# Patient Record
Sex: Male | Born: 1950 | Race: Black or African American | Hispanic: No | Marital: Married | Smoking: Former smoker
Health system: Southern US, Community
[De-identification: ages and names within clinical notes are randomized; demographics above are authoritative.]

## PROBLEM LIST (undated history)

## (undated) DIAGNOSIS — I2510 Atherosclerotic heart disease of native coronary artery without angina pectoris: Secondary | ICD-10-CM

## (undated) DIAGNOSIS — Z7901 Long term (current) use of anticoagulants: Secondary | ICD-10-CM

## (undated) DIAGNOSIS — F419 Anxiety disorder, unspecified: Secondary | ICD-10-CM

## (undated) DIAGNOSIS — M1990 Unspecified osteoarthritis, unspecified site: Secondary | ICD-10-CM

## (undated) DIAGNOSIS — I639 Cerebral infarction, unspecified: Secondary | ICD-10-CM

## (undated) DIAGNOSIS — I10 Essential (primary) hypertension: Secondary | ICD-10-CM

## (undated) DIAGNOSIS — R001 Bradycardia, unspecified: Secondary | ICD-10-CM

## (undated) DIAGNOSIS — K219 Gastro-esophageal reflux disease without esophagitis: Secondary | ICD-10-CM

## (undated) DIAGNOSIS — N529 Male erectile dysfunction, unspecified: Secondary | ICD-10-CM

## (undated) DIAGNOSIS — D126 Benign neoplasm of colon, unspecified: Secondary | ICD-10-CM

## (undated) DIAGNOSIS — A809 Acute poliomyelitis, unspecified: Secondary | ICD-10-CM

## (undated) DIAGNOSIS — M9909 Segmental and somatic dysfunction of abdomen and other regions: Secondary | ICD-10-CM

## (undated) HISTORY — DX: Long term (current) use of anticoagulants: Z79.01

## (undated) HISTORY — DX: Atherosclerotic heart disease of native coronary artery without angina pectoris: I25.10

## (undated) HISTORY — DX: Essential (primary) hypertension: I10

## (undated) HISTORY — DX: Benign neoplasm of colon, unspecified: D12.6

## (undated) HISTORY — DX: Acute poliomyelitis, unspecified: A80.9

## (undated) HISTORY — DX: Bradycardia, unspecified: R00.1

## (undated) HISTORY — DX: Cerebral infarction, unspecified: I63.9

## (undated) HISTORY — DX: Unspecified osteoarthritis, unspecified site: M19.90

## (undated) HISTORY — DX: Segmental and somatic dysfunction of abdomen and other regions: M99.09

## (undated) HISTORY — DX: Male erectile dysfunction, unspecified: N52.9

---

## 2000-10-16 DIAGNOSIS — D126 Benign neoplasm of colon, unspecified: Secondary | ICD-10-CM

## 2000-10-16 HISTORY — PX: COLONOSCOPY: SHX174

## 2000-10-16 HISTORY — DX: Benign neoplasm of colon, unspecified: D12.6

## 2008-06-09 ENCOUNTER — Ambulatory Visit: Payer: Self-pay | Admitting: Cardiology

## 2008-06-12 ENCOUNTER — Ambulatory Visit: Payer: Self-pay | Admitting: Cardiology

## 2008-07-14 ENCOUNTER — Ambulatory Visit: Payer: Self-pay | Admitting: Cardiology

## 2011-02-28 NOTE — Assessment & Plan Note (Signed)
[facility]                          [facility]   George Matthews, George Matthews                        MRN:          [id number]  DATE:07/14/2008                            DOB:          Aug 14, 1951    PRIMARY CARDIOLOGIST:  Jonelle Sidle, MD   REASON FOR VISIT:  Scheduled followup.  Please refer to Dr. Ival Bible  initial consultation note of June 09, 2008, for complete details.   At that time, George Matthews presented for an initial cardiac evaluation in  the setting of several cardiac risk factors, but no prior history of  heart disease.  His symptoms were deemed atypical and, given that he had  had no prior diagnostic ischemic testing, he was referred for an  exercise stress test.  The patient exercised for 7 minutes, had no  associated chest discomfort or significant EKG changes, and there was no  definite evidence of ischemia by perfusion imaging.  Left ventricular  function was normal (EF 60%), with normal wall motion.  There was a  noted small, fixed inferobasal defect consistent with attenuation.   Clinically, George Matthews seems to be doing better.  He has been taking  Prilosec on a p.r.n. basis, as well as having elevated his head on 2  pillows at night.  His symptoms seem to occur only when lying down at  night.  He denies any exertional chest discomfort.   CURRENT MEDICATIONS:  1. Benicar HCT 20/4.5 daily.  2. Crestor 10 daily.  3. Fish oil 1000 q.o.d.  4. Aspirin 81 daily.   PHYSICAL EXAMINATION:  VITAL SIGNS:  Blood pressure 164/88, pulse 64 and  regular, weight 230.  GENERAL:  A 60 year old male, sitting upright, no distress.  HEENT:  Normocephalic, atraumatic.  NECK:  Palpable carotid pulses with soft left supraclavicular bruits.  No JVD.  LUNGS:  Clear to auscultation in all fields.  HEART:  Regular rate and rhythm.  No significant murmurs.  No rubs.  ABDOMEN:  Soft, nontender, intact bowel sounds.  EXTREMITIES:  No  pedal edema.  Brisk bilateral radial pulses.  NEURO:  No focal deficit.   IMPRESSION:  1. Atypical chest pain.      a.     Negative, adequate exercise stress Cardiolite; EF 60%.  2. Hypertension, uncontrolled.  3. Status post remote right eye stroke.  4. Dyslipidemia.   PLAN:  The patient is advised to follow up with Dr. Samuel Jester later  this week, as scheduled, with close monitoring of his blood pressure.  We discussed the importance of treating this with respect to increased  risk of stroke and heart attack.  We will defer further recommendations  regarding either up titration of current dose of Benicar, or addition of  a second agent, to Dr. Charm Barges.  From a cardiac standpoint, no further  workup is indicated.  I did, however, also discuss with George Matthews  whether or not he has had a recent carotid ultrasound.  He apparently  has had a prior study, given his remote stroke.  He is also to review  this with Dr.  Charm Barges, at time of his visit later this week.  The patient  is to otherwise return to Dr. Jonelle Sidle on an as needed basis.  Continued aggressive risk factor modification is recommended.      Rozell Searing, PA-C  Electronically Signed      Jonelle Sidle, MD  Electronically Signed   GS/MedQ  DD: 07/14/2008  DT: 07/15/2008  Job #: [id number]   cc:   Samuel Jester

## 2011-02-28 NOTE — Assessment & Plan Note (Signed)
[facility]                          [facility] NOTE   JAYSHUN, GALENTINE                        MRN:          [id number]  DATE:06/09/2008                            DOB:          06-Apr-1951    REFERRING PHYSICIAN:  Samuel Jester   REASON FOR VISIT:  Cardiac evaluation.   HISTORY OF PRESENT ILLNESS:  Mr. Antonelli is a pleasant 60 year old male  with a history of hypertension, previous stroke, hyperlipidemia, and no  documented history of cardiovascular disease.  He has been on medical  therapy only recently over the last few months.  He reports to me a 5-6  month history of occasional fluttering feeling in the chest as well as  a feeling of indigestion describing a burning/tightness in his  epigastric to lower sternal area.  This appears to be fairly sporadic  and he does not note any specific agitation with exertion.  He does have  baseline dyspnea on exertion at NYHA class II.  He has taken some over-  the-counter Prilosec and noted a general improvement in some of his  indigestion although not all of his symptoms.  He chews tobacco, but  does not smoke cigarettes.  His electrocardiogram shows sinus  bradycardia at 54 beats per minutes with early repolarization changes.  There is no old tracing for comparison.  Looking at his blood work, he  had an LDL of 119 back in July with a BUN and creatinine of 10 and 1.0  respectively.  TSH of 1.19 and a potassium of 5.2.  Hemoglobin was 14.4.  He has not undergone any prior cardiac risk stratification and we spoke  about this some today.   ALLERGIES:  No known drug allergies.   MEDICATIONS:  1. Benicar HCT 20/12.5 mg p.o. daily.  2. Crestor 10 mg p.o. daily.  3. Vitamin D 1.25 mg p.o. daily.  4. Omega-3 supplements 1000 mg p.o. daily.  5. Prilosec OTC p.r.n.   REVIEW OF SYSTEMS:  As described in the history of present illness.  He  does not report any claudication.  He has some general  weakness in his  right leg due to a childhood history of polio.  He has no prolonged  sense of palpitations.  He has had no dizziness or syncope.  Otherwise,  negative.   PAST MEDICAL HISTORY:  Detailed above.  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  He states that it was  recommended he take an aspirin daily, but he has not been doing this.  He has what sounds like a fairly recently documented history of  hypertension and hyperlipidemia.  Also previous back operation.   SOCIAL HISTORY:  The patient is married.  He chews tobacco, although  denies any cigarette use.  He drinks 2-3 cups of caffeinated coffee  daily.  No alcohol use.   Family history was reviewed.  The patient states that his father died at  age 47 with a heart attack as well as a sister at age 63.  Otherwise, no  diabetes.   PHYSICAL EXAMINATION:  VITAL SIGNS:  On examination, blood pressure  148/85, heart rate is 52, weight is 229 pounds.  GENERAL:  The patient is in no acute distress, appearing somewhat older  than stated age.  HEENT:  Conjunctiva was normal.  Oropharynx clear.  NECK:  Supple.  No elevated jugular venous pressure.  No loud carotid  bruits.  No thyromegaly.  LUNGS:  Clear without labored breathing.  CARDIAC:  Exam was a regular rate and rhythm.  Soft systolic murmur at  the base.  No S3, gallop, or pericardial rub.  ABDOMEN:  Soft, nontender.  No obvious hepatomegaly.  EXTREMITIES:  Exhibit no significant pitting edema.  Musculature of the  right lower leg less well-developed than the left.  Distal pulses were 1  to 2+.  SKIN:  Warm and dry.  MUSCULOSKELETAL:  No kyphosis noted.  NEUROPSYCHIATRIC:  The patient was alert x3.  Affect is normal.   IMPRESSION AND RECOMMENDATIONS:  A 60 year old male with a remote  history of stroke and fairly recently diagnosed hypertension and  hyperlipidemia.  His electrocardiogram is rather nonspecific.  He is  having some chest  pain symptoms described as indigestion, although not  entirely resolved with PPI therapy, and he has undergone no prior  cardiac risk stratification.  I have asked him to start taking a coated  baby aspirin daily and we will refer him for an exercise Cardiolite for  risk stratification.  I will have him return to the office to review  this and we can determine the next step.     Jonelle Sidle, MD  Electronically Signed    SGM/MedQ  DD: 06/09/2008  DT: 06/10/2008  Job #: [id number]   cc:   Samuel Jester

## 2012-08-09 ENCOUNTER — Telehealth: Payer: Self-pay

## 2012-08-09 NOTE — Telephone Encounter (Signed)
LMOM to call.

## 2012-08-15 ENCOUNTER — Telehealth: Payer: Self-pay | Admitting: *Deleted

## 2012-08-15 NOTE — Telephone Encounter (Signed)
Mr Swamy called returning your call. Please try his cell phone first when calling him back. Thanks.

## 2012-08-15 NOTE — Telephone Encounter (Signed)
Pt is scheduled for OV on 08/20/2012 at 8:30 Am with LL. ( Hx of tubular adenoma in 2002 and was supposed to have another one in TCS in 5 years.

## 2012-08-19 ENCOUNTER — Other Ambulatory Visit: Payer: Self-pay

## 2012-08-20 ENCOUNTER — Encounter: Payer: Self-pay | Admitting: Gastroenterology

## 2012-08-20 ENCOUNTER — Ambulatory Visit (INDEPENDENT_AMBULATORY_CARE_PROVIDER_SITE_OTHER): Payer: Medicare Other | Admitting: Gastroenterology

## 2012-08-20 VITALS — BP 138/82 | HR 63 | Temp 98.4°F | Ht 75.5 in | Wt 248.6 lb

## 2012-08-20 DIAGNOSIS — Z86010 Personal history of colonic polyps: Secondary | ICD-10-CM

## 2012-08-20 DIAGNOSIS — Z800 Family history of malignant neoplasm of digestive organs: Secondary | ICD-10-CM | POA: Insufficient documentation

## 2012-08-20 DIAGNOSIS — Z860101 Personal history of adenomatous and serrated colon polyps: Secondary | ICD-10-CM | POA: Insufficient documentation

## 2012-08-20 MED ORDER — PEG 3350-KCL-NA BICARB-NACL 420 G PO SOLR: 4000.0000 mL | ORAL | Status: DC

## 2012-08-20 NOTE — Assessment & Plan Note (Signed)
H/O tubular adenoma removed from colon in 2002. FH CRC in father at advanced age. Overdue for surveillance colonoscopy. Colonoscopy in near future with Dr. Darrick Penna.  I have discussed the risks, alternatives, benefits with regards to but not limited to the risk of reaction to medication, bleeding, infection, perforation and the patient is agreeable to proceed. Written consent to be obtained.

## 2012-08-20 NOTE — Progress Notes (Signed)
Primary Care Physician:  Samuel Jester, DO  Primary Gastroenterologist:  Jonette Eva, MD   Chief Complaint  Patient presents with  . Colonoscopy    HPI:  George Matthews is a 61 y.o. male here to schedule surveillance colonoscopy. He had colonoscopy with Dr. Karilyn Cota in 2002. Single tubular adenoma removed. He has FH of CRC, father in his 49s. Patient admits he has been putting off f/u colonoscopy. He was supposed to have five year f/u. He is doing well. No constipation, diarrhea, melena, brbpr, abd pain, gerd, dysphagia, weight loss.   Current Outpatient Prescriptions  Medication Sig Dispense Refill  . hydrochlorothiazide (MICROZIDE) 12.5 MG capsule Take 12.5 mg by mouth daily.      Marland Kitchen olmesartan-hydrochlorothiazide (BENICAR HCT) 20-12.5 MG per tablet Take 1 tablet by mouth daily.        Allergies as of 08/20/2012  . (No Known Allergies)    Past Medical History  Diagnosis Date  . DJD (degenerative joint disease)   . Somatic dysfunction   . HTN (hypertension)   . ED (erectile dysfunction)   . Tubular adenoma of colon 2002  . Stroke     affected vision and speech mildly  . Polio     right leg/arm weakness    Past Surgical History  Procedure Date  . Colonoscopy 2002    Dr. Rehman--> Single diverticulum at the splenic flexure, tiny polyp at transverse colon, tubular adenomatous polyp.    Family History  Problem Relation Age of Onset  . Colon cancer Father     age 107  . Heart attack Father     deceased age 60  . Liver disease Neg Hx     History   Social History  . Marital Status: Married    Spouse Name: N/A    Number of Children: 2  . Years of Education: N/A   Occupational History  . retired, Producer, television/film/video and devp    Social History Main Topics  . Smoking status: Never Smoker   . Smokeless tobacco: Not on file  . Alcohol Use: No  . Drug Use: No  . Sexually Active: Not on file   Other Topics Concern  . Not on file   Social History Narrative  . No narrative  on file      ROS:  General: Negative for anorexia, weight loss, fever, chills, fatigue, weakness. Eyes: Negative for vision changes.  ENT: Negative for hoarseness, difficulty swallowing, nasal congestion. CV: Negative for chest pain, angina, palpitations, dyspnea on exertion, peripheral edema.  Respiratory: Negative for dyspnea at rest, dyspnea on exertion, cough, sputum, wheezing.  GI: See history of present illness. GU:  Negative for dysuria, hematuria, urinary incontinence, urinary frequency, nocturnal urination.  MS: Negative for joint pain, low back pain.  Derm: Negative for rash or itching.  Neuro: Negative for abnormal sensation, seizure, frequent headaches, memory loss, confusion. Chronic weakness right upper/lower extremities from polio as child.  Psych: Negative for anxiety, depression, suicidal ideation, hallucinations.  Endo: Negative for unusual weight change.  Heme: Negative for bruising or bleeding. Allergy: Negative for rash or hives.    Physical Examination:  BP 138/82  Pulse 63  Temp 98.4 F (36.9 C) (Temporal)  Ht 6' 3.5" (1.918 m)  Wt 248 lb 9.6 oz (112.764 kg)  BMI 30.66 kg/m2   General: Well-nourished, well-developed in no acute distress.  Head: Normocephalic, atraumatic.   Eyes: Conjunctiva pink, no icterus. Mouth: Oropharyngeal mucosa moist and pink , no lesions erythema or exudate. Neck:  Supple without thyromegaly, masses, or lymphadenopathy.  Lungs: Clear to auscultation bilaterally.  Heart: Regular rate and rhythm, no murmurs rubs or gallops.  Abdomen: Bowel sounds are normal, nontender, nondistended, no hepatosplenomegaly or masses, no abdominal bruits or hernia , no rebound or guarding.   Rectal: defer Extremities: No lower extremity edema. No clubbing or deformities.  Neuro: Alert and oriented x 4 , grossly normal neurologically.  Skin: Warm and dry, no rash or jaundice.   Psych: Alert and cooperative, normal mood and affect.

## 2012-08-20 NOTE — Patient Instructions (Signed)
We have scheduled you for colonoscopy with Dr. Darrick Penna. Please see separate instructions.

## 2012-08-20 NOTE — Progress Notes (Signed)
Faxed to PCP

## 2012-08-21 ENCOUNTER — Encounter (HOSPITAL_COMMUNITY): Payer: Self-pay | Admitting: Pharmacy Technician

## 2012-08-23 ENCOUNTER — Encounter (HOSPITAL_COMMUNITY)
Admission: RE | Disposition: A | Discharge status: Home / Self Care | Payer: Self-pay | Source: Ambulatory Visit | Attending: Gastroenterology

## 2012-08-23 ENCOUNTER — Ambulatory Visit (HOSPITAL_COMMUNITY)
Admission: RE | Admit: 2012-08-23 | Discharge: 2012-08-23 | Disposition: A | Discharge status: Home / Self Care | Payer: Medicare Other | Source: Ambulatory Visit | Attending: Gastroenterology | Admitting: Gastroenterology

## 2012-08-23 ENCOUNTER — Encounter (HOSPITAL_COMMUNITY): Payer: Self-pay | Admitting: *Deleted

## 2012-08-23 DIAGNOSIS — Z800 Family history of malignant neoplasm of digestive organs: Secondary | ICD-10-CM

## 2012-08-23 DIAGNOSIS — K5730 Diverticulosis of large intestine without perforation or abscess without bleeding: Secondary | ICD-10-CM | POA: Insufficient documentation

## 2012-08-23 DIAGNOSIS — K648 Other hemorrhoids: Secondary | ICD-10-CM | POA: Insufficient documentation

## 2012-08-23 DIAGNOSIS — D126 Benign neoplasm of colon, unspecified: Secondary | ICD-10-CM | POA: Insufficient documentation

## 2012-08-23 DIAGNOSIS — Z860100 Personal history of colon polyps, unspecified: Secondary | ICD-10-CM | POA: Insufficient documentation

## 2012-08-23 DIAGNOSIS — Z1211 Encounter for screening for malignant neoplasm of colon: Secondary | ICD-10-CM

## 2012-08-23 DIAGNOSIS — I10 Essential (primary) hypertension: Secondary | ICD-10-CM | POA: Insufficient documentation

## 2012-08-23 HISTORY — PX: COLONOSCOPY: SHX5424

## 2012-08-23 SURGERY — COLONOSCOPY
Anesthesia: Moderate Sedation

## 2012-08-23 MED ORDER — MEPERIDINE HCL 100 MG/ML IJ SOLN
INTRAMUSCULAR | Status: DC | PRN
  Administered 2012-08-23 (×2): 25 mg via INTRAVENOUS

## 2012-08-23 MED ORDER — MIDAZOLAM HCL 5 MG/5ML IJ SOLN
INTRAMUSCULAR | Status: AC
  Filled 2012-08-23: qty 10

## 2012-08-23 MED ORDER — SODIUM CHLORIDE 0.45 % IV SOLN
INTRAVENOUS | Status: DC
  Administered 2012-08-23: 1000 mL via INTRAVENOUS

## 2012-08-23 MED ORDER — MIDAZOLAM HCL 5 MG/5ML IJ SOLN
INTRAMUSCULAR | Status: DC | PRN
  Administered 2012-08-23: 2 mg via INTRAVENOUS
  Administered 2012-08-23: 1 mg via INTRAVENOUS
  Administered 2012-08-23: 2 mg via INTRAVENOUS

## 2012-08-23 MED ORDER — SIMETHICONE 40 MG/0.6ML PO SUSP
ORAL | Status: DC | PRN
  Administered 2012-08-23: 10:00:00

## 2012-08-23 MED ORDER — MEPERIDINE HCL 100 MG/ML IJ SOLN
INTRAMUSCULAR | Status: AC
  Filled 2012-08-23: qty 1

## 2012-08-23 NOTE — Op Note (Signed)
[facility] [address] [place], [address]   COLONOSCOPY PROCEDURE REPORT  PATIENT: George Matthews, George Matthews  MR#: [id number] BIRTHDATE: 1951-09-18 , 61  yrs. old GENDER: Male ENDOSCOPIST: Jonette Eva, MD REFERRED AO:[id number] Charm Barges PROCEDURE DATE:  08/23/2012 PROCEDURE:   Colonoscopy with biopsy and Colonoscopy with snare polypectomy INDICATIONS:patient's personal history of colon polyps and patient's immediate family history of colon cancer. MEDICATIONS: Demerol 50 mg IV and Versed 5 mg IV  DESCRIPTION OF PROCEDURE:    Physical exam was performed.  Informed consent was obtained from the patient after explaining the benefits, risks, and alternatives to procedure.  The patient was connected to monitor and placed in left lateral position. Continuous oxygen was provided by nasal cannula and IV medicine administered through an indwelling cannula.  After administration of sedation and rectal exam, the patients rectum was intubated and the EC-[id number] ([id number])  colonoscope was advanced under direct visualization to the cecum.  The scope was removed slowly by carefully examining the color, texture, anatomy, and integrity mucosa on the way out.  The patient was recovered in endoscopy and discharged home in satisfactory condition.       COLON FINDINGS: Two sessile polyps measuring 5-6 mm in size were found at the hepatic flexure and in the transverse colon.  A polypectomy was performed with cold forceps and using snare cautery.  , Moderate diverticulosis was noted throughout the entire examined colon.  , and Small internal hemorrhoids were found.  PREP QUALITY: excellent. CECAL W/D TIME: 20 minutes  COMPLICATIONS: None  ENDOSCOPIC IMPRESSION: 1.   Two sessile polyps measuring 5-6 mm in size were found at the hepatic flexure and in the transverse colon; polypectomy was performed with cold forceps and using snare cautery 2.   Moderate diverticulosis was noted  throughout the entire examined colon 3.   Small internal hemorrhoids   RECOMMENDATIONS: AWAIT BIOPSIES HIGH FIBER DIET DO NOT USE RELAFEN AND ALEVE TOGETHER TCS IN 10 YEARS IF SIMPLE ADENOMA REMOVED.  FIRST DEGREE RELATIVE HAD COLON CA AFTER AGE 37.       _______________________________ eSignedJonette Eva, MD 08/23/2012 11:29 AM     PATIENT NAME:  George Matthews MR#: [id number]

## 2012-08-23 NOTE — H&P (Signed)
  Primary Care Physician:  Samuel Jester, DO Primary Gastroenterologist:  Dr. Darrick Penna  Pre-Procedure History & Physical: HPI:  George Matthews is a 61 y.o. male here for FAMILY Hx COLON CA-FATHER HAD COLON CA AGE > 60.   Past Medical History  Diagnosis Date  . DJD (degenerative joint disease)   . Somatic dysfunction   . HTN (hypertension)   . ED (erectile dysfunction)   . Tubular adenoma of colon 2002  . Stroke     affected vision and speech mildly  . Polio     right leg/arm weakness    Past Surgical History  Procedure Date  . Colonoscopy 2002    Dr. Rehman--> Single diverticulum at the splenic flexure, tiny polyp at transverse colon, tubular adenomatous polyp.    Prior to Admission medications   Medication Sig Start Date End Date Taking? Authorizing Provider  hydrochlorothiazide (MICROZIDE) 12.5 MG capsule Take 12.5 mg by mouth daily.   Yes Historical Provider, MD  nabumetone (RELAFEN) 500 MG tablet Take 500 mg by mouth 2 (two) times daily as needed. pain   Yes Historical Provider, MD  naproxen sodium (ALEVE) 220 MG tablet Take 220 mg by mouth 2 (two) times daily as needed. Pain   Yes Historical Provider, MD    Allergies as of 08/20/2012  . (No Known Allergies)    Family History  Problem Relation Age of Onset  . Colon cancer Father     age 50  . Heart attack Father     deceased age 51  . Liver disease Neg Hx     History   Social History  . Marital Status: Married    Spouse Name: N/A    Number of Children: 2  . Years of Education: N/A   Occupational History  . retired, Producer, television/film/video and devp    Social History Main Topics  . Smoking status: Never Smoker   . Smokeless tobacco: Not on file  . Alcohol Use: No  . Drug Use: No  . Sexually Active: Not on file   Other Topics Concern  . Not on file   Social History Narrative  . No narrative on file    Review of Systems: See HPI, otherwise negative ROS   Physical Exam: BP 156/79  Pulse 58  Temp 98.1 F  (36.7 C) (Oral)  Resp 20  SpO2 96% General:   Alert,  pleasant and cooperative in NAD Head:  Normocephalic and atraumatic. Neck:  Supple; Lungs:  Clear throughout to auscultation.    Heart:  Regular rate and rhythm. Abdomen:  Soft, nontender and nondistended. Normal bowel sounds, without guarding, and without rebound.   Neurologic:  Alert and  oriented x4;  grossly normal neurologically.  Impression/Plan:    FAMILY Hx COLON CA-FATHER HAD COLON CA AGE > 60.  Plan: 1. TCS TODAY

## 2012-08-24 NOTE — Progress Notes (Signed)
REVIEWED.  

## 2012-08-26 NOTE — Telephone Encounter (Signed)
Pt had OV with LL on 08/20/2012 and TCS with SF on 08/23/2012.

## 2012-08-27 ENCOUNTER — Telehealth: Payer: Self-pay | Admitting: *Deleted

## 2012-08-27 ENCOUNTER — Telehealth: Payer: Self-pay | Admitting: Gastroenterology

## 2012-08-27 ENCOUNTER — Encounter (HOSPITAL_COMMUNITY): Payer: Self-pay | Admitting: Gastroenterology

## 2012-08-27 NOTE — Telephone Encounter (Signed)
Please call pt. He had A simple adenoma removed from hIS colon.    HE SHOULD NOT TAKE RELAFEN AND ALEVE TOGETHER. THEY BOTH CAN CAUSE ULCERS, INTERNAL BLEEDING, AND KIDNEY FAILURE.  FOLLOW A HIGH FIBER DIET. AVOID ITEMS THAT CAUSE BLOATING.   Next colonoscopy in 10 years.

## 2012-08-27 NOTE — Telephone Encounter (Signed)
L/M to call.

## 2012-08-27 NOTE — Telephone Encounter (Signed)
George Matthews called returning your call, please call him on his cell phone. Thanks.

## 2012-08-27 NOTE — Telephone Encounter (Signed)
Returned pt's call and informed of results.

## 2012-08-27 NOTE — Telephone Encounter (Signed)
Path faxed to PCP, recall made 

## 2012-08-27 NOTE — Telephone Encounter (Signed)
Called and informed pt of results (see results phone call).

## 2014-06-25 ENCOUNTER — Emergency Department (HOSPITAL_COMMUNITY): Payer: Medicare Other

## 2014-06-25 ENCOUNTER — Encounter (HOSPITAL_COMMUNITY): Payer: Self-pay | Admitting: Emergency Medicine

## 2014-06-25 ENCOUNTER — Inpatient Hospital Stay (HOSPITAL_COMMUNITY)
Admission: EM | Admit: 2014-06-25 | Discharge: 2014-06-29 | DRG: 167 | Disposition: A | Discharge status: Home / Self Care | Payer: Medicare Other | Attending: Pulmonary Disease | Admitting: Pulmonary Disease

## 2014-06-25 DIAGNOSIS — Z8673 Personal history of transient ischemic attack (TIA), and cerebral infarction without residual deficits: Secondary | ICD-10-CM | POA: Diagnosis not present

## 2014-06-25 DIAGNOSIS — R0602 Shortness of breath: Secondary | ICD-10-CM | POA: Diagnosis present

## 2014-06-25 DIAGNOSIS — Z7901 Long term (current) use of anticoagulants: Secondary | ICD-10-CM

## 2014-06-25 DIAGNOSIS — R29898 Other symptoms and signs involving the musculoskeletal system: Secondary | ICD-10-CM | POA: Diagnosis present

## 2014-06-25 DIAGNOSIS — R911 Solitary pulmonary nodule: Secondary | ICD-10-CM | POA: Diagnosis present

## 2014-06-25 DIAGNOSIS — I824Y9 Acute embolism and thrombosis of unspecified deep veins of unspecified proximal lower extremity: Secondary | ICD-10-CM | POA: Diagnosis present

## 2014-06-25 DIAGNOSIS — Z87891 Personal history of nicotine dependence: Secondary | ICD-10-CM | POA: Diagnosis not present

## 2014-06-25 DIAGNOSIS — Z8249 Family history of ischemic heart disease and other diseases of the circulatory system: Secondary | ICD-10-CM | POA: Diagnosis not present

## 2014-06-25 DIAGNOSIS — I2692 Saddle embolus of pulmonary artery without acute cor pulmonale: Secondary | ICD-10-CM | POA: Diagnosis present

## 2014-06-25 DIAGNOSIS — Z860101 Personal history of adenomatous and serrated colon polyps: Secondary | ICD-10-CM

## 2014-06-25 DIAGNOSIS — B91 Sequelae of poliomyelitis: Secondary | ICD-10-CM | POA: Diagnosis not present

## 2014-06-25 DIAGNOSIS — J9600 Acute respiratory failure, unspecified whether with hypoxia or hypercapnia: Secondary | ICD-10-CM

## 2014-06-25 DIAGNOSIS — Z800 Family history of malignant neoplasm of digestive organs: Secondary | ICD-10-CM | POA: Diagnosis not present

## 2014-06-25 DIAGNOSIS — N529 Male erectile dysfunction, unspecified: Secondary | ICD-10-CM | POA: Diagnosis present

## 2014-06-25 DIAGNOSIS — Z86010 Personal history of colonic polyps: Secondary | ICD-10-CM

## 2014-06-25 DIAGNOSIS — I10 Essential (primary) hypertension: Secondary | ICD-10-CM | POA: Diagnosis present

## 2014-06-25 DIAGNOSIS — I2699 Other pulmonary embolism without acute cor pulmonale: Secondary | ICD-10-CM

## 2014-06-25 DIAGNOSIS — J9601 Acute respiratory failure with hypoxia: Secondary | ICD-10-CM

## 2014-06-25 DIAGNOSIS — I82409 Acute embolism and thrombosis of unspecified deep veins of unspecified lower extremity: Secondary | ICD-10-CM

## 2014-06-25 HISTORY — DX: Other pulmonary embolism without acute cor pulmonale: I26.99

## 2014-06-25 HISTORY — DX: Acute embolism and thrombosis of unspecified deep veins of unspecified lower extremity: I82.409

## 2014-06-25 LAB — BLOOD GAS, ARTERIAL
Acid-base deficit: 0.5 mmol/L (ref 0.0–2.0)
Bicarbonate: 23.3 mEq/L (ref 20.0–24.0)
Drawn by: 38235
FIO2: 1 %
O2 Saturation: 90.5 %
Patient temperature: 37
TCO2: 20.6 mmol/L (ref 0–100)
pCO2 arterial: 36.4 mmHg (ref 35.0–45.0)
pH, Arterial: 7.423 (ref 7.350–7.450)
pO2, Arterial: 60.6 mmHg — ABNORMAL LOW (ref 80.0–100.0)

## 2014-06-25 LAB — CBC WITH DIFFERENTIAL/PLATELET
Basophils Absolute: 0 10*3/uL (ref 0.0–0.1)
Basophils Relative: 1 % (ref 0–1)
Eosinophils Absolute: 0.3 10*3/uL (ref 0.0–0.7)
Eosinophils Relative: 5 % (ref 0–5)
HCT: 43.4 % (ref 39.0–52.0)
Hemoglobin: 14.7 g/dL (ref 13.0–17.0)
Lymphocytes Relative: 36 % (ref 12–46)
Lymphs Abs: 1.9 10*3/uL (ref 0.7–4.0)
MCH: 28.6 pg (ref 26.0–34.0)
MCHC: 33.9 g/dL (ref 30.0–36.0)
MCV: 84.4 fL (ref 78.0–100.0)
Monocytes Absolute: 0.4 10*3/uL (ref 0.1–1.0)
Monocytes Relative: 7 % (ref 3–12)
Neutro Abs: 2.7 10*3/uL (ref 1.7–7.7)
Neutrophils Relative %: 51 % (ref 43–77)
Platelets: 228 10*3/uL (ref 150–400)
RBC: 5.14 MIL/uL (ref 4.22–5.81)
RDW: 14.4 % (ref 11.5–15.5)
WBC: 5.3 10*3/uL (ref 4.0–10.5)

## 2014-06-25 LAB — BASIC METABOLIC PANEL
Anion gap: 13 (ref 5–15)
BUN: 16 mg/dL (ref 6–23)
CO2: 26 mEq/L (ref 19–32)
Calcium: 9.2 mg/dL (ref 8.4–10.5)
Chloride: 101 mEq/L (ref 96–112)
Creatinine, Ser: 1.11 mg/dL (ref 0.50–1.35)
GFR calc Af Amer: 79 mL/min — ABNORMAL LOW (ref >90)
GFR calc non Af Amer: 68 mL/min — ABNORMAL LOW (ref >90)
Glucose, Bld: 93 mg/dL (ref 70–99)
Potassium: 3.8 mEq/L (ref 3.7–5.3)
Sodium: 140 mEq/L (ref 137–147)

## 2014-06-25 LAB — TROPONIN I: Troponin I: <0.3 ng/mL (ref <0.30)

## 2014-06-25 LAB — PRO B NATRIURETIC PEPTIDE: Pro B Natriuretic peptide (BNP): 80.1 pg/mL (ref 0–125)

## 2014-06-25 LAB — PROTIME-INR
INR: 0.97 (ref 0.00–1.49)
Prothrombin Time: 12.9 seconds (ref 11.6–15.2)

## 2014-06-25 MED ORDER — HEPARIN (PORCINE) IN NACL 100-0.45 UNIT/ML-% IJ SOLN
1700.0000 [IU]/h | INTRAMUSCULAR | Status: DC
  Administered 2014-06-25 – 2014-06-26 (×2): 1700 [IU]/h via INTRAVENOUS
  Filled 2014-06-25 (×4): qty 250

## 2014-06-25 MED ORDER — IOHEXOL 350 MG/ML SOLN
100.0000 mL | Freq: Once | INTRAVENOUS | Status: AC | PRN
  Administered 2014-06-25: 100 mL via INTRAVENOUS

## 2014-06-25 MED ORDER — HEPARIN SODIUM (PORCINE) 5000 UNIT/ML IJ SOLN
4000.0000 [IU] | Freq: Once | INTRAMUSCULAR | Status: AC
  Administered 2014-06-25: 4000 [IU] via INTRAVENOUS

## 2014-06-25 MED ORDER — ALBUTEROL SULFATE (2.5 MG/3ML) 0.083% IN NEBU
2.5000 mg | INHALATION_SOLUTION | RESPIRATORY_TRACT | Status: DC | PRN
  Administered 2014-06-25: 2.5 mg via RESPIRATORY_TRACT
  Filled 2014-06-25: qty 3

## 2014-06-25 NOTE — ED Notes (Addendum)
Per [employer] - pt from home w/ c/o shortness of breath x1 month, progressively worse this past week - EMS found pt to have pulse ox in 80s on RA on arrival to scene - pt currently on NRB mask and pulse ox up to low 90s. Pt also hypertensive in 200s - pt given x3 nitro en route, BP down to 158/90 - EMS report lung sounds w/ crackles. Pt A&Ox4 on arrival to department. Pt also c/o dizziness and as if he were going to pass out today.

## 2014-06-25 NOTE — ED Provider Notes (Addendum)
CSN: [id number]     Arrival date & time 06/25/14  1854 History   First MD Initiated Contact with Patient 06/25/14 1857     Chief Complaint  Patient presents with  . Shortness of Breath      HPI  Patient presents with complaint of shortness of breath.  80 percent saturations upon arrival of paramedics tonight. Was hypertensive with blood pressure over 220. Given nitroglycerin x3. Presents here with a pressure of 136.  Patient states that he's been feeling occasionally dizzy and short of breath for about the last week.  No chest pain. No cough or sputum production. Today he states he leaned over to grab something on the floor, when he became very suddenly short of breath and felt dizzy. Denies chest pain. Describes the dizziness as though he "thought she was going to pass out". Denies vertigo. No history of known lung disease. He did smoke for over 10 years. However he quit in 1973.  Has never been diagnosed with asthma or emphysema. No history of DVT or PE. No family history of DVT or PE. No history of cancer. 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  No recent surgeries or procedures. No prolonged immobilization, cast splints, no fractures.  Past Medical History  Diagnosis Date  . DJD (degenerative joint disease)   . Somatic dysfunction   . HTN (hypertension)   . ED (erectile dysfunction)   . Tubular adenoma of colon 2002  . Stroke     affected vision and speech mildly  . Polio     right leg/arm weakness   Past Surgical History  Procedure Laterality Date  . Colonoscopy  2002    Dr. Rehman--> Single diverticulum at the splenic flexure, tiny polyp at transverse colon, tubular adenomatous polyp.  . Colonoscopy  08/23/2012    Procedure: COLONOSCOPY;  Surgeon: Danie Binder, MD;  Location: [facility];  Service: Endoscopy;  Laterality: N/A;  10:15   Family History  Problem Relation Age of Onset  . Colon cancer Father     age 35  . Heart attack Father     deceased  age 39  . Liver disease Neg Hx    History  Substance Use Topics  . Smoking status: Former Smoker    Quit date: 06/25/1972  . Smokeless tobacco: Not on file  . Alcohol Use: No    Review of Systems  Constitutional: Positive for fatigue. Negative for fever, chills, diaphoresis and appetite change.  HENT: Negative for mouth sores, sore throat and trouble swallowing.   Eyes: Negative for visual disturbance.  Respiratory: Positive for shortness of breath. Negative for cough, chest tightness and wheezing.   Cardiovascular: Negative for chest pain.  Gastrointestinal: Negative for nausea, vomiting, abdominal pain, diarrhea and abdominal distention.  Endocrine: Negative for polydipsia, polyphagia and polyuria.  Genitourinary: Negative for dysuria, frequency and hematuria.  Musculoskeletal: Negative for gait problem.  Skin: Negative for color change, pallor and rash.  Neurological: Positive for dizziness and light-headedness. Negative for syncope and headaches.  Hematological: Does not bruise/bleed easily.  Psychiatric/Behavioral: Negative for behavioral problems and confusion.      Allergies  Review of patient's allergies indicates no known allergies.  Home Medications   Prior to Admission medications   Medication Sig Start Date End Date Taking? Authorizing Provider  acetaminophen (TYLENOL) 500 MG tablet Take 500 mg by mouth every 6 (six) hours as needed for mild pain or moderate pain.   Yes Historical Provider, MD  hydrochlorothiazide (MICROZIDE) 12.5 MG capsule Take 12.5 mg by mouth every morning.    Yes Historical Provider, MD  ibuprofen (ADVIL,MOTRIN) 200 MG tablet Take 200 mg by mouth every 6 (six) hours as needed for mild pain or moderate pain.   Yes Historical Provider, MD   BP 147/91  Pulse 80  Temp(Src) 97.8 F (36.6 C) (Oral)  Resp 13  SpO2 93% Physical Exam  Constitutional: He is oriented to person, place, and time. He appears well-developed and well-nourished. No  distress.  HENT:  Head: Normocephalic.  Eyes: Conjunctivae are normal. Pupils are equal, round, and reactive to light. No scleral icterus.  Neck: Normal range of motion. Neck supple. No thyromegaly present.  Cardiovascular: Normal rate and regular rhythm.  Exam reveals no gallop and no friction rub.   No murmur heard. Pulmonary/Chest: Effort normal and breath sounds normal. No respiratory distress. He has no wheezes. He has no rales.  Diminished basilar breath sounds. Globally diminished breath sounds. However no marked prolongation. No increased work of  breathing.  Abdominal: Soft. Bowel sounds are normal. He exhibits no distension. There is no tenderness. There is no rebound.  Musculoskeletal: Normal range of motion.  His lower legs are asymmetric, left greater than right. He states it is been this way since he was a kid and had polio. No tenderness redness or cording or pain to either leg.  Neurological: He is alert and oriented to person, place, and time.  Skin: Skin is warm and dry. No rash noted.  Psychiatric: He has a normal mood and affect. His behavior is normal.    ED Course  CRITICAL CARE Performed by: [place], [place] by: Tanna Furry Total critical care time: 60 minutes Critical care start time: 06/25/2014 9:42 PM Critical care end time: 06/25/2014 10:42 PM Critical care time was exclusive of separately billable procedures and treating other patients. Critical care was necessary to treat or prevent imminent or life-threatening deterioration of the following conditions: respiratory failure (pulmonary embolus). Critical care was time spent personally by me on the following activities: blood draw for specimens, development of treatment plan with patient or surrogate, discussions with consultants, interpretation of cardiac output measurements, evaluation of patient's response to treatment, examination of patient, obtaining history from patient or surrogate, ordering and  performing treatments and interventions, ordering and review of laboratory studies, ordering and review of radiographic studies, pulse oximetry and re-evaluation of patient's condition.   (including critical care time) Labs Review Labs Reviewed  BASIC METABOLIC PANEL - Abnormal; Notable for the following:    GFR calc non Af Amer 68 (*)    GFR calc Af Amer 79 (*)    All other components within normal limits  BLOOD GAS, ARTERIAL - Abnormal; Notable for the following:    pO2, Arterial 60.6 (*)    All other components within normal limits  CBC WITH DIFFERENTIAL  TROPONIN I  PRO B NATRIURETIC PEPTIDE    Imaging Review Dg Chest 2 View  06/25/2014   CLINICAL DATA:  Shortness of breath  EXAM: CHEST  2 VIEW  COMPARISON:  None.  FINDINGS: The heart size and mediastinal contours are within normal limits. Both lungs are clear. The visualized skeletal structures are unremarkable.  IMPRESSION: No active cardiopulmonary disease.   Electronically Signed   By: Inez Catalina M.D.   On: 06/25/2014 21:18   Ct Angio Chest Pe W/cm &/or Wo Cm  06/25/2014   CLINICAL DATA:  Progressive shortness of breath  EXAM: CT ANGIOGRAPHY CHEST  WITH CONTRAST  TECHNIQUE: Multidetector CT imaging of the chest was performed using the standard protocol during bolus administration of intravenous contrast. Multiplanar CT image reconstructions and MIPs were obtained to evaluate the vascular anatomy.  CONTRAST:  124mL OMNIPAQUE IOHEXOL 350 MG/ML SOLN  COMPARISON:  Chest radiograph dated 06/25/2014  FINDINGS: Bilateral pulmonary emboli with a small amount of saddle embolus at the bifurcation (series 8/image 143), extensive clot in the left main pulmonary artery (series 8/ image 125), and additional lobe are thrombus of the bifurcation of the right upper, middle, and lower pulmonary arteries (series 8/image 169). Overall clot burden is moderate to large.  Elevated RV to LV ratio (1.28), raising the possibility of right heart strain.  1.5 cm  patchy subpleural nodule at the left lung base (series 5/ image 73). No pleural effusion or pneumothorax.  Visualized thyroid is unremarkable.  Heart is normal in size. No pericardial effusion. Coronary atherosclerosis. Atherosclerotic calcifications of the aortic arch.  Visualized upper abdomen is unremarkable.  Degenerative changes of the visualized thoracolumbar spine.  Review of the MIP images confirms the above findings.  IMPRESSION: Bilateral pulmonary emboli, as above. Overall clot burden is moderate to large. Elevated RV to LV ratio, raising the possibility of right heart strain.  Positive for acute PE with CT evidence of right heart strain (RV/LV Ratio = 1.28) consistent with at least submassive (intermediate risk) PE. The presence of right heart strain has been associated with an increased risk of morbidity and mortality.  1.5 cm subpleural nodule at the left lung base. Initial follow-up CT chest is suggested in 3 months.  This recommendation follows the consensus statement: Guidelines for Management of Small Pulmonary Nodules Detected on CT Scans: A Statement from the [place] as published in Radiology 2005; 237:395-400.  Critical value/emergent results were called by telephone at the time of interpretation on 06/25/2014 at 10:15 pm to Dr. Tanna Furry , who verbally acknowledged these results.   Electronically Signed   By: Julian Hy M.D.   On: 06/25/2014 22:18     EKG Interpretation   Date/Time:  Thursday June 25 2014 19:07:58 EDT Ventricular Rate:  94 PR Interval:  162 QRS Duration: 111 QT Interval:  391 QTC Calculation: 489 R Axis:   -36 Text Interpretation:  Sinus rhythm No ectopy Incomplete RBBB Confirmed by  Jeneen Rinks  MD, [place] ([id number]) on 06/25/2014 7:16:45 PM      MDM   Final diagnoses:  Saddle pulmonary embolus    EKG shows a normal sinus rhythm. Normal troponin. Normal BMP. Chest x-ray shows no infiltrates effusions or other acute changes. Markedly  hypertensive before arrival. Given nitroglycerin. Normotensive here. However, no signs of congestive heart failure. Plan is nebulized albuterol, then  reevaluation. CT angiogram to rule out PE.  22:17:  CT  shows large bilateral central pulmonary emboli, with saddle portion Increased RV and LV ratio. He is requiring between 6 and 8 L O2. Not tachycardic. Not hypotensive. No increased work of breathing. Call placed to intensivist.   Heparin bolus and drip ordered, and initiated in ED.    Tanna Furry, MD 06/25/14 2241  Tanna Furry, MD 06/25/14 [phone]

## 2014-06-25 NOTE — H&P (Signed)
PULMONARY / CRITICAL CARE MEDICINE   Name: George Matthews MRN: [id number] DOB: 15-May-1951    ADMISSION DATE:  06/25/2014 CONSULTATION DATE:  06/25/2014  REFERRING MD :  EDP  CHIEF COMPLAINT:  SOB  INITIAL PRESENTATION: 63 y.o. M brought to [facility] ED on 9/10 with SOB x 1 month, progressively worse over past week.In ED, found to have bilateral PE with possible right heart strain.  PCCM was consulted and pt transferred to [facility] ICU.  STUDIES:  CTA Chest 9/10 >>> bilateral PE with mod - large clot burden, elevated RV to LV ratio, possible right heart strain.  1.5cm subpleural nodule at left lung base.  SIGNIFICANT EVENTS: 9/10 - presented to [facility] ED with SOB x 1 month, found to have b/l PE, transferred to [facility] ICU.   HISTORY OF PRESENT ILLNESS:  George Matthews is a 63 y.o. M with PMH as outlined below who presented to the [facility] ED via EMS on 9/10 with SOB.  Symptoms had been going on for 1 month but had progressively worsened over the past week.  On EMS arrival, SpO2 was in 80's on RA, improved to low 90's with NRB.  Pt was also hypertensive with SBP in 200's.  He was given nitro x 3 with improvement in SBP to 150's. Pt complained that in addition to SOB, he had been feeling dizzy for the past week.  Earlier in teh day, he leaned over to pick up something from the floor when he became much more SOB and dizzy to the point that he felt he was about to pass out. He denies any headache, chest pain, cough, hemoptysis, abd pain, N/V/D.   No similar symptoms in the past.  Is an ex smoker of > 10 years, quite in 1973.  Denies any history of VTE, personal or family hx of cancers or coagulopathy, recent long trips or periods of immobility. He does have hx of polio that has resulted in mild right UE and LE weakness.  His LE is edematous compared to RE; however, he reports that it has been this way since he was a child and had polio. In ED, CTA chest revealed bilateral PE with mod-large clot burden and possible right heart  strain.  PCCM was consulted for transfer to [facility] ICU.  PAST MEDICAL HISTORY :  Past Medical History  Diagnosis Date  . DJD (degenerative joint disease)   . Somatic dysfunction   . HTN (hypertension)   . ED (erectile dysfunction)   . Tubular adenoma of colon 2002  . Stroke     affected vision and speech mildly  . Polio     right leg/arm weakness   Past Surgical History  Procedure Laterality Date  . Colonoscopy  2002    Dr. Rehman--> Single diverticulum at the splenic flexure, tiny polyp at transverse colon, tubular adenomatous polyp.  . Colonoscopy  08/23/2012    Procedure: COLONOSCOPY;  Surgeon: Danie Binder, MD;  Location[facility];  Service: Endoscopy;  Laterality: N/A;  10:15   Prior to Admission medications   Medication Sig Start Date End Date Taking? Authorizing Provider  acetaminophen (TYLENOL) 500 MG tablet Take 500 mg by mouth every 6 (six) hours as needed for mild pain or moderate pain.   Yes Historical Provider, MD  hydrochlorothiazide (MICROZIDE) 12.5 MG capsule Take 12.5 mg by mouth every morning.    Yes Historical Provider, MD  ibuprofen (ADVIL,MOTRIN) 200 MG tablet Take 200 mg by mouth every 6 (six) hours as needed  for mild pain or moderate pain.   Yes Historical Provider, MD   No Known Allergies  FAMILY HISTORY:  Family History  Problem Relation Age of Onset  . Colon cancer Father     age 9  . Heart attack Father     deceased age 51  . Liver disease Neg Hx    SOCIAL HISTORY:  reports that he quit smoking about 42 years ago. He does not have any smokeless tobacco history on file. He reports that he does not drink alcohol or use illicit drugs.  REVIEW OF SYSTEMS:   All negative; except for those that are bolded, which indicate positives.  Constitutional: weight loss, weight gain, night sweats, fevers, chills, fatigue, weakness.  HEENT: headaches, sore throat, sneezing, nasal congestion, post nasal drip, difficulty swallowing, tooth/dental problems,  visual complaints, visual changes, ear aches. Neuro: difficulty with speech, weakness, numbness, ataxia, dizziness. CV:  chest pain, orthopnea, PND, swelling in lower extremities, dizziness, palpitations, syncope.  Resp: cough, hemoptysis, dyspnea, wheezing. GI  heartburn, indigestion, abdominal pain, nausea, vomiting, diarrhea, constipation, change in bowel habits, loss of appetite, hematemesis, melena, hematochezia.  GU: dysuria, change in color of urine, urgency or frequency, flank pain, hematuria. MSK: joint pain or swelling, decreased range of motion. Psych: change in mood or affect, depression, anxiety, suicidal ideations, homicidal ideations. Skin: rash, itching, bruising.   SUBJECTIVE: On NRB, SpO2 93 - 96%.  VITAL SIGNS: Temp:  [97.8 F (36.6 C)] 97.8 F (36.6 C) (09/10 1904) Pulse Rate:  [79-95] 84 (09/10 2230) Resp:  [12-23] 23 (09/10 2230) BP: (134-167)/(76-122) 167/86 mmHg (09/10 2230) SpO2:  [82 %-98 %] 98 % (09/10 2230) Weight:  [104.951 kg (231 lb 6 oz)] 104.951 kg (231 lb 6 oz) (09/10 2243) HEMODYNAMICS:   VENTILATOR SETTINGS:   INTAKE / OUTPUT: Intake/Output   None     PHYSICAL EXAMINATION: General: Pleasant male, in NAD. Neuro: A&O x 3, non-focal.  HEENT: [place]/AT. PERRL, sclerae anicteric. Cardiovascular: RRR, no M/R/G.  Lungs: Respirations even and unlabored.  CTA bilaterally, No W/R/R. On NRB. Abdomen: BS x 4, soft, NT/ND.  Musculoskeletal: No gross deformities, LLE edema > RLE (note, pt states it has always been this way since he was a child). Skin: Intact, warm, no rashes.  LABS:  CBC  Recent Labs Lab 06/25/14 1937  WBC 5.3  HGB 14.7  HCT 43.4  PLT 228   Coag's  Recent Labs Lab 06/25/14 2251  INR 0.97   BMET  Recent Labs Lab 06/25/14 1937  NA 140  K 3.8  CL 101  CO2 26  BUN 16  CREATININE 1.11  GLUCOSE 93   Electrolytes  Recent Labs Lab 06/25/14 1937  CALCIUM 9.2   Sepsis Markers No results found for this basename:  LATICACIDVEN, PROCALCITON, O2SATVEN,  in the last 168 hours ABG  Recent Labs Lab 06/25/14 2157  PHART 7.423  PCO2ART 36.4  PO2ART 60.6*   Liver Enzymes No results found for this basename: AST, ALT, ALKPHOS, BILITOT, ALBUMIN,  in the last 168 hours Cardiac Enzymes  Recent Labs Lab 06/25/14 1937  TROPONINI <0.30  PROBNP 80.1   Glucose No results found for this basename: GLUCAP,  in the last 168 hours  Imaging Dg Chest 2 View  06/25/2014   CLINICAL DATA:  Shortness of breath  EXAM: CHEST  2 VIEW  COMPARISON:  None.  FINDINGS: The heart size and mediastinal contours are within normal limits. Both lungs are clear. The visualized skeletal structures are unremarkable.  IMPRESSION:  No active cardiopulmonary disease.   Electronically Signed   By: Inez Catalina M.D.   On: 06/25/2014 21:18   Ct Angio Chest Pe W/cm &/or Wo Cm  06/25/2014   CLINICAL DATA:  Progressive shortness of breath  EXAM: CT ANGIOGRAPHY CHEST WITH CONTRAST  TECHNIQUE: Multidetector CT imaging of the chest was performed using the standard protocol during bolus administration of intravenous contrast. Multiplanar CT image reconstructions and MIPs were obtained to evaluate the vascular anatomy.  CONTRAST:  132mL OMNIPAQUE IOHEXOL 350 MG/ML SOLN  COMPARISON:  Chest radiograph dated 06/25/2014  FINDINGS: Bilateral pulmonary emboli with a small amount of saddle embolus at the bifurcation (series 8/image 143), extensive clot in the left main pulmonary artery (series 8/ image 125), and additional lobe are thrombus of the bifurcation of the right upper, middle, and lower pulmonary arteries (series 8/image 169). Overall clot burden is moderate to large.  Elevated RV to LV ratio (1.28), raising the possibility of right heart strain.  1.5 cm patchy subpleural nodule at the left lung base (series 5/ image 73). No pleural effusion or pneumothorax.  Visualized thyroid is unremarkable.  Heart is normal in size. No pericardial effusion. Coronary  atherosclerosis. Atherosclerotic calcifications of the aortic arch.  Visualized upper abdomen is unremarkable.  Degenerative changes of the visualized thoracolumbar spine.  Review of the MIP images confirms the above findings.  IMPRESSION: Bilateral pulmonary emboli, as above. Overall clot burden is moderate to large. Elevated RV to LV ratio, raising the possibility of right heart strain.  Positive for acute PE with CT evidence of right heart strain (RV/LV Ratio = 1.28) consistent with at least submassive (intermediate risk) PE. The presence of right heart strain has been associated with an increased risk of morbidity and mortality.  1.5 cm subpleural nodule at the left lung base. Initial follow-up CT chest is suggested in 3 months.  This recommendation follows the consensus statement: Guidelines for Management of Small Pulmonary Nodules Detected on CT Scans: A Statement from the [place] as published in Radiology 2005; 237:395-400.  Critical value/emergent results were called by telephone at the time of interpretation on 06/25/2014 at 10:15 pm to Dr. Tanna Furry , who verbally acknowledged these results.   Electronically Signed   By: Julian Hy M.D.   On: 06/25/2014 22:18    ASSESSMENT / PLAN:  PULMONARY A: Bilateral PE with mod - large clot burden and CT evidence of right heart strain Pulmonary nodule - 1.5cm left lung base P:   Continue systemic heparin for now. Goal INR >2. Given increased O2 demands, would likely benefit from catheter directed tPA. F/u on echo. F/u on LE dopplers. F/u on hypercoagulable panel: factor V leiden, lupus anticoagulant, homocysteine. OUtpt FU of nodule - PET vs  f/u CT in 3 - 6 months   CARDIOVASCULAR A:  HTN P:  Lopressor PRN. Recheck troponin. Hold outpatient HCTZ, given possible RV strain would avoid decreasing preload.  RENAL A:   No acute issues P:   NS @ 75. BMP in AM.  GASTROINTESTINAL A:   Nutrition P:   Advance diet as  tolerated.  HEMATOLOGIC A:   VTE Prophylaxis P:  Systemic heparin. No SCD's until LE dopplers r/o DVT. CBC in AM.  INFECTIOUS A:   No evidence of infection P:   Monitor clinically.  ENDOCRINE A:   No known issues   P:   Monitor glucose on BMP.  NEUROLOGIC A:   No acute issues P:   No intervention required.  TODAY'S SUMMARY: 63 y.o. M transferred from [facility] ED with bilateral PE, CT evidence of right heart strain.  Echo and LE dopplers pending.  Would benefit from catheter directed tPA.   Montey Hora, [facility] Pgr: [phone]  or [phone] 06/25/2014, 11:53 PM  Attending note - Sub massive PE with severe hypoxia requiring NRB. Hypertensive but should be controlled with meds. Discussed risks of thrombolytic vs heparin alone. Low risk of bleeding complication except for hypertension which should be easy to control.Would suggest catheter directed lysis. I discussed risks & benefit of the procedure with pt & family. Outpt FU of nodule  I have personally obtained a history, examined the patient, evaluated laboratory and imaging results, formulated the assessment and plan and placed orders. CRITICAL CARE: The patient is critically ill with multiple organ systems failure and requires high complexity decision making for assessment and support, frequent evaluation and titration of therapies, application of advanced monitoring technologies and extensive interpretation of multiple databases. Critical Care Time devoted to patient care services described in this note is 60 minutes.    Rigoberto Noel MD

## 2014-06-25 NOTE — Progress Notes (Signed)
ANTICOAGULATION CONSULT NOTE - Initial Consult  Pharmacy Consult for Heparin Indication: pulmonary embolus  No Known Allergies  Patient Measurements: Height: 6' 3.59" (192 cm) Weight: 231 lb 6 oz (104.951 kg) IBW/kg (Calculated) : 85.86 Heparin Dosing Weight: 105 kg  Vital Signs: Temp: 97.8 F (36.6 C) (09/10 1904) BP: 167/86 mmHg (09/10 2230) Pulse Rate: 84 (09/10 2230)  Labs:  Recent Labs  06/25/14 1937  HGB 14.7  HCT 43.4  PLT 228  CREATININE 1.11  TROPONINI <0.30    Estimated Creatinine Clearance: 87.7 ml/min (by C-G formula based on Cr of 1.11).   Medical History: Past Medical History  Diagnosis Date  . DJD (degenerative joint disease)   . Somatic dysfunction   . HTN (hypertension)   . ED (erectile dysfunction)   . Tubular adenoma of colon 2002  . Stroke     affected vision and speech mildly  . Polio     right leg/arm weakness    Medications:   (Not in a hospital admission) Home meds reviewed.  Assessment: Okay for Protocol, Bilateral pulmonary emboli with a small amount of saddle embolus at the bifurcation per CT, baseline PT/INR pending.  Goal of Therapy:  INR 2-3 Monitor platelets by anticoagulation protocol: Yes   Plan:  Give 4000 units bolus x 1 Start heparin infusion at 1500 units/hr Check anti-Xa level in 6-8 hours and daily while on heparin Continue to monitor H&H and platelets  Pricilla Larsson 06/25/2014,10:59 PM

## 2014-06-26 ENCOUNTER — Inpatient Hospital Stay (HOSPITAL_COMMUNITY): Payer: Medicare Other

## 2014-06-26 DIAGNOSIS — I2692 Saddle embolus of pulmonary artery without acute cor pulmonale: Principal | ICD-10-CM

## 2014-06-26 LAB — HEPARIN LEVEL (UNFRACTIONATED)
Heparin Unfractionated: 0.96 IU/mL — ABNORMAL HIGH (ref 0.30–0.70)
Heparin Unfractionated: 1 IU/mL — ABNORMAL HIGH (ref 0.30–0.70)

## 2014-06-26 LAB — CBC
HCT: 41.3 % (ref 39.0–52.0)
HCT: 38.7 % — ABNORMAL LOW (ref 39.0–52.0)
HCT: 38 % — ABNORMAL LOW (ref 39.0–52.0)
HCT: 37 % — ABNORMAL LOW (ref 39.0–52.0)
Hemoglobin: 13.8 g/dL (ref 13.0–17.0)
Hemoglobin: 13 g/dL (ref 13.0–17.0)
Hemoglobin: 12.6 g/dL — ABNORMAL LOW (ref 13.0–17.0)
Hemoglobin: 12.3 g/dL — ABNORMAL LOW (ref 13.0–17.0)
MCH: 28 pg (ref 26.0–34.0)
MCH: 27.9 pg (ref 26.0–34.0)
MCH: 27.6 pg (ref 26.0–34.0)
MCH: 27.6 pg (ref 26.0–34.0)
MCHC: 33.4 g/dL (ref 30.0–36.0)
MCHC: 33.6 g/dL (ref 30.0–36.0)
MCHC: 33.2 g/dL (ref 30.0–36.0)
MCHC: 33.2 g/dL (ref 30.0–36.0)
MCV: 83.9 fL (ref 78.0–100.0)
MCV: 83 fL (ref 78.0–100.0)
MCV: 83.3 fL (ref 78.0–100.0)
MCV: 83.1 fL (ref 78.0–100.0)
Platelets: 223 10*3/uL (ref 150–400)
Platelets: 216 10*3/uL (ref 150–400)
Platelets: 209 10*3/uL (ref 150–400)
Platelets: 210 10*3/uL (ref 150–400)
RBC: 4.92 MIL/uL (ref 4.22–5.81)
RBC: 4.66 MIL/uL (ref 4.22–5.81)
RBC: 4.56 MIL/uL (ref 4.22–5.81)
RBC: 4.45 MIL/uL (ref 4.22–5.81)
RDW: 14.3 % (ref 11.5–15.5)
RDW: 14.5 % (ref 11.5–15.5)
RDW: 14.2 % (ref 11.5–15.5)
RDW: 14.3 % (ref 11.5–15.5)
WBC: 6.3 10*3/uL (ref 4.0–10.5)
WBC: 5.2 10*3/uL (ref 4.0–10.5)
WBC: 5.3 10*3/uL (ref 4.0–10.5)
WBC: 6.1 10*3/uL (ref 4.0–10.5)

## 2014-06-26 LAB — BASIC METABOLIC PANEL
Anion gap: 14 (ref 5–15)
BUN: 14 mg/dL (ref 6–23)
CO2: 25 mEq/L (ref 19–32)
Calcium: 9.1 mg/dL (ref 8.4–10.5)
Chloride: 100 mEq/L (ref 96–112)
Creatinine, Ser: 1.08 mg/dL (ref 0.50–1.35)
GFR calc Af Amer: 81 mL/min — ABNORMAL LOW (ref >90)
GFR calc non Af Amer: 70 mL/min — ABNORMAL LOW (ref >90)
Glucose, Bld: 109 mg/dL — ABNORMAL HIGH (ref 70–99)
Potassium: 4.1 mEq/L (ref 3.7–5.3)
Sodium: 139 mEq/L (ref 137–147)

## 2014-06-26 LAB — FIBRINOGEN
Fibrinogen: 435 mg/dL (ref 204–475)
Fibrinogen: 379 mg/dL (ref 204–475)
Fibrinogen: 394 mg/dL (ref 204–475)

## 2014-06-26 LAB — MRSA PCR SCREENING: MRSA by PCR: NEGATIVE

## 2014-06-26 LAB — MAGNESIUM: Magnesium: 2.1 mg/dL (ref 1.5–2.5)

## 2014-06-26 LAB — GLUCOSE, CAPILLARY
Glucose-Capillary: 102 mg/dL — ABNORMAL HIGH (ref 70–99)
Glucose-Capillary: 84 mg/dL (ref 70–99)
Glucose-Capillary: 86 mg/dL (ref 70–99)
Glucose-Capillary: 110 mg/dL — ABNORMAL HIGH (ref 70–99)
Glucose-Capillary: 79 mg/dL (ref 70–99)

## 2014-06-26 LAB — TROPONIN I: Troponin I: 0.8 ng/mL (ref <0.30)

## 2014-06-26 LAB — HOMOCYSTEINE: Homocysteine: 16.4 umol/L — ABNORMAL HIGH (ref 4.0–15.4)

## 2014-06-26 LAB — PHOSPHORUS: Phosphorus: 3.5 mg/dL (ref 2.3–4.6)

## 2014-06-26 MED ORDER — SODIUM CHLORIDE 0.9 % IJ SOLN
3.0000 mL | Freq: Two times a day (BID) | INTRAMUSCULAR | Status: DC
  Administered 2014-06-26 – 2014-06-28 (×4): 3 mL via INTRAVENOUS

## 2014-06-26 MED ORDER — SODIUM CHLORIDE 0.9 % IV SOLN: INTRAVENOUS | Status: DC

## 2014-06-26 MED ORDER — IOHEXOL 300 MG/ML  SOLN
50.0000 mL | Freq: Once | INTRAMUSCULAR | Status: AC | PRN
  Administered 2014-06-26: 1 mL via INTRAVENOUS

## 2014-06-26 MED ORDER — SODIUM CHLORIDE 0.9 % IV SOLN: 250.0000 mL | INTRAVENOUS | Status: DC | PRN

## 2014-06-26 MED ORDER — SODIUM CHLORIDE 0.9 % IV SOLN
12.0000 mg | Freq: Once | INTRAVENOUS | Status: AC
  Administered 2014-06-26: 12 mg via INTRAVENOUS
  Filled 2014-06-26: qty 12

## 2014-06-26 MED ORDER — SODIUM CHLORIDE 0.9 % IV SOLN
INTRAVENOUS | Status: DC
  Administered 2014-06-26: 06:00:00 via INTRAVENOUS

## 2014-06-26 MED ORDER — HYDRALAZINE HCL 20 MG/ML IJ SOLN: 10.0000 mg | INTRAMUSCULAR | Status: DC | PRN

## 2014-06-26 MED ORDER — SODIUM CHLORIDE 0.9 % IJ SOLN
3.0000 mL | Freq: Two times a day (BID) | INTRAMUSCULAR | Status: DC
  Administered 2014-06-26: 3 mL via INTRAVENOUS

## 2014-06-26 MED ORDER — HEPARIN (PORCINE) IN NACL 100-0.45 UNIT/ML-% IJ SOLN: 1350.0000 [IU]/h | INTRAMUSCULAR | Status: DC

## 2014-06-26 MED ORDER — METOPROLOL TARTRATE 1 MG/ML IV SOLN
2.5000 mg | INTRAVENOUS | Status: DC | PRN
  Administered 2014-06-26 (×2): 2.5 mg via INTRAVENOUS
  Filled 2014-06-26: qty 5

## 2014-06-26 MED ORDER — FENTANYL CITRATE 0.05 MG/ML IJ SOLN
INTRAMUSCULAR | Status: AC
  Filled 2014-06-26: qty 4

## 2014-06-26 MED ORDER — ALTEPLASE 50 MG IV SOLR
12.0000 mg | Freq: Once | INTRAVENOUS | Status: DC
  Filled 2014-06-26: qty 12

## 2014-06-26 MED ORDER — SODIUM CHLORIDE 0.9 % IJ SOLN: 3.0000 mL | INTRAMUSCULAR | Status: DC | PRN

## 2014-06-26 MED ORDER — SODIUM CHLORIDE 0.9 % IV SOLN
INTRAVENOUS | Status: DC
  Administered 2014-06-26 – 2014-06-27 (×2): via INTRAVENOUS

## 2014-06-26 MED ORDER — MIDAZOLAM HCL 2 MG/2ML IJ SOLN
INTRAMUSCULAR | Status: AC
  Filled 2014-06-26: qty 4

## 2014-06-26 MED ORDER — MIDAZOLAM HCL 2 MG/2ML IJ SOLN
INTRAMUSCULAR | Status: AC | PRN
  Administered 2014-06-26: 0.5 mg via INTRAVENOUS

## 2014-06-26 MED ORDER — SODIUM CHLORIDE 0.9 % IV SOLN
12.0000 mg | Freq: Once | INTRAVENOUS | Status: DC
  Filled 2014-06-26: qty 12

## 2014-06-26 MED ORDER — LIDOCAINE HCL 1 % IJ SOLN
INTRAMUSCULAR | Status: AC
  Filled 2014-06-26: qty 20

## 2014-06-26 MED ORDER — HEPARIN (PORCINE) IN NACL 100-0.45 UNIT/ML-% IJ SOLN
1350.0000 [IU]/h | INTRAMUSCULAR | Status: DC
  Administered 2014-06-27: 1350 [IU]/h via INTRAVENOUS
  Filled 2014-06-26: qty 250

## 2014-06-26 MED ORDER — FENTANYL CITRATE 0.05 MG/ML IJ SOLN
INTRAMUSCULAR | Status: AC | PRN
  Administered 2014-06-26: 25 ug via INTRAVENOUS

## 2014-06-26 NOTE — Sedation Documentation (Signed)
Pt noted to have multiple multifocal PVCs ( approx 20) w/in a minute during catheter advancement.  Resolved. Pt tolerated well. MD aware.

## 2014-06-26 NOTE — Sedation Documentation (Signed)
All lines connected, EKOS machine activated.

## 2014-06-26 NOTE — Progress Notes (Signed)
Transferred back to unit, RN and tech at side.

## 2014-06-26 NOTE — Sedation Documentation (Signed)
Dr. Annamaria Boots at bedside s/w wife and pt regarding procedure risks and benefits, Consent signed.

## 2014-06-26 NOTE — Progress Notes (Signed)
LPP - 27/13 - mean 18

## 2014-06-26 NOTE — Progress Notes (Signed)
Patient was not on unit at this time. Patient in IR.

## 2014-06-26 NOTE — Progress Notes (Signed)
CRITICAL VALUE ALERT  Critical value received:  Troponin 0.80  Date of notification:  06/26/2014  Time of notification:  8502  Critical value read back:Yes.    Nurse who received alert:  Candelaria Celeste, RN  MD notified (1st page):  [place]  Time of first page:  0335  Responding MD:  Lake Bells  Time MD responded:  [phone]

## 2014-06-26 NOTE — Progress Notes (Signed)
Bleeding noted on right femoral site. Dressing seems to saturated fairly fast. PA  From IR at bedside. Order for IR tech to redress the site. No new recommendations, i will continue with current plan of care until otherwise. Will continue monitor and  Remain in close consultations with IR.

## 2014-06-26 NOTE — Progress Notes (Signed)
Rt groin sheath x 2 pulled and manual pressure held by Dr. Laurence Ferrari.  MD speaking with pt regarding clots and answering questions.

## 2014-06-26 NOTE — Sedation Documentation (Signed)
Pt transferred to bed without difficulty. Tolerated well. VSS.

## 2014-06-26 NOTE — Progress Notes (Signed)
Rt Main pulm artery pressure - 42/18 mean 27. Main pulmunary artery - 43/12 mean 24 Lt Main pulm artery pressure (PAP) - 34/14 mean 23 Per reading Dr. Laurence Ferrari

## 2014-06-26 NOTE — Progress Notes (Signed)
Pt transferred to IR 1 w/ 2 rad techs and RN at side.  Tolerated well. Ekos turned off per Cherl Dette, Rad tech.  Family followed to waiting room

## 2014-06-26 NOTE — Progress Notes (Signed)
Patient ID: George Matthews, male   DOB: 1951/05/12, 63 y.o.   MRN: [id number]    Reason for Consult: Submassive bilateral PE  Referring Physician(s): PCCM  History of Present Illness: George Matthews is a 63 y.o. male who presented to [facility] on 9/10 c/o shortness of breath getting worse over the past week and oxygen saturation was 80% on RA. CTA revealed submassive bilateral PE with elevated RV/LV ratio, he was requiring a NRB on admission and denies ever being on oxygen at home. He underwent successful insertion of bilateral EKOS infusion caths for PE lysis on 9/11 at 0500 am. He denies any right groin site pain or chest pain. He does still c/o SOB and is now on vent mask 15 L. He also has bleeding at access site.  Allergies: Review of patient's allergies indicates no known allergies.  Medications: Prior to Admission medications   Medication Sig Start Date End Date Taking? Authorizing Provider  acetaminophen (TYLENOL) 500 MG tablet Take 500 mg by mouth every 6 (six) hours as needed for mild pain or moderate pain.   Yes Historical Provider, MD  hydrochlorothiazide (MICROZIDE) 12.5 MG capsule Take 12.5 mg by mouth every morning.    Yes Historical Provider, MD  ibuprofen (ADVIL,MOTRIN) 200 MG tablet Take 200 mg by mouth every 6 (six) hours as needed for mild pain or moderate pain.   Yes Historical Provider, MD   Review of Systems  Respiratory: Negative for chest tightness.   Cardiovascular: Negative for chest pain.  Genitourinary: Negative for hematuria.  Musculoskeletal:       Denies right groin pain.    Vital Signs: BP 146/68  Pulse 70  Temp(Src) 98 F (36.7 C) (Oral)  Resp 12  Ht 6' 3"  (1.905 m)  Wt 239 lb 10.2 oz (108.7 kg)  BMI 29.95 kg/m2  SpO2 94%  Physical Exam General: A&Ox3, on vent mask 15 L Heart: RRR without m/g/r Lungs: CTA bilaterally. Abd: Soft, NT, ND Ext: RCFA sheaths intact, dressing with bright red blood saturated, site soft, NT, DP intact.  Imaging: Dg  Chest 2 View  06/25/2014   CLINICAL DATA:  Shortness of breath  EXAM: CHEST  2 VIEW  COMPARISON:  None.  FINDINGS: The heart size and mediastinal contours are within normal limits. Both lungs are clear. The visualized skeletal structures are unremarkable.  IMPRESSION: No active cardiopulmonary disease.   Electronically Signed   By: Inez Catalina M.D.   On: 06/25/2014 21:18   Ct Angio Chest Pe W/cm &/or Wo Cm  06/25/2014   CLINICAL DATA:  Progressive shortness of breath  EXAM: CT ANGIOGRAPHY CHEST WITH CONTRAST  TECHNIQUE: Multidetector CT imaging of the chest was performed using the standard protocol during bolus administration of intravenous contrast. Multiplanar CT image reconstructions and MIPs were obtained to evaluate the vascular anatomy.  CONTRAST:  15m OMNIPAQUE IOHEXOL 350 MG/ML SOLN  COMPARISON:  Chest radiograph dated 06/25/2014  FINDINGS: Bilateral pulmonary emboli with a small amount of saddle embolus at the bifurcation (series 8/image 143), extensive clot in the left main pulmonary artery (series 8/ image 125), and additional lobe are thrombus of the bifurcation of the right upper, middle, and lower pulmonary arteries (series 8/image 169). Overall clot burden is moderate to large.  Elevated RV to LV ratio (1.28), raising the possibility of right heart strain.  1.5 cm patchy subpleural nodule at the left lung base (series 5/ image 73). No pleural effusion or pneumothorax.  Visualized thyroid is unremarkable.  Heart  is normal in size. No pericardial effusion. Coronary atherosclerosis. Atherosclerotic calcifications of the aortic arch.  Visualized upper abdomen is unremarkable.  Degenerative changes of the visualized thoracolumbar spine.  Review of the MIP images confirms the above findings.  IMPRESSION: Bilateral pulmonary emboli, as above. Overall clot burden is moderate to large. Elevated RV to LV ratio, raising the possibility of right heart strain.  Positive for acute PE with CT evidence of  right heart strain (RV/LV Ratio = 1.28) consistent with at least submassive (intermediate risk) PE. The presence of right heart strain has been associated with an increased risk of morbidity and mortality.  1.5 cm subpleural nodule at the left lung base. Initial follow-up CT chest is suggested in 3 months.  This recommendation follows the consensus statement: Guidelines for Management of Small Pulmonary Nodules Detected on CT Scans: A Statement from the [employer] as published in Radiology 2005; 237:395-400.  Critical value/emergent results were called by telephone at the time of interpretation on 06/25/2014 at 10:15 pm to Dr. Tanna Furry , who verbally acknowledged these results.   Electronically Signed   By: Julian Hy M.D.   On: 06/25/2014 22:18   Ir Angiogram Pulmonary Bilateral Selective  06/26/2014   CLINICAL DATA:  Acute sub massive pulmonary embolus, hypoxia, tachypnea, evidence of right ventricular heart strain  EXAM: ULTRASOUND GUIDANCE FOR VASCULAR ACCESS  BILATERAL PULMONARY ARTERY CATHETERIZATIONS AND ANGIOGRAMS WITH PRESSURE MEASUREMENT  INSERTION OF EKOS ULTRASOUND ASSISTED INFUSION CATHETERS FOR PULMONARY EMBOLUS THROMBOLYSIS  Date:  9/11/20159/08/2014 5:33 am  Radiologist:  M. Daryll Brod, MD  Guidance:  Ultrasound and fluoroscopic  FLUOROSCOPY TIME:  9 min 54 seconds  MEDICATIONS AND MEDICAL HISTORY: 0.5 mg Versed, 25 mcg fentanyl  ANESTHESIA/SEDATION: 30 min  CONTRAST:  33m OMNIPAQUE IOHEXOL 300 MG/ML  SOLN  COMPLICATIONS: No immediate  PROCEDURE: Informed consent was obtained from the patient following explanation of the procedure, risks, benefits and alternatives. The patient understands, agrees and consents for the procedure. All questions were addressed. A time out was performed.  Maximal barrier sterile technique utilized including caps, mask, sterile gowns, sterile gloves, large sterile drape, hand hygiene, and Betadine.  Under sterile conditions and local anesthesia,  ultrasound micropuncture right common femoral venous access was performed. Over a guidewire a 6 French sheath was inserted. In a similar fashion, a second right common femoral vein 6 French sheath was inserted in a tandem fashion.  Through the inferior sheath, a 100 cm vert catheter was advanced over a Bentson guidewire and utilized to select the left lower lobe pulmonary artery. Selective pulmonary angiogram performed. Initial pressure measurement obtained. Right pulmonary artery pressure: 27/ 13 (18).  Limited pulmonary angiogram demonstrates central nonocclusive filling defect compatible with pulmonary embolus and correlates with the CTA.  Through the superior sheath, a second 100 cm vert catheter was advanced over a Bentson guidewire and utilized to select the right pulmonary artery advancing into the right lower lobe. Limited pulmonary angiogram confirms position.  Rosen guidewires advanced through both of the vertebral catheters. Over the [facility], EKOS ultrasound assisted infusion catheters were advanced into the pulmonary arteries with a 12 cm infusion length on the left and an 18 cm infusion length on the right. Internal ultrasound wires were advanced through the infusion catheters. Positions confirmed with fluoroscopy. Images obtained for documentation.  Patient tolerated the procedure well.  TPA infusion will be initiated at 1 milligram/hour through each catheter over 12 hr (total dose 24 mg).  IMPRESSION: Successful insertion of bilateral pulmonary  artery EKOS ultrasound assisted infusion catheters to begin pulmonary embolus thrombolysis.   Electronically Signed   By: Daryll Brod M.D.   On: 06/26/2014 08:45   Ir Angiogram Selective Each Additional Vessel  06/26/2014   CLINICAL DATA:  Acute sub massive pulmonary embolus, hypoxia, tachypnea, evidence of right ventricular heart strain  EXAM: ULTRASOUND GUIDANCE FOR VASCULAR ACCESS  BILATERAL PULMONARY ARTERY CATHETERIZATIONS AND ANGIOGRAMS WITH  PRESSURE MEASUREMENT  INSERTION OF EKOS ULTRASOUND ASSISTED INFUSION CATHETERS FOR PULMONARY EMBOLUS THROMBOLYSIS  Date:  9/11/20159/08/2014 5:33 am  Radiologist:  M. Daryll Brod, MD  Guidance:  Ultrasound and fluoroscopic  FLUOROSCOPY TIME:  9 min 54 seconds  MEDICATIONS AND MEDICAL HISTORY: 0.5 mg Versed, 25 mcg fentanyl  ANESTHESIA/SEDATION: 30 min  CONTRAST:  18m OMNIPAQUE IOHEXOL 300 MG/ML  SOLN  COMPLICATIONS: No immediate  PROCEDURE: Informed consent was obtained from the patient following explanation of the procedure, risks, benefits and alternatives. The patient understands, agrees and consents for the procedure. All questions were addressed. A time out was performed.  Maximal barrier sterile technique utilized including caps, mask, sterile gowns, sterile gloves, large sterile drape, hand hygiene, and Betadine.  Under sterile conditions and local anesthesia, ultrasound micropuncture right common femoral venous access was performed. Over a guidewire a 6 French sheath was inserted. In a similar fashion, a second right common femoral vein 6 French sheath was inserted in a tandem fashion.  Through the inferior sheath, a 100 cm vert catheter was advanced over a Bentson guidewire and utilized to select the left lower lobe pulmonary artery. Selective pulmonary angiogram performed. Initial pressure measurement obtained. Right pulmonary artery pressure: 27/ 13 (18).  Limited pulmonary angiogram demonstrates central nonocclusive filling defect compatible with pulmonary embolus and correlates with the CTA.  Through the superior sheath, a second 100 cm vert catheter was advanced over a Bentson guidewire and utilized to select the right pulmonary artery advancing into the right lower lobe. Limited pulmonary angiogram confirms position.  Rosen guidewires advanced through both of the vertebral catheters. Over the [facility], EKOS ultrasound assisted infusion catheters were advanced into the pulmonary arteries with  a 12 cm infusion length on the left and an 18 cm infusion length on the right. Internal ultrasound wires were advanced through the infusion catheters. Positions confirmed with fluoroscopy. Images obtained for documentation.  Patient tolerated the procedure well.  TPA infusion will be initiated at 1 milligram/hour through each catheter over 12 hr (total dose 24 mg).  IMPRESSION: Successful insertion of bilateral pulmonary artery EKOS ultrasound assisted infusion catheters to begin pulmonary embolus thrombolysis.   Electronically Signed   By: TDaryll BrodM.D.   On: 06/26/2014 08:45   Ir [place] Vasc Access Right  06/26/2014   CLINICAL DATA:  Acute sub massive pulmonary embolus, hypoxia, tachypnea, evidence of right ventricular heart strain  EXAM: ULTRASOUND GUIDANCE FOR VASCULAR ACCESS  BILATERAL PULMONARY ARTERY CATHETERIZATIONS AND ANGIOGRAMS WITH PRESSURE MEASUREMENT  INSERTION OF EKOS ULTRASOUND ASSISTED INFUSION CATHETERS FOR PULMONARY EMBOLUS THROMBOLYSIS  Date:  9/11/20159/08/2014 5:33 am  Radiologist:  M. TDaryll Brod MD  Guidance:  Ultrasound and fluoroscopic  FLUOROSCOPY TIME:  9 min 54 seconds  MEDICATIONS AND MEDICAL HISTORY: 0.5 mg Versed, 25 mcg fentanyl  ANESTHESIA/SEDATION: 30 min  CONTRAST:  363mOMNIPAQUE IOHEXOL 300 MG/ML  SOLN  COMPLICATIONS: No immediate  PROCEDURE: Informed consent was obtained from the patient following explanation of the procedure, risks, benefits and alternatives. The patient understands, agrees and consents for the procedure. All questions were addressed.  A time out was performed.  Maximal barrier sterile technique utilized including caps, mask, sterile gowns, sterile gloves, large sterile drape, hand hygiene, and Betadine.  Under sterile conditions and local anesthesia, ultrasound micropuncture right common femoral venous access was performed. Over a guidewire a 6 French sheath was inserted. In a similar fashion, a second right common femoral vein 6 French sheath was  inserted in a tandem fashion.  Through the inferior sheath, a 100 cm vert catheter was advanced over a Bentson guidewire and utilized to select the left lower lobe pulmonary artery. Selective pulmonary angiogram performed. Initial pressure measurement obtained. Right pulmonary artery pressure: 27/ 13 (18).  Limited pulmonary angiogram demonstrates central nonocclusive filling defect compatible with pulmonary embolus and correlates with the CTA.  Through the superior sheath, a second 100 cm vert catheter was advanced over a Bentson guidewire and utilized to select the right pulmonary artery advancing into the right lower lobe. Limited pulmonary angiogram confirms position.  Rosen guidewires advanced through both of the vertebral catheters. Over the [facility] guidewires, EKOS ultrasound assisted infusion catheters were advanced into the pulmonary arteries with a 12 cm infusion length on the left and an 18 cm infusion length on the right. Internal ultrasound wires were advanced through the infusion catheters. Positions confirmed with fluoroscopy. Images obtained for documentation.  Patient tolerated the procedure well.  TPA infusion will be initiated at 1 milligram/hour through each catheter over 12 hr (total dose 24 mg).  IMPRESSION: Successful insertion of bilateral pulmonary artery EKOS ultrasound assisted infusion catheters to begin pulmonary embolus thrombolysis.   Electronically Signed   By: Daryll Brod M.D.   On: 06/26/2014 08:45   Ir Infusion Thrombol Arterial Initial (ms)  06/26/2014   CLINICAL DATA:  Acute sub massive pulmonary embolus, hypoxia, tachypnea, evidence of right ventricular heart strain  EXAM: ULTRASOUND GUIDANCE FOR VASCULAR ACCESS  BILATERAL PULMONARY ARTERY CATHETERIZATIONS AND ANGIOGRAMS WITH PRESSURE MEASUREMENT  INSERTION OF EKOS ULTRASOUND ASSISTED INFUSION CATHETERS FOR PULMONARY EMBOLUS THROMBOLYSIS  Date:  9/11/20159/08/2014 5:33 am  Radiologist:  M. Daryll Brod, MD  Guidance:   Ultrasound and fluoroscopic  FLUOROSCOPY TIME:  9 min 54 seconds  MEDICATIONS AND MEDICAL HISTORY: 0.5 mg Versed, 25 mcg fentanyl  ANESTHESIA/SEDATION: 30 min  CONTRAST:  69m OMNIPAQUE IOHEXOL 300 MG/ML  SOLN  COMPLICATIONS: No immediate  PROCEDURE: Informed consent was obtained from the patient following explanation of the procedure, risks, benefits and alternatives. The patient understands, agrees and consents for the procedure. All questions were addressed. A time out was performed.  Maximal barrier sterile technique utilized including caps, mask, sterile gowns, sterile gloves, large sterile drape, hand hygiene, and Betadine.  Under sterile conditions and local anesthesia, ultrasound micropuncture right common femoral venous access was performed. Over a guidewire a 6 French sheath was inserted. In a similar fashion, a second right common femoral vein 6 French sheath was inserted in a tandem fashion.  Through the inferior sheath, a 100 cm vert catheter was advanced over a Bentson guidewire and utilized to select the left lower lobe pulmonary artery. Selective pulmonary angiogram performed. Initial pressure measurement obtained. Right pulmonary artery pressure: 27/ 13 (18).  Limited pulmonary angiogram demonstrates central nonocclusive filling defect compatible with pulmonary embolus and correlates with the CTA.  Through the superior sheath, a second 100 cm vert catheter was advanced over a Bentson guidewire and utilized to select the right pulmonary artery advancing into the right lower lobe. Limited pulmonary angiogram confirms position.  Rosen guidewires advanced through both of  the vertebral catheters. Over the [facility] guidewires, EKOS ultrasound assisted infusion catheters were advanced into the pulmonary arteries with a 12 cm infusion length on the left and an 18 cm infusion length on the right. Internal ultrasound wires were advanced through the infusion catheters. Positions confirmed with fluoroscopy. Images  obtained for documentation.  Patient tolerated the procedure well.  TPA infusion will be initiated at 1 milligram/hour through each catheter over 12 hr (total dose 24 mg).  IMPRESSION: Successful insertion of bilateral pulmonary artery EKOS ultrasound assisted infusion catheters to begin pulmonary embolus thrombolysis.   Electronically Signed   By: Daryll Brod M.D.   On: 06/26/2014 08:45    Labs: Results for orders placed during the hospital encounter of 06/25/14 (from the past 48 hour(s))  BASIC METABOLIC PANEL     Status: Abnormal   Collection Time    06/25/14  7:37 PM      Result Value Ref Range   Sodium 140  137 - 147 mEq/L   Potassium 3.8  3.7 - 5.3 mEq/L   Chloride 101  96 - 112 mEq/L   CO2 26  19 - 32 mEq/L   Glucose, Bld 93  70 - 99 mg/dL   BUN 16  6 - 23 mg/dL   Creatinine, Ser 1.11  0.50 - 1.35 mg/dL   Calcium 9.2  8.4 - 10.5 mg/dL   GFR calc non Af Amer 68 (*) >90 mL/min   GFR calc Af Amer 79 (*) >90 mL/min   Comment: (NOTE)     The eGFR has been calculated using the CKD EPI equation.     This calculation has not been validated in all clinical situations.     eGFR's persistently <90 mL/min signify possible Chronic Kidney     Disease.   Anion gap 13  5 - 15  CBC WITH DIFFERENTIAL     Status: None   Collection Time    06/25/14  7:37 PM      Result Value Ref Range   WBC 5.3  4.0 - 10.5 K/uL   RBC 5.14  4.22 - 5.81 MIL/uL   Hemoglobin 14.7  13.0 - 17.0 g/dL   HCT 43.4  39.0 - 52.0 %   MCV 84.4  78.0 - 100.0 fL   MCH 28.6  26.0 - 34.0 pg   MCHC 33.9  30.0 - 36.0 g/dL   RDW 14.4  11.5 - 15.5 %   Platelets 228  150 - 400 K/uL   Neutrophils Relative % 51  43 - 77 %   Neutro Abs 2.7  1.7 - 7.7 K/uL   Lymphocytes Relative 36  12 - 46 %   Lymphs Abs 1.9  0.7 - 4.0 K/uL   Monocytes Relative 7  3 - 12 %   Monocytes Absolute 0.4  0.1 - 1.0 K/uL   Eosinophils Relative 5  0 - 5 %   Eosinophils Absolute 0.3  0.0 - 0.7 K/uL   Basophils Relative 1  0 - 1 %   Basophils  Absolute 0.0  0.0 - 0.1 K/uL  TROPONIN I     Status: None   Collection Time    06/25/14  7:37 PM      Result Value Ref Range   Troponin I <0.30  <0.30 ng/mL   Comment:            Due to the release kinetics of cTnI,     a negative result within the first hours  of the onset of symptoms does not rule out     myocardial infarction with certainty.     If myocardial infarction is still suspected,     repeat the test at appropriate intervals.  PRO B NATRIURETIC PEPTIDE     Status: None   Collection Time    06/25/14  7:37 PM      Result Value Ref Range   Pro B Natriuretic peptide (BNP) 80.1  0 - 125 pg/mL  BLOOD GAS, ARTERIAL     Status: Abnormal   Collection Time    06/25/14  9:57 PM      Result Value Ref Range   FIO2 1.00     Delivery systems NON-REBREATHER OXYGEN MASK     pH, Arterial 7.423  7.350 - 7.450   pCO2 arterial 36.4  35.0 - 45.0 mmHg   pO2, Arterial 60.6 (*) 80.0 - 100.0 mmHg   Bicarbonate 23.3  20.0 - 24.0 mEq/L   TCO2 20.6  0 - 100 mmol/L   Acid-base deficit 0.5  0.0 - 2.0 mmol/L   O2 Saturation 90.5     Patient temperature 37.0     Collection site RIGHT RADIAL     Drawn by [phone]     Sample type ARTERIAL DRAW     Allens test (pass/fail) PASS  PASS  PROTIME-INR     Status: None   Collection Time    06/25/14 10:51 PM      Result Value Ref Range   Prothrombin Time 12.9  11.6 - 15.2 seconds   INR 0.97  0.00 - 1.49  MRSA PCR SCREENING     Status: None   Collection Time    06/26/14  1:38 AM      Result Value Ref Range   MRSA by PCR NEGATIVE  NEGATIVE   Comment:            The GeneXpert MRSA Assay (FDA     approved for NASAL specimens     only), is one component of a     comprehensive MRSA colonization     surveillance program. It is not     intended to diagnose MRSA     infection nor to guide or     monitor treatment for     MRSA infections.  CBC     Status: None   Collection Time    06/26/14  2:29 AM      Result Value Ref Range   WBC 6.3  4.0 - 10.5  K/uL   RBC 4.92  4.22 - 5.81 MIL/uL   Hemoglobin 13.8  13.0 - 17.0 g/dL   HCT 41.3  39.0 - 52.0 %   MCV 83.9  78.0 - 100.0 fL   MCH 28.0  26.0 - 34.0 pg   MCHC 33.4  30.0 - 36.0 g/dL   RDW 14.3  11.5 - 15.5 %   Platelets 223  150 - 400 K/uL  BASIC METABOLIC PANEL     Status: Abnormal   Collection Time    06/26/14  2:29 AM      Result Value Ref Range   Sodium 139  137 - 147 mEq/L   Potassium 4.1  3.7 - 5.3 mEq/L   Chloride 100  96 - 112 mEq/L   CO2 25  19 - 32 mEq/L   Glucose, Bld 109 (*) 70 - 99 mg/dL   BUN 14  6 - 23 mg/dL   Creatinine, Ser 1.08  0.50 - 1.35 mg/dL  Calcium 9.1  8.4 - 10.5 mg/dL   GFR calc non Af Amer 70 (*) >90 mL/min   GFR calc Af Amer 81 (*) >90 mL/min   Comment: (NOTE)     The eGFR has been calculated using the CKD EPI equation.     This calculation has not been validated in all clinical situations.     eGFR's persistently <90 mL/min signify possible Chronic Kidney     Disease.   Anion gap 14  5 - 15  MAGNESIUM     Status: None   Collection Time    06/26/14  2:29 AM      Result Value Ref Range   Magnesium 2.1  1.5 - 2.5 mg/dL  PHOSPHORUS     Status: None   Collection Time    06/26/14  2:29 AM      Result Value Ref Range   Phosphorus 3.5  2.3 - 4.6 mg/dL  TROPONIN I     Status: Abnormal   Collection Time    06/26/14  2:29 AM      Result Value Ref Range   Troponin I 0.80 (*) <0.30 ng/mL   Comment:            Due to the release kinetics of cTnI,     a negative result within the first hours     of the onset of symptoms does not rule out     myocardial infarction with certainty.     If myocardial infarction is still suspected,     repeat the test at appropriate intervals.     CRITICAL RESULT CALLED TO, READ BACK BY AND VERIFIED WITH:     HARDUK,G RN 06/26/2014 0333 JORDANS  CBC     Status: Abnormal   Collection Time    06/26/14  4:08 AM      Result Value Ref Range   WBC 5.2  4.0 - 10.5 K/uL   RBC 4.66  4.22 - 5.81 MIL/uL   Hemoglobin 13.0   13.0 - 17.0 g/dL   HCT 38.7 (*) 39.0 - 52.0 %   MCV 83.0  78.0 - 100.0 fL   MCH 27.9  26.0 - 34.0 pg   MCHC 33.6  30.0 - 36.0 g/dL   RDW 14.5  11.5 - 15.5 %   Platelets 216  150 - 400 K/uL  GLUCOSE, CAPILLARY     Status: Abnormal   Collection Time    06/26/14  7:53 AM      Result Value Ref Range   Glucose-Capillary 102 (*) 70 - 99 mg/dL  CBC     Status: Abnormal   Collection Time    06/26/14 11:08 AM      Result Value Ref Range   WBC 5.3  4.0 - 10.5 K/uL   RBC 4.56  4.22 - 5.81 MIL/uL   Hemoglobin 12.6 (*) 13.0 - 17.0 g/dL   HCT 38.0 (*) 39.0 - 52.0 %   MCV 83.3  78.0 - 100.0 fL   MCH 27.6  26.0 - 34.0 pg   MCHC 33.2  30.0 - 36.0 g/dL   RDW 14.2  11.5 - 15.5 %   Platelets 209  150 - 400 K/uL  FIBRINOGEN     Status: None   Collection Time    06/26/14 11:08 AM      Result Value Ref Range   Fibrinogen 435  204 - 475 mg/dL  GLUCOSE, CAPILLARY     Status: None   Collection Time  06/26/14 12:47 PM      Result Value Ref Range   Glucose-Capillary 84  70 - 99 mg/dL  GLUCOSE, CAPILLARY     Status: None   Collection Time    06/26/14 12:50 PM      Result Value Ref Range   Glucose-Capillary 86  70 - 99 mg/dL    Assessment and Plan: Acute submassive bilateral pulmonary embolism with evidence of elevated RV/LV ratio on CTA possible right heart strain on vent mask 15 L S/p Successful insertion of bilateral EKOS infusion caths for PE lysis 9/11 0500 for 12 hour infusion, fibrinogen 435 at 1100. Repeat catheter pressures 9/12 am Monitor labs and access site for bleeding, right access site will need dressing change today by IR with sandbag placed.  Tsosie Billing PA-C Interventional Radiology  06/26/14  1:17 PM   I spent a total of 15 minutes face to face in clinical consultation/evaluation, greater than 50% of which was counseling/coordinating care

## 2014-06-26 NOTE — Procedures (Signed)
Interventional Radiology Procedure Note  Procedure: Completion of bilateral PE lysis.   Complications: None Recommendations: - Bedrest with leg straight x 2 hrs - ADAT after 2 hrs - Resume heparin in 2 hrs  Signed,  Criselda Peaches, MD

## 2014-06-26 NOTE — Progress Notes (Signed)
Dr. Laurence Ferrari in procedure room

## 2014-06-26 NOTE — Progress Notes (Addendum)
ANTICOAGULATION CONSULT NOTE - Follow Up Consult  Pharmacy Consult:  Heparin Indication:  Bilateral PEs  No Known Allergies  Patient Measurements: Height: 6\' 3"  (190.5 cm) Weight: 239 lb 10.2 oz (108.7 kg) IBW/kg (Calculated) : 84.5 Heparin Dosing Weight: 107 kg  Vital Signs: Temp: 98.9 F (37.2 C) (09/11 1610) Temp src: Oral (09/11 1610) BP: 156/66 mmHg (09/11 1500) Pulse Rate: 73 (09/11 1500)  Labs:  Recent Labs  06/25/14 1937 06/25/14 2251 06/26/14 0229 06/26/14 0408 06/26/14 1108 06/26/14 1430  HGB 14.7  --  13.8 13.0 12.6*  --   HCT 43.4  --  41.3 38.7* 38.0*  --   PLT 228  --  223 216 209  --   LABPROT  --  12.9  --   --   --   --   INR  --  0.97  --   --   --   --   HEPARINUNFRC  --   --   --  0.96*  --  1.00*  CREATININE 1.11  --  1.08  --   --   --   TROPONINI <0.30  --  0.80*  --   --   --     Estimated Creatinine Clearance: 90.9 ml/min (by C-G formula based on Cr of 1.08).     Assessment: 23 YOM with new bilateral PEs to continue on cath directed tPA infusion through ~1700 today.  Patient is also on a heparin gtt and heparin level is supra-therapeutic.  RN reports patient is bleeding minimally at the groin site and PA as well as IR are aware.   Goal of Therapy:  Heparin level 0.3-0.7 units/ml Monitor platelets by anticoagulation protocol: Yes    Plan:  - Decrease heparin gtt to 1350 units/hr - Check 6 hr HL - Daily HL / CBC - F/U for bleeding resolution     D. Mina Marble, PharmD, BCPS Pager:  [phone] 06/26/2014, 4:51 PM    ======================================   Addendum: - heparin was turned off around 1900 for right groin sheath removal - heparin infusion to resume at 2200, 2 hrs post procedure per IR - dressing was changed while patient was off the floor and no further bleeding per RN - reschedule heparin level     D. Mina Marble, PharmD, BCPS Pager:  [phone] 06/26/2014, 9:10 PM

## 2014-06-26 NOTE — Progress Notes (Signed)
Utilization review completed.  , RN, BSN. 

## 2014-06-26 NOTE — Progress Notes (Signed)
Report given to Marya Amsler, RN on 2M10.  Dressing to rt groin after manual pressure complete.

## 2014-06-26 NOTE — Progress Notes (Signed)
INITIAL PRESENTATION: 63 y.o. M brought to [facility] ED on 9/10 with SOB x 1 month, progressively worse over past week.In ED, found to have bilateral PE with possible right heart strain. PCCM was consulted and pt transferred to [facility] ICU.  STUDIES:  CTA Chest 9/10 >>> bilateral PE with mod - large clot burden, elevated RV to LV ratio, possible right heart strain. 1.5cm subpleural nodule at left lung base.  SIGNIFICANT EVENTS:  9/10 - presented to [facility] ED with SOB x 1 month, found to have b/l PE, transferred to [facility] ICU. 9/10 EKOS directed tPA initiated  SUBJ: Somnolent. NAD. 100% on NRB.   OBJDanley Danker Vitals:   06/26/14 1254 06/26/14 1300 06/26/14 1400 06/26/14 1500  BP:  160/64 162/65 156/66  Pulse:  69 73 73  Temp: 98 F (36.7 C)     TempSrc: Oral     Resp:  17 11 17   Height:      Weight:      SpO2:  97% 95% 97%   NAD No JVD noted  Chest clear RRR NABS, soft No LE edema  I have reviewed all of today's lab results. Relevant abnormalities are discussed in the A/P section   IMPRESSION: Bilateral PE with mod - large clot burden and CT evidence of right heart strain  Pulmonary nodule - 1.5cm left lung base HTN  PLAN: Complete cath directed tPA Then initiate heparin Diet ordered 9/11 Likely transfer out of ICU 9/12  Merton Border, MD ; [facility] service Mobile [phone].  After 5:30 PM or weekends, call [phone]

## 2014-06-26 NOTE — Sedation Documentation (Signed)
LPP - 27/13 mean 18 per MD

## 2014-06-26 NOTE — Procedures (Signed)
Successful insertion of bilateral EKOS infusion caths for PE lysis No comp Initial PA pressure 27/13(18) 12 hr bilateral infusion at 1mg  tpa per cath(total dose 24mg )

## 2014-06-27 ENCOUNTER — Inpatient Hospital Stay (HOSPITAL_COMMUNITY): Payer: Medicare Other

## 2014-06-27 DIAGNOSIS — I517 Cardiomegaly: Secondary | ICD-10-CM

## 2014-06-27 DIAGNOSIS — I2699 Other pulmonary embolism without acute cor pulmonale: Secondary | ICD-10-CM

## 2014-06-27 LAB — GLUCOSE, CAPILLARY
Glucose-Capillary: 82 mg/dL (ref 70–99)
Glucose-Capillary: 86 mg/dL (ref 70–99)

## 2014-06-27 LAB — CBC
HCT: 36.6 % — ABNORMAL LOW (ref 39.0–52.0)
HCT: 38.7 % — ABNORMAL LOW (ref 39.0–52.0)
Hemoglobin: 12 g/dL — ABNORMAL LOW (ref 13.0–17.0)
Hemoglobin: 12.6 g/dL — ABNORMAL LOW (ref 13.0–17.0)
MCH: 27.5 pg (ref 26.0–34.0)
MCH: 27.8 pg (ref 26.0–34.0)
MCHC: 32.8 g/dL (ref 30.0–36.0)
MCHC: 32.6 g/dL (ref 30.0–36.0)
MCV: 83.8 fL (ref 78.0–100.0)
MCV: 85.4 fL (ref 78.0–100.0)
Platelets: 178 10*3/uL (ref 150–400)
Platelets: 170 10*3/uL (ref 150–400)
RBC: 4.37 MIL/uL (ref 4.22–5.81)
RBC: 4.53 MIL/uL (ref 4.22–5.81)
RDW: 14.3 % (ref 11.5–15.5)
RDW: 14.6 % (ref 11.5–15.5)
WBC: 6.1 10*3/uL (ref 4.0–10.5)
WBC: 6 10*3/uL (ref 4.0–10.5)

## 2014-06-27 LAB — BASIC METABOLIC PANEL
Anion gap: 12 (ref 5–15)
BUN: 12 mg/dL (ref 6–23)
CO2: 19 mEq/L (ref 19–32)
Calcium: 8.3 mg/dL — ABNORMAL LOW (ref 8.4–10.5)
Chloride: 108 mEq/L (ref 96–112)
Creatinine, Ser: 0.96 mg/dL (ref 0.50–1.35)
GFR calc Af Amer: >90 mL/min (ref >90)
GFR calc non Af Amer: 85 mL/min — ABNORMAL LOW (ref >90)
Glucose, Bld: 81 mg/dL (ref 70–99)
Potassium: 4.4 mEq/L (ref 3.7–5.3)
Sodium: 139 mEq/L (ref 137–147)

## 2014-06-27 LAB — HEPARIN LEVEL (UNFRACTIONATED)
Heparin Unfractionated: 0.56 IU/mL (ref 0.30–0.70)
Heparin Unfractionated: 0.7 IU/mL (ref 0.30–0.70)
Heparin Unfractionated: 0.55 IU/mL (ref 0.30–0.70)

## 2014-06-27 LAB — FIBRINOGEN
Fibrinogen: 413 mg/dL (ref 204–475)
Fibrinogen: 410 mg/dL (ref 204–475)

## 2014-06-27 MED ORDER — IOHEXOL 300 MG/ML  SOLN
50.0000 mL | Freq: Once | INTRAMUSCULAR | Status: AC | PRN
  Administered 2014-06-27: 1 mL via INTRAVENOUS

## 2014-06-27 MED ORDER — HEPARIN (PORCINE) IN NACL 100-0.45 UNIT/ML-% IJ SOLN
1250.0000 [IU]/h | INTRAMUSCULAR | Status: DC
  Administered 2014-06-28 (×2): 1250 [IU]/h via INTRAVENOUS
  Filled 2014-06-27 (×2): qty 250

## 2014-06-27 MED ORDER — MIDAZOLAM HCL 2 MG/2ML IJ SOLN
INTRAMUSCULAR | Status: AC | PRN
  Administered 2014-06-27: 1 mg via INTRAVENOUS

## 2014-06-27 MED ORDER — FENTANYL CITRATE 0.05 MG/ML IJ SOLN
INTRAMUSCULAR | Status: AC
  Filled 2014-06-27: qty 2

## 2014-06-27 MED ORDER — LIDOCAINE HCL 1 % IJ SOLN
INTRAMUSCULAR | Status: AC
  Filled 2014-06-27: qty 20

## 2014-06-27 MED ORDER — MIDAZOLAM HCL 2 MG/2ML IJ SOLN
INTRAMUSCULAR | Status: AC
  Filled 2014-06-27: qty 2

## 2014-06-27 NOTE — Progress Notes (Signed)
ANTICOAGULATION CONSULT NOTE - Follow Up Consult  Pharmacy Consult:  Heparin Indication:  Bilateral PEs  No Known Allergies  Patient Measurements: Height: 6\' 3"  (190.5 cm) Weight: 245 lb 13 oz (111.5 kg) IBW/kg (Calculated) : 84.5 Heparin Dosing Weight: 107 kg  Vital Signs: Temp: 98.4 F (36.9 C) (09/12 0748) Temp src: Oral (09/12 0748) BP: 138/70 mmHg (09/12 0700) Pulse Rate: 67 (09/12 0700)  Labs:  Recent Labs  06/25/14 1937 06/25/14 2251 06/26/14 0229  06/26/14 1430 06/26/14 2026 06/27/14 0148 06/27/14 0430 06/27/14 1010  HGB 14.7  --  13.8  < >  --  12.3* 12.0* 12.6*  --   HCT 43.4  --  41.3  < >  --  37.0* 36.6* 38.7*  --   PLT 228  --  223  < >  --  210 178 170  --   LABPROT  --  12.9  --   --   --   --   --   --   --   INR  --  0.97  --   --   --   --   --   --   --   HEPARINUNFRC  --   --   --   < > 1.00*  --   --  0.56 0.70  CREATININE 1.11  --  1.08  --   --   --   --  0.96  --   TROPONINI <0.30  --  0.80*  --   --   --   --   --   --   < > = values in this interval not displayed.  Estimated Creatinine Clearance: 103.4 ml/min (by C-G formula based on Cr of 0.96).     Assessment: 72 YOM with new bilateral PEs s/p catheter directed thrombolysis on 9/11.  Heparin level is at goal (HL= 0.7) on 1350 units/hr.  Prior level this am was 0.56. There has been noted of groin site bleed on 9/11 but this has been resolved.  Goal of Therapy:  Heparin level 0.3-0.7 units/ml Monitor platelets by anticoagulation protocol: Yes    Plan:  -Decrease heparin to 1250 units/hr -Recheck a heparin level in am -Will follow anticoagulation plans  Hildred Laser, Pharm D 06/27/2014 11:28 AM

## 2014-06-27 NOTE — Progress Notes (Signed)
ANTICOAGULATION CONSULT NOTE - Follow Up Consult  Pharmacy Consult for heparin Indication: pulmonary embolus  Labs:  Recent Labs  06/25/14 1937 06/25/14 2251 06/26/14 0229 06/26/14 0408  06/26/14 1430 06/26/14 2026 06/27/14 0148 06/27/14 0430  HGB 14.7  --  13.8 13.0  < >  --  12.3* 12.0* 12.6*  HCT 43.4  --  41.3 38.7*  < >  --  37.0* 36.6* 38.7*  PLT 228  --  223 216  < >  --  210 178 170  LABPROT  --  12.9  --   --   --   --   --   --   --   INR  --  0.97  --   --   --   --   --   --   --   HEPARINUNFRC  --   --   --  0.96*  --  1.00*  --   --  0.56  CREATININE 1.11  --  1.08  --   --   --   --   --   --   TROPONINI <0.30  --  0.80*  --   --   --   --   --   --   < > = values in this interval not displayed.   Assessment/Plan:  63yo male therapeutic on heparin after rate adjustment, sheath now removed s/p lysis.  Noted that heparin gtt was off x3h last pm until 2200 w/ no infusion issues since then.  Bleeding has resolved w/ stable H/H. Will continue gtt at current rate and confirm stable with additional level.   Wynona Neat, PharmD, BCPS  06/27/2014,5:30 AM

## 2014-06-27 NOTE — Sedation Documentation (Signed)
Informed Dr Vernard Gambles pt ate lunch will sedate with one drug only

## 2014-06-27 NOTE — Progress Notes (Signed)
Subjective: George Matthews is a 63 y.o. male who presented to [facility] on 9/10 c/o shortness of breath getting worse over the past week and oxygen saturation was 80% on RA. CTA revealed submassive bilateral PE with elevated RV/LV ratio, he was requiring a NRB on admission and denies ever being on oxygen at home. He underwent successful insertion of bilateral EKOS infusion caths for PE lysis on 9/11 at 0500 am with re-evaluation of pressures and catheter removal on 9/11 evening. He denies any right groin site pain or chest pain. He states his shortness of breath is more improved and has minimal symptoms. He does c/o slight lightheadedness. He is now on RA.  Allergies: Review of patient's allergies indicates no known allergies.  Medications: Prior to Admission medications   Medication Sig Start Date End Date Taking? Authorizing Provider  acetaminophen (TYLENOL) 500 MG tablet Take 500 mg by mouth every 6 (six) hours as needed for mild pain or moderate pain.   Yes Historical Provider, MD  hydrochlorothiazide (MICROZIDE) 12.5 MG capsule Take 12.5 mg by mouth every morning.    Yes Historical Provider, MD  ibuprofen (ADVIL,MOTRIN) 200 MG tablet Take 200 mg by mouth every 6 (six) hours as needed for mild pain or moderate pain.   Yes Historical Provider, MD   Review of Systems Respiratory: Negative for chest tightness.  Cardiovascular: Negative for chest pain.  Genitourinary: Negative for hematuria.  Musculoskeletal:  Denies right groin pain.   Vital Signs: BP 138/70  Pulse 67  Temp(Src) 98.7 F (37.1 C) (Oral)  Resp 26  Ht '6\' 3"'  (1.905 m)  Wt 245 lb 13 oz (111.5 kg)  BMI 30.72 kg/m2  SpO2 100%  Physical Exam General: A&Ox3, on RA  Heart: RRR without m/g/r  Lungs: CTA bilaterally.  Abd: Soft, NT, ND  Ext: RCFA access site dressing non-bloody, soft, NT, DP intact  Imaging: Dg Chest 2 View  06/25/2014   CLINICAL DATA:  Shortness of breath  EXAM: CHEST  2 VIEW  COMPARISON:  None.   FINDINGS: The heart size and mediastinal contours are within normal limits. Both lungs are clear. The visualized skeletal structures are unremarkable.  IMPRESSION: No active cardiopulmonary disease.   Electronically Signed   By: Inez Catalina M.D.   On: 06/25/2014 21:18   Ct Angio Chest Pe W/cm &/or Wo Cm  06/25/2014   CLINICAL DATA:  Progressive shortness of breath  EXAM: CT ANGIOGRAPHY CHEST WITH CONTRAST  TECHNIQUE: Multidetector CT imaging of the chest was performed using the standard protocol during bolus administration of intravenous contrast. Multiplanar CT image reconstructions and MIPs were obtained to evaluate the vascular anatomy.  CONTRAST:  138m OMNIPAQUE IOHEXOL 350 MG/ML SOLN  COMPARISON:  Chest radiograph dated 06/25/2014  FINDINGS: Bilateral pulmonary emboli with a small amount of saddle embolus at the bifurcation (series 8/image 143), extensive clot in the left main pulmonary artery (series 8/ image 125), and additional lobe are thrombus of the bifurcation of the right upper, middle, and lower pulmonary arteries (series 8/image 169). Overall clot burden is moderate to large.  Elevated RV to LV ratio (1.28), raising the possibility of right heart strain.  1.5 cm patchy subpleural nodule at the left lung base (series 5/ image 73). No pleural effusion or pneumothorax.  Visualized thyroid is unremarkable.  Heart is normal in size. No pericardial effusion. Coronary atherosclerosis. Atherosclerotic calcifications of the aortic arch.  Visualized upper abdomen is unremarkable.  Degenerative changes of the visualized thoracolumbar spine.  Review  of the MIP images confirms the above findings.  IMPRESSION: Bilateral pulmonary emboli, as above. Overall clot burden is moderate to large. Elevated RV to LV ratio, raising the possibility of right heart strain.  Positive for acute PE with CT evidence of right heart strain (RV/LV Ratio = 1.28) consistent with at least submassive (intermediate risk) PE. The  presence of right heart strain has been associated with an increased risk of morbidity and mortality.  1.5 cm subpleural nodule at the left lung base. Initial follow-up CT chest is suggested in 3 months.  This recommendation follows the consensus statement: Guidelines for Management of Small Pulmonary Nodules Detected on CT Scans: A Statement from the [place] as published in Radiology 2005; 237:395-400.  Critical value/emergent results were called by telephone at the time of interpretation on 06/25/2014 at 10:15 pm to Dr. Tanna Furry , who verbally acknowledged these results.   Electronically Signed   By: Julian Hy M.D.   On: 06/25/2014 22:18   Ir Angiogram Pulmonary Bilateral Selective  06/26/2014   CLINICAL DATA:  Acute sub massive pulmonary embolus, hypoxia, tachypnea, evidence of right ventricular heart strain  EXAM: ULTRASOUND GUIDANCE FOR VASCULAR ACCESS  BILATERAL PULMONARY ARTERY CATHETERIZATIONS AND ANGIOGRAMS WITH PRESSURE MEASUREMENT  INSERTION OF EKOS ULTRASOUND ASSISTED INFUSION CATHETERS FOR PULMONARY EMBOLUS THROMBOLYSIS  Date:  9/11/20159/08/2014 5:33 am  Radiologist:  M. Daryll Brod, MD  Guidance:  Ultrasound and fluoroscopic  FLUOROSCOPY TIME:  9 min 54 seconds  MEDICATIONS AND MEDICAL HISTORY: 0.5 mg Versed, 25 mcg fentanyl  ANESTHESIA/SEDATION: 30 min  CONTRAST:  71m OMNIPAQUE IOHEXOL 300 MG/ML  SOLN  COMPLICATIONS: No immediate  PROCEDURE: Informed consent was obtained from the patient following explanation of the procedure, risks, benefits and alternatives. The patient understands, agrees and consents for the procedure. All questions were addressed. A time out was performed.  Maximal barrier sterile technique utilized including caps, mask, sterile gowns, sterile gloves, large sterile drape, hand hygiene, and Betadine.  Under sterile conditions and local anesthesia, ultrasound micropuncture right common femoral venous access was performed. Over a guidewire a 6 French sheath  was inserted. In a similar fashion, a second right common femoral vein 6 French sheath was inserted in a tandem fashion.  Through the inferior sheath, a 100 cm vert catheter was advanced over a Bentson guidewire and utilized to select the left lower lobe pulmonary artery. Selective pulmonary angiogram performed. Initial pressure measurement obtained. Right pulmonary artery pressure: 27/ 13 (18).  Limited pulmonary angiogram demonstrates central nonocclusive filling defect compatible with pulmonary embolus and correlates with the CTA.  Through the superior sheath, a second 100 cm vert catheter was advanced over a Bentson guidewire and utilized to select the right pulmonary artery advancing into the right lower lobe. Limited pulmonary angiogram confirms position.  Rosen guidewires advanced through both of the vertebral catheters. Over the [facility], EKOS ultrasound assisted infusion catheters were advanced into the pulmonary arteries with a 12 cm infusion length on the left and an 18 cm infusion length on the right. Internal ultrasound wires were advanced through the infusion catheters. Positions confirmed with fluoroscopy. Images obtained for documentation.  Patient tolerated the procedure well.  TPA infusion will be initiated at 1 milligram/hour through each catheter over 12 hr (total dose 24 mg).  IMPRESSION: Successful insertion of bilateral pulmonary artery EKOS ultrasound assisted infusion catheters to begin pulmonary embolus thrombolysis.   Electronically Signed   By: TDaryll BrodM.D.   On: 06/26/2014 08:45   Ir Angiogram  Selective Each Additional Vessel  06/26/2014   CLINICAL DATA:  Acute sub massive pulmonary embolus, hypoxia, tachypnea, evidence of right ventricular heart strain  EXAM: ULTRASOUND GUIDANCE FOR VASCULAR ACCESS  BILATERAL PULMONARY ARTERY CATHETERIZATIONS AND ANGIOGRAMS WITH PRESSURE MEASUREMENT  INSERTION OF EKOS ULTRASOUND ASSISTED INFUSION CATHETERS FOR PULMONARY EMBOLUS  THROMBOLYSIS  Date:  9/11/20159/08/2014 5:33 am  Radiologist:  M. Daryll Brod, MD  Guidance:  Ultrasound and fluoroscopic  FLUOROSCOPY TIME:  9 min 54 seconds  MEDICATIONS AND MEDICAL HISTORY: 0.5 mg Versed, 25 mcg fentanyl  ANESTHESIA/SEDATION: 30 min  CONTRAST:  72m OMNIPAQUE IOHEXOL 300 MG/ML  SOLN  COMPLICATIONS: No immediate  PROCEDURE: Informed consent was obtained from the patient following explanation of the procedure, risks, benefits and alternatives. The patient understands, agrees and consents for the procedure. All questions were addressed. A time out was performed.  Maximal barrier sterile technique utilized including caps, mask, sterile gowns, sterile gloves, large sterile drape, hand hygiene, and Betadine.  Under sterile conditions and local anesthesia, ultrasound micropuncture right common femoral venous access was performed. Over a guidewire a 6 French sheath was inserted. In a similar fashion, a second right common femoral vein 6 French sheath was inserted in a tandem fashion.  Through the inferior sheath, a 100 cm vert catheter was advanced over a Bentson guidewire and utilized to select the left lower lobe pulmonary artery. Selective pulmonary angiogram performed. Initial pressure measurement obtained. Right pulmonary artery pressure: 27/ 13 (18).  Limited pulmonary angiogram demonstrates central nonocclusive filling defect compatible with pulmonary embolus and correlates with the CTA.  Through the superior sheath, a second 100 cm vert catheter was advanced over a Bentson guidewire and utilized to select the right pulmonary artery advancing into the right lower lobe. Limited pulmonary angiogram confirms position.  Rosen guidewires advanced through both of the vertebral catheters. Over the [facility], EKOS ultrasound assisted infusion catheters were advanced into the pulmonary arteries with a 12 cm infusion length on the left and an 18 cm infusion length on the right. Internal ultrasound  wires were advanced through the infusion catheters. Positions confirmed with fluoroscopy. Images obtained for documentation.  Patient tolerated the procedure well.  TPA infusion will be initiated at 1 milligram/hour through each catheter over 12 hr (total dose 24 mg).  IMPRESSION: Successful insertion of bilateral pulmonary artery EKOS ultrasound assisted infusion catheters to begin pulmonary embolus thrombolysis.   Electronically Signed   By: TDaryll BrodM.D.   On: 06/26/2014 08:45   Ir [place] Vasc Access Right  06/26/2014   CLINICAL DATA:  Acute sub massive pulmonary embolus, hypoxia, tachypnea, evidence of right ventricular heart strain  EXAM: ULTRASOUND GUIDANCE FOR VASCULAR ACCESS  BILATERAL PULMONARY ARTERY CATHETERIZATIONS AND ANGIOGRAMS WITH PRESSURE MEASUREMENT  INSERTION OF EKOS ULTRASOUND ASSISTED INFUSION CATHETERS FOR PULMONARY EMBOLUS THROMBOLYSIS  Date:  9/11/20159/08/2014 5:33 am  Radiologist:  M. TDaryll Brod MD  Guidance:  Ultrasound and fluoroscopic  FLUOROSCOPY TIME:  9 min 54 seconds  MEDICATIONS AND MEDICAL HISTORY: 0.5 mg Versed, 25 mcg fentanyl  ANESTHESIA/SEDATION: 30 min  CONTRAST:  368mOMNIPAQUE IOHEXOL 300 MG/ML  SOLN  COMPLICATIONS: No immediate  PROCEDURE: Informed consent was obtained from the patient following explanation of the procedure, risks, benefits and alternatives. The patient understands, agrees and consents for the procedure. All questions were addressed. A time out was performed.  Maximal barrier sterile technique utilized including caps, mask, sterile gowns, sterile gloves, large sterile drape, hand hygiene, and Betadine.  Under sterile conditions and local  anesthesia, ultrasound micropuncture right common femoral venous access was performed. Over a guidewire a 6 French sheath was inserted. In a similar fashion, a second right common femoral vein 6 French sheath was inserted in a tandem fashion.  Through the inferior sheath, a 100 cm vert catheter was advanced  over a Bentson guidewire and utilized to select the left lower lobe pulmonary artery. Selective pulmonary angiogram performed. Initial pressure measurement obtained. Right pulmonary artery pressure: 27/ 13 (18).  Limited pulmonary angiogram demonstrates central nonocclusive filling defect compatible with pulmonary embolus and correlates with the CTA.  Through the superior sheath, a second 100 cm vert catheter was advanced over a Bentson guidewire and utilized to select the right pulmonary artery advancing into the right lower lobe. Limited pulmonary angiogram confirms position.  Rosen guidewires advanced through both of the vertebral catheters. Over the [facility] guidewires, EKOS ultrasound assisted infusion catheters were advanced into the pulmonary arteries with a 12 cm infusion length on the left and an 18 cm infusion length on the right. Internal ultrasound wires were advanced through the infusion catheters. Positions confirmed with fluoroscopy. Images obtained for documentation.  Patient tolerated the procedure well.  TPA infusion will be initiated at 1 milligram/hour through each catheter over 12 hr (total dose 24 mg).  IMPRESSION: Successful insertion of bilateral pulmonary artery EKOS ultrasound assisted infusion catheters to begin pulmonary embolus thrombolysis.   Electronically Signed   By: Daryll Brod M.D.   On: 06/26/2014 08:45   Ir Infusion Thrombol Arterial Initial (ms)  06/26/2014   CLINICAL DATA:  Acute sub massive pulmonary embolus, hypoxia, tachypnea, evidence of right ventricular heart strain  EXAM: ULTRASOUND GUIDANCE FOR VASCULAR ACCESS  BILATERAL PULMONARY ARTERY CATHETERIZATIONS AND ANGIOGRAMS WITH PRESSURE MEASUREMENT  INSERTION OF EKOS ULTRASOUND ASSISTED INFUSION CATHETERS FOR PULMONARY EMBOLUS THROMBOLYSIS  Date:  9/11/20159/08/2014 5:33 am  Radiologist:  M. Daryll Brod, MD  Guidance:  Ultrasound and fluoroscopic  FLUOROSCOPY TIME:  9 min 54 seconds  MEDICATIONS AND MEDICAL HISTORY: 0.5  mg Versed, 25 mcg fentanyl  ANESTHESIA/SEDATION: 30 min  CONTRAST:  38m OMNIPAQUE IOHEXOL 300 MG/ML  SOLN  COMPLICATIONS: No immediate  PROCEDURE: Informed consent was obtained from the patient following explanation of the procedure, risks, benefits and alternatives. The patient understands, agrees and consents for the procedure. All questions were addressed. A time out was performed.  Maximal barrier sterile technique utilized including caps, mask, sterile gowns, sterile gloves, large sterile drape, hand hygiene, and Betadine.  Under sterile conditions and local anesthesia, ultrasound micropuncture right common femoral venous access was performed. Over a guidewire a 6 French sheath was inserted. In a similar fashion, a second right common femoral vein 6 French sheath was inserted in a tandem fashion.  Through the inferior sheath, a 100 cm vert catheter was advanced over a Bentson guidewire and utilized to select the left lower lobe pulmonary artery. Selective pulmonary angiogram performed. Initial pressure measurement obtained. Right pulmonary artery pressure: 27/ 13 (18).  Limited pulmonary angiogram demonstrates central nonocclusive filling defect compatible with pulmonary embolus and correlates with the CTA.  Through the superior sheath, a second 100 cm vert catheter was advanced over a Bentson guidewire and utilized to select the right pulmonary artery advancing into the right lower lobe. Limited pulmonary angiogram confirms position.  Rosen guidewires advanced through both of the vertebral catheters. Over the [facility], EKOS ultrasound assisted infusion catheters were advanced into the pulmonary arteries with a 12 cm infusion length on the left and an 18 cm  infusion length on the right. Internal ultrasound wires were advanced through the infusion catheters. Positions confirmed with fluoroscopy. Images obtained for documentation.  Patient tolerated the procedure well.  TPA infusion will be initiated at  1 milligram/hour through each catheter over 12 hr (total dose 24 mg).  IMPRESSION: Successful insertion of bilateral pulmonary artery EKOS ultrasound assisted infusion catheters to begin pulmonary embolus thrombolysis.   Electronically Signed   By: Daryll Brod M.D.   On: 06/26/2014 08:45   Ir Jacolyn Reedy F/u Eval Art/ven Final Day (ms)  06/26/2014   CLINICAL DATA:  63 year old male with acute sub massive pulmonary embolus, hypoxia, tachypnea and evidence of right heart strain.  EXAM: IR THROMB F/U EVAL ART/VEN FINAL DAY  Date: 06/26/2014  PROCEDURE: 1. Measured pulmonary arterial pressures 2. Removal of he goes catheters and right femoral vein sheaths Interventional Radiologist:  Criselda Peaches, MD  ANESTHESIA/SEDATION: None required  FLUOROSCOPY TIME:  54 seconds  CONTRAST:  None  TECHNIQUE: Informed consent was obtained from the patient following explanation of the procedure, risks, benefits and alternatives. The patient understands, agrees and consents for the procedure. All questions were addressed. A time out was performed.  A fluoroscopic spot image was obtained confirming stable positioning of the EKOS catheters. The ultrasound cores were removed. Pulmonary arterial pressures were obtained as follows:  Right main pulmonary artery:  27 mmHg  Left main pulmonary artery:  24 mmHg  Main pulmonary artery:  23 mmHg  The catheters and sheaths were removed and hemostasis was attained by manual pressure. The patient tolerated the procedure well.  COMPLICATIONS: None  IMPRESSION: 1. Successful completion of ultrasound accelerated thrombolysis of bilateral pulmonary emboli. 2. Pulmonary arterial pressures are at the upper limits of normal. The pressures are actually elevated compared to the pre lysis values which were within normal limits at a mean of 18 mmHg. Given the patient's marked clinical improvement, I suspect that the pre lysis pressure measurement was artificially low. Signed,  Criselda Peaches, MD   [facility]  [facility]   Electronically Signed   By: Jacqulynn Cadet M.D.   On: 06/26/2014 20:11    Labs: Results for orders placed during the hospital encounter of 06/25/14 (from the past 48 hour(s))  BASIC METABOLIC PANEL     Status: Abnormal   Collection Time    06/25/14  7:37 PM      Result Value Ref Range   Sodium 140  137 - 147 mEq/L   Potassium 3.8  3.7 - 5.3 mEq/L   Chloride 101  96 - 112 mEq/L   CO2 26  19 - 32 mEq/L   Glucose, Bld 93  70 - 99 mg/dL   BUN 16  6 - 23 mg/dL   Creatinine, Ser 1.11  0.50 - 1.35 mg/dL   Calcium 9.2  8.4 - 10.5 mg/dL   GFR calc non Af Amer 68 (*) >90 mL/min   GFR calc Af Amer 79 (*) >90 mL/min   Comment: (NOTE)     The eGFR has been calculated using the CKD EPI equation.     This calculation has not been validated in all clinical situations.     eGFR's persistently <90 mL/min signify possible Chronic Kidney     Disease.   Anion gap 13  5 - 15  CBC WITH DIFFERENTIAL     Status: None   Collection Time    06/25/14  7:37 PM      Result Value Ref  Range   WBC 5.3  4.0 - 10.5 K/uL   RBC 5.14  4.22 - 5.81 MIL/uL   Hemoglobin 14.7  13.0 - 17.0 g/dL   HCT 43.4  39.0 - 52.0 %   MCV 84.4  78.0 - 100.0 fL   MCH 28.6  26.0 - 34.0 pg   MCHC 33.9  30.0 - 36.0 g/dL   RDW 14.4  11.5 - 15.5 %   Platelets 228  150 - 400 K/uL   Neutrophils Relative % 51  43 - 77 %   Neutro Abs 2.7  1.7 - 7.7 K/uL   Lymphocytes Relative 36  12 - 46 %   Lymphs Abs 1.9  0.7 - 4.0 K/uL   Monocytes Relative 7  3 - 12 %   Monocytes Absolute 0.4  0.1 - 1.0 K/uL   Eosinophils Relative 5  0 - 5 %   Eosinophils Absolute 0.3  0.0 - 0.7 K/uL   Basophils Relative 1  0 - 1 %   Basophils Absolute 0.0  0.0 - 0.1 K/uL  TROPONIN I     Status: None   Collection Time    06/25/14  7:37 PM      Result Value Ref Range   Troponin I <0.30  <0.30 ng/mL   Comment:            Due to the release kinetics of cTnI,     a negative result within  the first hours     of the onset of symptoms does not rule out     myocardial infarction with certainty.     If myocardial infarction is still suspected,     repeat the test at appropriate intervals.  PRO B NATRIURETIC PEPTIDE     Status: None   Collection Time    06/25/14  7:37 PM      Result Value Ref Range   Pro B Natriuretic peptide (BNP) 80.1  0 - 125 pg/mL  BLOOD GAS, ARTERIAL     Status: Abnormal   Collection Time    06/25/14  9:57 PM      Result Value Ref Range   FIO2 1.00     Delivery systems NON-REBREATHER OXYGEN MASK     pH, Arterial 7.423  7.350 - 7.450   pCO2 arterial 36.4  35.0 - 45.0 mmHg   pO2, Arterial 60.6 (*) 80.0 - 100.0 mmHg   Bicarbonate 23.3  20.0 - 24.0 mEq/L   TCO2 20.6  0 - 100 mmol/L   Acid-base deficit 0.5  0.0 - 2.0 mmol/L   O2 Saturation 90.5     Patient temperature 37.0     Collection site RIGHT RADIAL     Drawn by [phone]     Sample type ARTERIAL DRAW     Allens test (pass/fail) PASS  PASS  PROTIME-INR     Status: None   Collection Time    06/25/14 10:51 PM      Result Value Ref Range   Prothrombin Time 12.9  11.6 - 15.2 seconds   INR 0.97  0.00 - 1.49  MRSA PCR SCREENING     Status: None   Collection Time    06/26/14  1:38 AM      Result Value Ref Range   MRSA by PCR NEGATIVE  NEGATIVE   Comment:            The GeneXpert MRSA Assay (FDA     approved for NASAL specimens     only),  is one component of a     comprehensive MRSA colonization     surveillance program. It is not     intended to diagnose MRSA     infection nor to guide or     monitor treatment for     MRSA infections.  CBC     Status: None   Collection Time    06/26/14  2:29 AM      Result Value Ref Range   WBC 6.3  4.0 - 10.5 K/uL   RBC 4.92  4.22 - 5.81 MIL/uL   Hemoglobin 13.8  13.0 - 17.0 g/dL   HCT 41.3  39.0 - 52.0 %   MCV 83.9  78.0 - 100.0 fL   MCH 28.0  26.0 - 34.0 pg   MCHC 33.4  30.0 - 36.0 g/dL   RDW 14.3  11.5 - 15.5 %   Platelets 223  150 - 400 K/uL    BASIC METABOLIC PANEL     Status: Abnormal   Collection Time    06/26/14  2:29 AM      Result Value Ref Range   Sodium 139  137 - 147 mEq/L   Potassium 4.1  3.7 - 5.3 mEq/L   Chloride 100  96 - 112 mEq/L   CO2 25  19 - 32 mEq/L   Glucose, Bld 109 (*) 70 - 99 mg/dL   BUN 14  6 - 23 mg/dL   Creatinine, Ser 1.08  0.50 - 1.35 mg/dL   Calcium 9.1  8.4 - 10.5 mg/dL   GFR calc non Af Amer 70 (*) >90 mL/min   GFR calc Af Amer 81 (*) >90 mL/min   Comment: (NOTE)     The eGFR has been calculated using the CKD EPI equation.     This calculation has not been validated in all clinical situations.     eGFR's persistently <90 mL/min signify possible Chronic Kidney     Disease.   Anion gap 14  5 - 15  MAGNESIUM     Status: None   Collection Time    06/26/14  2:29 AM      Result Value Ref Range   Magnesium 2.1  1.5 - 2.5 mg/dL  PHOSPHORUS     Status: None   Collection Time    06/26/14  2:29 AM      Result Value Ref Range   Phosphorus 3.5  2.3 - 4.6 mg/dL  HOMOCYSTEINE     Status: Abnormal   Collection Time    06/26/14  2:29 AM      Result Value Ref Range   Homocysteine 16.4 (*) 4.0 - 15.4 umol/L   Comment: Performed at [facility]  TROPONIN I     Status: Abnormal   Collection Time    06/26/14  2:29 AM      Result Value Ref Range   Troponin I 0.80 (*) <0.30 ng/mL   Comment:            Due to the release kinetics of cTnI,     a negative result within the first hours     of the onset of symptoms does not rule out     myocardial infarction with certainty.     If myocardial infarction is still suspected,     repeat the test at appropriate intervals.     CRITICAL RESULT CALLED TO, READ BACK BY AND VERIFIED WITH:     HARDUK,G RN 06/26/2014 0333 JORDANS  CBC  Status: Abnormal   Collection Time    06/26/14  4:08 AM      Result Value Ref Range   WBC 5.2  4.0 - 10.5 K/uL   RBC 4.66  4.22 - 5.81 MIL/uL   Hemoglobin 13.0  13.0 - 17.0 g/dL   HCT 38.7 (*) 39.0 - 52.0 %   MCV  83.0  78.0 - 100.0 fL   MCH 27.9  26.0 - 34.0 pg   MCHC 33.6  30.0 - 36.0 g/dL   RDW 14.5  11.5 - 15.5 %   Platelets 216  150 - 400 K/uL  HEPARIN LEVEL (UNFRACTIONATED)     Status: Abnormal   Collection Time    06/26/14  4:08 AM      Result Value Ref Range   Heparin Unfractionated 0.96 (*) 0.30 - 0.70 IU/mL   Comment:            IF HEPARIN RESULTS ARE BELOW     EXPECTED VALUES, AND PATIENT     DOSAGE HAS BEEN CONFIRMED,     SUGGEST FOLLOW UP TESTING     OF ANTITHROMBIN III LEVELS.     SLIGHT HEMOLYSIS  GLUCOSE, CAPILLARY     Status: Abnormal   Collection Time    06/26/14  7:53 AM      Result Value Ref Range   Glucose-Capillary 102 (*) 70 - 99 mg/dL  CBC     Status: Abnormal   Collection Time    06/26/14 11:08 AM      Result Value Ref Range   WBC 5.3  4.0 - 10.5 K/uL   RBC 4.56  4.22 - 5.81 MIL/uL   Hemoglobin 12.6 (*) 13.0 - 17.0 g/dL   HCT 38.0 (*) 39.0 - 52.0 %   MCV 83.3  78.0 - 100.0 fL   MCH 27.6  26.0 - 34.0 pg   MCHC 33.2  30.0 - 36.0 g/dL   RDW 14.2  11.5 - 15.5 %   Platelets 209  150 - 400 K/uL  FIBRINOGEN     Status: None   Collection Time    06/26/14 11:08 AM      Result Value Ref Range   Fibrinogen 435  204 - 475 mg/dL  GLUCOSE, CAPILLARY     Status: None   Collection Time    06/26/14 12:47 PM      Result Value Ref Range   Glucose-Capillary 84  70 - 99 mg/dL  GLUCOSE, CAPILLARY     Status: None   Collection Time    06/26/14 12:50 PM      Result Value Ref Range   Glucose-Capillary 86  70 - 99 mg/dL  HEPARIN LEVEL (UNFRACTIONATED)     Status: Abnormal   Collection Time    06/26/14  2:30 PM      Result Value Ref Range   Heparin Unfractionated 1.00 (*) 0.30 - 0.70 IU/mL   Comment:            IF HEPARIN RESULTS ARE BELOW     EXPECTED VALUES, AND PATIENT     DOSAGE HAS BEEN CONFIRMED,     SUGGEST FOLLOW UP TESTING     OF ANTITHROMBIN III LEVELS.     RESULTS CONFIRMED BY MANUAL DILUTION  FIBRINOGEN     Status: None   Collection Time    06/26/14   2:30 PM      Result Value Ref Range   Fibrinogen 379  204 - 475 mg/dL  GLUCOSE, CAPILLARY  Status: Abnormal   Collection Time    06/26/14  4:08 PM      Result Value Ref Range   Glucose-Capillary 110 (*) 70 - 99 mg/dL  CBC     Status: Abnormal   Collection Time    06/26/14  8:26 PM      Result Value Ref Range   WBC 6.1  4.0 - 10.5 K/uL   RBC 4.45  4.22 - 5.81 MIL/uL   Hemoglobin 12.3 (*) 13.0 - 17.0 g/dL   HCT 37.0 (*) 39.0 - 52.0 %   MCV 83.1  78.0 - 100.0 fL   MCH 27.6  26.0 - 34.0 pg   MCHC 33.2  30.0 - 36.0 g/dL   RDW 14.3  11.5 - 15.5 %   Platelets 210  150 - 400 K/uL  FIBRINOGEN     Status: None   Collection Time    06/26/14  8:26 PM      Result Value Ref Range   Fibrinogen 394  204 - 475 mg/dL  GLUCOSE, CAPILLARY     Status: None   Collection Time    06/26/14  8:28 PM      Result Value Ref Range   Glucose-Capillary 79  70 - 99 mg/dL  GLUCOSE, CAPILLARY     Status: None   Collection Time    06/27/14 12:02 AM      Result Value Ref Range   Glucose-Capillary 82  70 - 99 mg/dL  CBC     Status: Abnormal   Collection Time    06/27/14  1:48 AM      Result Value Ref Range   WBC 6.1  4.0 - 10.5 K/uL   RBC 4.37  4.22 - 5.81 MIL/uL   Hemoglobin 12.0 (*) 13.0 - 17.0 g/dL   HCT 36.6 (*) 39.0 - 52.0 %   MCV 83.8  78.0 - 100.0 fL   MCH 27.5  26.0 - 34.0 pg   MCHC 32.8  30.0 - 36.0 g/dL   RDW 14.3  11.5 - 15.5 %   Platelets 178  150 - 400 K/uL  FIBRINOGEN     Status: None   Collection Time    06/27/14  1:48 AM      Result Value Ref Range   Fibrinogen 413  204 - 475 mg/dL  GLUCOSE, CAPILLARY     Status: None   Collection Time    06/27/14  4:22 AM      Result Value Ref Range   Glucose-Capillary 86  70 - 99 mg/dL   Comment 1 Documented in Chart     Comment 2 Notify RN    BASIC METABOLIC PANEL     Status: Abnormal   Collection Time    06/27/14  4:30 AM      Result Value Ref Range   Sodium 139  137 - 147 mEq/L   Potassium 4.4  3.7 - 5.3 mEq/L   Chloride 108  96 -  112 mEq/L   CO2 19  19 - 32 mEq/L   Glucose, Bld 81  70 - 99 mg/dL   BUN 12  6 - 23 mg/dL   Creatinine, Ser 0.96  0.50 - 1.35 mg/dL   Calcium 8.3 (*) 8.4 - 10.5 mg/dL   GFR calc non Af Amer 85 (*) >90 mL/min   GFR calc Af Amer >90  >90 mL/min   Comment: (NOTE)     The eGFR has been calculated using the CKD EPI equation.  This calculation has not been validated in all clinical situations.     eGFR's persistently <90 mL/min signify possible Chronic Kidney     Disease.   Anion gap 12  5 - 15  CBC     Status: Abnormal   Collection Time    06/27/14  4:30 AM      Result Value Ref Range   WBC 6.0  4.0 - 10.5 K/uL   RBC 4.53  4.22 - 5.81 MIL/uL   Hemoglobin 12.6 (*) 13.0 - 17.0 g/dL   HCT 38.7 (*) 39.0 - 52.0 %   MCV 85.4  78.0 - 100.0 fL   MCH 27.8  26.0 - 34.0 pg   MCHC 32.6  30.0 - 36.0 g/dL   RDW 14.6  11.5 - 15.5 %   Platelets 170  150 - 400 K/uL  FIBRINOGEN     Status: None   Collection Time    06/27/14  4:30 AM      Result Value Ref Range   Fibrinogen 410  204 - 475 mg/dL  HEPARIN LEVEL (UNFRACTIONATED)     Status: None   Collection Time    06/27/14  4:30 AM      Result Value Ref Range   Heparin Unfractionated 0.56  0.30 - 0.70 IU/mL   Comment:            IF HEPARIN RESULTS ARE BELOW     EXPECTED VALUES, AND PATIENT     DOSAGE HAS BEEN CONFIRMED,     SUGGEST FOLLOW UP TESTING     OF ANTITHROMBIN III LEVELS.  HEPARIN LEVEL (UNFRACTIONATED)     Status: None   Collection Time    06/27/14 10:10 AM      Result Value Ref Range   Heparin Unfractionated 0.70  0.30 - 0.70 IU/mL   Comment:            IF HEPARIN RESULTS ARE BELOW     EXPECTED VALUES, AND PATIENT     DOSAGE HAS BEEN CONFIRMED,     SUGGEST FOLLOW UP TESTING     OF ANTITHROMBIN III LEVELS.    Assessment and Plan: Acute submassive bilateral pulmonary embolism with evidence of elevated RV/LV ratio on CTA possible right heart strain   S/p Successful insertion of bilateral EKOS infusion caths for PE lysis  9/11 0500 for 12 hour infusion, symptoms improved and now on RA with good O2 saturation.  Repeat CTA 9/13.  Tsosie Billing PA-C Interventional Radiology  06/27/14  12:56 PM   I spent a total of 15 minutes face to face in clinical consultation/evaluation, greater than 50% of which was counseling/coordinating care

## 2014-06-27 NOTE — Progress Notes (Signed)
  Echocardiogram 2D Echocardiogram has been performed.  George Matthews 06/27/2014, 9:17 AM

## 2014-06-27 NOTE — Progress Notes (Addendum)
[place] PCCM  Name: George Matthews MRN: [id number]   INITIAL PRESENTATION: 63 y.o. M brought to [facility] ED on 9/10 with SOB x 1 month, progressively worse over past week.In ED, found to have bilateral PE with possible right heart strain. PCCM was consulted and pt transferred to [facility] ICU.   STUDIES:  CTA Chest 9/10 >>> bilateral PE with mod - large clot burden, elevated RV to LV ratio, possible right heart strain. 1.5cm subpleural nodule at left lung base.  9/12 BLE dopplers >>> acute, MOBILE DVT L popliteal   SIGNIFICANT EVENTS:  9/10 - presented to [facility] ED with SOB x 1 month, found to have b/l PE, transferred to [facility] ICU. 9/10 EKOS directed tPA initiated  SUBJ: Feeling better.  Now on RA.  EKOS catheter out.  MOBILE clot just discovered on BLE dopplers.   OBJDanley Matthews Vitals:   06/27/14 0600 06/27/14 0700 06/27/14 0748 06/27/14 1159  BP: 124/72 138/70    Pulse: 58 67    Temp:   98.4 F (36.9 C) 98.7 F (37.1 C)  TempSrc:   Oral Oral  Resp:  26    Height:      Weight: 245 lb 13 oz (111.5 kg)     SpO2: 100% 100%     EXAM:  General: pleasant male, NAD in bed  Neuro: awake, alert, appropriate, MAE CV: s1s2 rrr PULM: resps even, non labored on RA  GI: abd soft, +bs  Extremities: warm and dry, L>R BLE swelling    Recent Labs Lab 06/26/14 2026 06/27/14 0148 06/27/14 0430  HGB 12.3* 12.0* 12.6*  HCT 37.0* 36.6* 38.7*  WBC 6.1 6.1 6.0  PLT 210 178 170    Recent Labs Lab 06/25/14 1937 06/26/14 0229 06/27/14 0430  NA 140 139 139  K 3.8 4.1 4.4  CL 101 100 108  CO2 26 25 19   GLUCOSE 93 109* 81  BUN 16 14 12   CREATININE 1.11 1.08 0.96  CALCIUM 9.2 9.1 8.3*  MG  --  2.1  --   PHOS  --  3.5  --     Recent Labs Lab 06/25/14 2251  INR 0.97   CXR -- no new cxr   IMPRESSION/PLAN   Bilateral PE with mod - large clot burden and CT evidence of right heart strain. S/p EKOS catheter directed lysis Pulmonary nodule - 1.5cm left lung base HTN  PLAN: Will d/w IR -  needs retractable filter x 1 month given large mobile clot after restudy of dopplers then - already with large clot burden Cont heparin gtt per pharmacy   Consider initiate xarelto in am post filter  PRN hydralazine not used dc PRN BD  Cont PO diet as tol  Cont BR with MOBILE clot  F/u CTA chest in am per IR protocol homocysteinen noted, needs cancer screening, ana, facto 5 leiden as over age 64 - acquired hypercoagulable state  George Madrid, NP 06/27/2014  2:51 PM Pager: [phone] or [phone]  *Care during the described time interval was provided by me and/or other providers on the critical care team. I have reviewed this patient's available data, including medical history, events of note, physical examination and test results as part of my evaluation.   George Matthews. George Mould, MD, [place] Pgr: [place] [facility]

## 2014-06-27 NOTE — Sedation Documentation (Signed)
Patient denies pain and is resting comfortably.  

## 2014-06-27 NOTE — Progress Notes (Signed)
VASCULAR LAB PRELIMINARY  PRELIMINARY  PRELIMINARY  PRELIMINARY  Bilateral lower extremity venous Dopplers completed.    Preliminary report:  There is acute, MOBILE deep vein thrombosis noted in the left popliteal vein.  All other veins appear thrombus free.   , , RVT 06/27/2014, 12:29 PM

## 2014-06-28 ENCOUNTER — Encounter (HOSPITAL_COMMUNITY): Payer: Self-pay

## 2014-06-28 ENCOUNTER — Inpatient Hospital Stay (HOSPITAL_COMMUNITY): Payer: Medicare Other

## 2014-06-28 ENCOUNTER — Other Ambulatory Visit: Payer: Self-pay | Admitting: Radiology

## 2014-06-28 DIAGNOSIS — I2699 Other pulmonary embolism without acute cor pulmonale: Secondary | ICD-10-CM

## 2014-06-28 LAB — BASIC METABOLIC PANEL
Anion gap: 12 (ref 5–15)
BUN: 11 mg/dL (ref 6–23)
CO2: 22 mEq/L (ref 19–32)
Calcium: 8.7 mg/dL (ref 8.4–10.5)
Chloride: 104 mEq/L (ref 96–112)
Creatinine, Ser: 1.01 mg/dL (ref 0.50–1.35)
GFR calc Af Amer: 88 mL/min — ABNORMAL LOW (ref >90)
GFR calc non Af Amer: 76 mL/min — ABNORMAL LOW (ref >90)
Glucose, Bld: 103 mg/dL — ABNORMAL HIGH (ref 70–99)
Potassium: 4 mEq/L (ref 3.7–5.3)
Sodium: 138 mEq/L (ref 137–147)

## 2014-06-28 LAB — CBC
HCT: 38.2 % — ABNORMAL LOW (ref 39.0–52.0)
Hemoglobin: 12.6 g/dL — ABNORMAL LOW (ref 13.0–17.0)
MCH: 27.5 pg (ref 26.0–34.0)
MCHC: 33 g/dL (ref 30.0–36.0)
MCV: 83.2 fL (ref 78.0–100.0)
Platelets: 192 10*3/uL (ref 150–400)
RBC: 4.59 MIL/uL (ref 4.22–5.81)
RDW: 14.2 % (ref 11.5–15.5)
WBC: 5.7 10*3/uL (ref 4.0–10.5)

## 2014-06-28 LAB — HEPARIN LEVEL (UNFRACTIONATED): Heparin Unfractionated: 0.6 IU/mL (ref 0.30–0.70)

## 2014-06-28 MED ORDER — IOHEXOL 350 MG/ML SOLN
100.0000 mL | Freq: Once | INTRAVENOUS | Status: AC | PRN
  Administered 2014-06-28: 100 mL via INTRAVENOUS

## 2014-06-28 NOTE — Progress Notes (Signed)
[place] PCCM  Name: George Matthews MRN: [id number]   INITIAL PRESENTATION: 63 yobm  brought to [facility] ED on 9/10 with SOB x 1 month, progressively worse over past week.In ED, found to have bilateral PE with possible right heart strain. PCCM was consulted and pt transferred to [facility] ICU.   STUDIES:  CTA Chest 9/10 >>> bilateral PE with mod - large clot burden, elevated RV to LV ratio, possible right heart strain. 1.5cm subpleural nodule at left lung base.  9/12 BLE dopplers >>> acute, MOBILE DVT L popliteal   SIGNIFICANT EVENTS:  9/10 - presented to [facility] ED with SOB x 1 month, found to have b/l PE, transferred to [facility] ICU. 9/10 EKOS directed tPA initiated  SUBJ: No sob/ cp  - on RA   OBJ: Filed Vitals:   06/27/14 2020 06/28/14 0413 06/28/14 0414 06/28/14 0435  BP: 150/60 171/76 157/68   Pulse: 66 61    Temp: 98.8 F (37.1 C) 98.4 F (36.9 C)    TempSrc: Oral Oral    Resp: 17 18    Height:    6' 3.5" (1.918 m)  Weight:    244 lb (110.678 kg)  SpO2: 96% 96%     EXAM:  General: pleasant male, NAD in bed  Neuro: awake, alert, appropriate, MAE CV: s1s2 rrr PULM: resps even, non labored on RA  GI: abd soft, +bs  Extremities: warm and dry, L>R BLE swelling    Recent Labs Lab 06/27/14 0148 06/27/14 0430 06/28/14 0630  HGB 12.0* 12.6* 12.6*  HCT 36.6* 38.7* 38.2*  WBC 6.1 6.0 5.7  PLT 178 170 192    Recent Labs Lab 06/25/14 1937 06/26/14 0229 06/27/14 0430 06/28/14 0630  NA 140 139 139 138  K 3.8 4.1 4.4 4.0  CL 101 100 108 104  CO2 26 25 19 22   GLUCOSE 93 109* 81 103*  BUN 16 14 12 11   CREATININE 1.11 1.08 0.96 1.01  CALCIUM 9.2 9.1 8.3* 8.7  MG  --  2.1  --   --   PHOS  --  3.5  --   --     Recent Labs Lab 06/25/14 2251  INR 0.97   CXR -- no new cxr   IMPRESSION/PLAN   Bilateral PE with mod - large clot burden and CT evidence of right heart strain. S/p EKOS catheter directed lysis Pulmonary nodule - 1.5cm left lung base HTN  PLAN: Will d/w IR -  needs retractable filter x 1 month given large mobile clot after restudy of dopplers then - already with large clot burden Cont heparin gtt per pharmacy   Consider initiate xarelto  post filter  PRN hydralazine not used dc PRN BD  Cont PO diet as tol  Cont BR with MOBILE clot  F/u CTA chest in am per IR protocol homocysteinen noted, needs cancer screening, ana, facto 5 leiden as over age 11 - acquired hypercoagulable state     Christinia Gully, MD Pulmonary and [place] [phone] After 5:30 PM or weekends, call [phone]

## 2014-06-28 NOTE — Progress Notes (Signed)
ANTICOAGULATION CONSULT NOTE - Follow Up Consult  Pharmacy Consult for Heparin Indication: New acute B/L PE + LLE DVT (mobile clot)  No Known Allergies  Patient Measurements: Height: 6' 3.5" (191.8 cm) Weight: 244 lb (110.678 kg) IBW/kg (Calculated) : 85.65 Heparin Dosing Weight: 108 kg  Vital Signs: Temp: 98.4 F (36.9 C) (09/13 0413) Temp src: Oral (09/13 0413) BP: 157/68 mmHg (09/13 0414) Pulse Rate: 61 (09/13 0413)  Labs:  Recent Labs  06/25/14 1937 06/25/14 2251 06/26/14 0229  06/27/14 0148 06/27/14 0430 06/27/14 1010 06/27/14 1849 06/28/14 0630  HGB 14.7  --  13.8  < > 12.0* 12.6*  --   --  12.6*  HCT 43.4  --  41.3  < > 36.6* 38.7*  --   --  38.2*  PLT 228  --  223  < > 178 170  --   --  192  LABPROT  --  12.9  --   --   --   --   --   --   --   INR  --  0.97  --   --   --   --   --   --   --   HEPARINUNFRC  --   --   --   < >  --  0.56 0.70 0.55 0.60  CREATININE 1.11  --  1.08  --   --  0.96  --   --  1.01  TROPONINI <0.30  --  0.80*  --   --   --   --   --   --   < > = values in this interval not displayed.  Estimated Creatinine Clearance: 98.7 ml/min (by C-G formula based on Cr of 1.01).   Medications:  Heparin @ 1250 units/hr (12.5 ml/hr)  Assessment: 38 YOM with new bilateral PEs s/p catheter directed thrombolysis on 9/11 and also noted on 9/12 to have a LLE mobile DVT - planning for IVC. Heparin level this morning remains therapeutic (HL 0.6 << 0.55, goal of 0.3-0.7). CBC stable. The patient had a groin site bleed on 9/11 but this has been resolved - no further bleeding noted at this time.   Goal of Therapy:  Heparin level 0.3-0.7 units/ml Monitor platelets by anticoagulation protocol: Yes   Plan:  1. Continue heparin at 1250 units/hr (12.5 ml/hr) 2. Will continue to monitor for any signs/symptoms of bleeding and will follow up with heparin level in the a.m.   Alycia Rossetti, PharmD, BCPS Clinical Pharmacist Pager: [phone] 06/28/2014 1:37  PM

## 2014-06-29 ENCOUNTER — Telehealth: Payer: Self-pay | Admitting: Internal Medicine

## 2014-06-29 LAB — LUPUS ANTICOAGULANT PANEL
DRVVT: 34.2 secs (ref <42.9)
Lupus Anticoagulant: NOT DETECTED
PTT Lupus Anticoagulant: 121.9 secs — ABNORMAL HIGH (ref 28.0–43.0)
PTTLA 4:1 Mix: 104.3 secs — ABNORMAL HIGH (ref 28.0–43.0)
PTTLA Confirmation: 2.3 secs (ref <8.0)

## 2014-06-29 LAB — FACTOR 5 LEIDEN

## 2014-06-29 LAB — CBC
HCT: 37 % — ABNORMAL LOW (ref 39.0–52.0)
Hemoglobin: 12.2 g/dL — ABNORMAL LOW (ref 13.0–17.0)
MCH: 27.5 pg (ref 26.0–34.0)
MCHC: 33 g/dL (ref 30.0–36.0)
MCV: 83.3 fL (ref 78.0–100.0)
Platelets: 221 10*3/uL (ref 150–400)
RBC: 4.44 MIL/uL (ref 4.22–5.81)
RDW: 14.2 % (ref 11.5–15.5)
WBC: 6.1 10*3/uL (ref 4.0–10.5)

## 2014-06-29 LAB — PROTHROMBIN GENE MUTATION

## 2014-06-29 LAB — HEPARIN LEVEL (UNFRACTIONATED): Heparin Unfractionated: 0.59 IU/mL (ref 0.30–0.70)

## 2014-06-29 MED ORDER — RIVAROXABAN (XARELTO) VTE STARTER PACK (15 & 20 MG): ORAL_TABLET | ORAL | Status: DC

## 2014-06-29 MED ORDER — RIVAROXABAN 20 MG PO TABS: 20.0000 mg | ORAL_TABLET | Freq: Every day | ORAL | Status: DC

## 2014-06-29 MED ORDER — RIVAROXABAN 15 MG PO TABS
15.0000 mg | ORAL_TABLET | Freq: Once | ORAL | Status: AC
  Administered 2014-06-29: 15 mg via ORAL
  Filled 2014-06-29: qty 1

## 2014-06-29 NOTE — Progress Notes (Signed)
Patient and family provided with discharge information along with education. Patient verbalized understanding and denied having any questions/concerns. PIV's discontinued and patient taken out to personal vehicle by staff volunteers. Alfredo Bach RN BSN 06/29/2014 1:59 PM

## 2014-06-29 NOTE — Care Management Note (Unsigned)
    Page 1 of 1   06/29/2014     3:26:00 PM CARE MANAGEMENT NOTE 06/29/2014  Patient:  George Matthews, George Matthews   Account Number:  [id number]  Date Initiated:  06/29/2014  Documentation initiated by:  ,  Subjective/Objective Assessment:   Pt adm on 06/25/14 with bilateral PE. PTA, pt independent, lives with family.     Action/Plan:   Will follow for dc needs as pt progresses.   Anticipated DC Date:  07/01/2014   Anticipated DC Plan:  [place]  CM consult      Choice offered to / List presented to:             Status of service:  In process, will continue to follow Medicare Important Message given?  YES (If response is "NO", the following Medicare IM given date fields will be blank) Date Medicare IM given:  06/29/2014 Medicare IM given by:  , Date Additional Medicare IM given:   Additional Medicare IM given by:    Discharge Disposition:    Per UR Regulation:  Reviewed for med. necessity/level of care/duration of stay  If discussed at [place] of Stay Meetings, dates discussed:    Comments:

## 2014-06-29 NOTE — Discharge Summary (Signed)
Physician Discharge Summary       Patient ID: George Matthews MRN: [id number] DOB/AGE: 65-Jan-1952 65 y.o.  Admit date: 06/25/2014 Discharge date: 06/29/2014  Discharge Diagnoses:  Bilateral pulmonary emboli w/ right heart strain S/p EKOS catheter directed Lysis 9/11 Mobile Left lower extremity DVT S/p IVC filter 9/12 Pulmonary nodule 1.5cm left lung base   Detailed Hospital Course:   63 y.o.m presented to [facility] via [facility] on 9/10 with SOB. Symptoms had been going on for 1 month but had progressively worsened over the past week prior to admit. On EMS arrival, SpO2 was in 80's on RA, improved to low 90's with NRB. Pt was also hypertensive with SBP in 200's. He was given nitro x 3 with improvement in SBP to 150's.  Pt complained that in addition to SOB, he had been feeling dizzy for the past week. Earlier in the day, he leaned over to pick up something from the floor when he became much more SOB and dizzy to the point that he felt he was about to pass out. No similar symptoms in the past. Is an ex smoker of > 10 years, quite in 1973. Denies any history of VTE, personal or family hx of cancers or coagulopathy, recent long trips or periods of immobility. In ED, CTA chest revealed bilateral PE with mod-large clot burden and possible right heart strain noting his RV/LV ration at 1.28.  PCCM was consulted for transfer to [facility] ICU.   He under went EKOS catheter directed pulmonary artery thrombolysis on 9/11. His initial PAPs were measured at 27/13 (18) mmHg (these were felt to be artificially low).  he returned to the ICU where he continued 12 hour infusion of TPA. !2 hours after he returned to the IR suit where his f/u Mean PAP was again 18 mmHg with in normal limits. Heparin infusion was continued. F/U CT chest on 9/12 showed improved clot burden and RV/LV ration improved to 1.06. He was weaned off Oxygen. Later on 9/12 he had his lower extremity ultrasounds completed. This showed an acute mobile left  popliteal vein thrombus. Because of this he was kept on bedrest and underwent retractable filter later on 9/12. On admit a hypercoagulable panel was sent. To date his Homocysteine level has come back at 16.4. All other tests are pending. He has remained on heparin infusion. Now ambulatory and all cath insertions sites are unremarkable. He was deemed ready for discharge with the plan of care as outlined below.    Discharge Plan by active problems  Bilateral PE with mod - large clot burden and CT evidence of right heart strain. S/p EKOS catheter directed lysis 9/11 Left lower extremity DVT (mobile) s/p IVC filter 9/12   PLAN:  Home on xarelto (will provide starter pack).  F/u with IR 1 month  Needs f/u venous doppler 6-8 weeks.. (typically remove IVCs 3-6 mo s/p placement).  needs cancer screening F/u facto 5 leiden, cardiolipin antibodies, Prothrombin gene mutation, and Lupus anticoagulant as over age 65 - ? acquired hypercoagulable state (these labs are still pending).  Will need minimally 6 months of anticoagulation assuming oncology eval negative .  Pulmonary nodule 1.5cm left lung base  Plan F/u CT chest 3 months.     Significant Hospital tests/ studies   CTA Chest 9/10 >>> bilateral PE with mod - large clot burden, elevated RV to LV ratio, possible right heart strain. 1.5cm subpleural nodule at left lung base.  9/12 BLE dopplers >>> acute, MOBILE DVT  L popliteal  CT chest 9/12:  improved clot burden and RV/LV ration improved to 1.06. Consults : interventional radiology   Discharge Exam: BP 137/52  Pulse 56  Temp(Src) 98 F (36.7 C) (Oral)  Resp 17  Ht 6' 3.5" (1.918 m)  Wt 109.09 kg (240 lb 8 oz)  BMI 29.65 kg/m2  SpO2 96% General: pleasant male, NAD  Neuro: awake, alert, appropriate, MAE  CV: s1s2 rrr  PULM: resps even, non labored on RA  GI: abd soft, +bs  Extremities: warm and dry, L>R BLE swelling    Labs at discharge Lab Results  Component Value Date    CREATININE 1.01 06/28/2014   BUN 11 06/28/2014   NA 138 06/28/2014   K 4.0 06/28/2014   CL 104 06/28/2014   CO2 22 06/28/2014   Lab Results  Component Value Date   WBC 6.1 06/29/2014   HGB 12.2* 06/29/2014   HCT 37.0* 06/29/2014   MCV 83.3 06/29/2014   PLT 221 06/29/2014   No results found for this basename: ALT,  AST,  GGT,  ALKPHOS,  BILITOT   Lab Results  Component Value Date   INR 0.97 06/25/2014    Current radiology studies Ct Angio Chest Pe W/cm &/or Wo Cm  06/28/2014   CLINICAL DATA:  Followup PE evaluation.  Evaluate RV/LV ratio.  EXAM: CT ANGIOGRAPHY CHEST WITH CONTRAST  TECHNIQUE: Multidetector CT imaging of the chest was performed using the standard protocol during bolus administration of intravenous contrast. Multiplanar CT image reconstructions and MIPs were obtained to evaluate the vascular anatomy.  CONTRAST:  127mL OMNIPAQUE IOHEXOL 350 MG/ML SOLN  COMPARISON:  06/26/2014  FINDINGS: Heart: The RV/LV ratio is1.06. Previously this measured 1.28. No pericardial effusion.  Vascular structures: There is persistent clot within bilateral upper and lower lobe pulmonary arteries. There has been some improvement in the large saddle embolus, consistent with interval lysis. No new new significant clot identified.  Mediastinum/thyroid: No mediastinal, hilar, or axillary adenopathy. The visualized portion of the thyroid gland has a normal appearance.  Lungs: Left basilar density appears less discrete when compared with prior study and may represent atelectasis or infarct. Followup CT of the chest is recommended in 3 months. No new nodules, pleural effusions, or infiltrate identified.  Upper abdomen: Unremarkable.  Chest wall/osseous structures: Significant thoracic degenerative changes.  Review of the MIP images confirms the above findings.  IMPRESSION: 1. There has been some improvement in clot burden. Persistent bilateral upper and lower lobe pulmonary emboli persist. 2. Positive for acute PE with CT  evidence of right heartstrain (RV/LV Ratio = 1.06) consistent with at least submassive (intermediate risk)PE. The presence of right heart strain has been associated with an increased risk of morbidity and mortality. Consultation with Pulmonary and Critical Care Medicine is recommended.   Electronically Signed   By: Shon Hale M.D.   On: 06/28/2014 17:22    Disposition:  01-Home or Self Care     Medication List    STOP taking these medications       ibuprofen 200 MG tablet  Commonly known as:  ADVIL,MOTRIN      TAKE these medications       acetaminophen 500 MG tablet  Commonly known as:  TYLENOL  Take 500 mg by mouth every 6 (six) hours as needed for mild pain or moderate pain.     hydrochlorothiazide 12.5 MG capsule  Commonly known as:  MICROZIDE  Take 12.5 mg by mouth every morning.  Rivaroxaban 15 & 20 MG Tbpk  Commonly known as:  XARELTO STARTER PACK  Take as directed on package: Start with one 15mg  tablet by mouth twice a day with food. On Day 22, switch to one 20mg  tablet once a day with food.     rivaroxaban 20 MG Tabs tablet  Commonly known as:  XARELTO  Take 1 tablet (20 mg total) by mouth daily with supper.       Follow-up Information   Follow up with BUTLER, CYNTHIA, DO In 2 weeks.   Contact information:   [address] [place] [address] [phone]       Follow up with Rigoberto Noel., MD On 08/24/2014. (3pm )    Specialty:  Pulmonary Disease   Contact information:   [address] [place] [address] [phone]       Follow up with PARRETT,TAMMY, NP On 07/20/2014. (1030am )    Specialty:  Nurse Practitioner   Contact information:   [place] [place] [address] [phone]       Discharged Condition: good  Physician Statement:   The Patient was personally examined, the discharge assessment and plan has been personally reviewed and I agree with ACNP Babcock's assessment and plan. > 30 minutes of time have been dedicated to  discharge assessment, planning and discharge instructions.   SignedMarni Griffon 06/29/2014, 12:19 PM   Merton Border, MD ; [facility] [phone].  After 5:30 PM or weekends, call [phone]

## 2014-06-29 NOTE — Telephone Encounter (Signed)
xarelto needs prior authorization. Pharmacy will give 4 tablets on my on call verbal order but while patient is using these 2 day supply [place] pulm office needs to get prior autho complete. [facility] [place] [facility] [phone] to send info to [facility].   Triage, recommend you work on this as soon as you see this on 06/30/14  Dr. Brand Males, M.D., [facility].C.P Pulmonary and Critical Care Medicine Staff Physician [place] [facility] Pager: [phone], If no answer or between  15:00h - 7:00h: call [phone]  06/29/2014 8:12 PM

## 2014-06-29 NOTE — Progress Notes (Addendum)
ANTICOAGULATION CONSULT NOTE - Follow Up Consult  Pharmacy Consult:  Heparin Indication:  Bilateral PEs + LLE DVT  No Known Allergies  Patient Measurements: Height: 6' 3.5" (191.8 cm) Weight: 240 lb 8 oz (109.09 kg) IBW/kg (Calculated) : 85.65 Heparin Dosing Weight: 107 kg  Vital Signs: Temp: 98 F (36.7 C) (09/14 0557) Temp src: Oral (09/14 0557) BP: 137/52 mmHg (09/14 0557) Pulse Rate: 56 (09/14 0557)  Labs:  Recent Labs  06/27/14 0430 06/27/14 1010 06/27/14 1849 06/28/14 0630 06/29/14 0600  HGB 12.6*  --   --  12.6* 12.2*  HCT 38.7*  --   --  38.2* 37.0*  PLT 170  --   --  192 221  HEPARINUNFRC 0.56 0.70 0.55 0.60  --   CREATININE 0.96  --   --  1.01  --     Estimated Creatinine Clearance: 98.1 ml/min (by C-G formula based on Cr of 1.01).     Assessment: 74 YOM with new bilateral PEs s/p catheter directed thrombolysis on 06/26/14.  He is also positive for LLE DVT.  There has been noted of groin site bleed on 06/26/14 but this has been resolved.  Heparin level remains therapeutic.   Goal of Therapy:  Heparin level 0.3-0.7 units/ml Monitor platelets by anticoagulation protocol: Yes    Plan:  - Continue heparin gtt at 1250 units/hr - Daily HL / CBC - F/U hypercoagulable panel - If to transition to Xarelto, the recommended dosing is 15mg  PO BIDWM x 21 days, then 20mg  PO daily with supper      D. Mina Marble, PharmD, BCPS Pager:  [phone] - [phone] 06/29/2014, 10:18 AM    =============================================   Addendum: - transition to Xarelto   Plan: - Xarelto counseling provided to patient and family, verbalized understanding.     D. Mina Marble, PharmD, BCPS Pager:  [phone] 06/29/2014, 1:00 PM

## 2014-06-30 LAB — ANA: Anti Nuclear Antibody(ANA): NEGATIVE

## 2014-06-30 LAB — BETA-2-GLYCOPROTEIN I ABS, IGG/M/A
Beta-2 Glyco I IgG: 11 G Units (ref <20)
Beta-2-Glycoprotein I IgA: 3 A Units (ref <20)
Beta-2-Glycoprotein I IgM: 1 M Units (ref <20)

## 2014-06-30 LAB — CARDIOLIPIN ANTIBODIES, IGG, IGM, IGA
Anticardiolipin IgA: 8 APL U/mL — ABNORMAL LOW (ref <22)
Anticardiolipin IgG: 17 GPL U/mL (ref <23)
Anticardiolipin IgM: 0 MPL U/mL — ABNORMAL LOW (ref <11)

## 2014-06-30 MED ORDER — RIVAROXABAN 15 MG PO TABS: 15.0000 mg | ORAL_TABLET | Freq: Two times a day (BID) | ORAL | Status: DC

## 2014-06-30 NOTE — Telephone Encounter (Signed)
PA's obtained in triage - one for the [place] and the 20mg  rx Member ID [id number] Called [facility] and spoke with representative Phil. Attempted to do PA's over the phone - diagnosis code 415.19 used for the PE Per Phil, the [place] had to be sent for clinical review - we should know something in the next 24-72 hours - we will need to check on this daily The Xarelto 20mg  was approved over the phone until 9.15.16 > reference # [id number] PA done under RA as he saw pt in the hospital (did the H&P)  Called spoke with patient to inform him of the above.  Pt did pick up the 4 tabs that MR called in for him last evening - they cost him $55.  Fortunately, we have samples in the office.  2 boxes provided for pt - he will pick them up tomorrow (advised on office location, floor, etc).  Samples documented per protocol. Will leave in triage to ensure follow up on the [facility] prior authorization

## 2014-07-02 NOTE — Telephone Encounter (Signed)
Called [facility] at [phone] to check the status of the xarelto starter pack--- The 15 mg did not need PA. The test claim was run through and there was no problem with this.    I called the pharmacy and they stated that the insurance will cover the started pack but it will cost the pt $215. The xarelto 20 mg daily will cost the pt $45.  MR please advise what you would like to do.  thanks

## 2014-07-20 ENCOUNTER — Encounter: Payer: Self-pay | Admitting: Adult Health

## 2014-07-20 ENCOUNTER — Ambulatory Visit (INDEPENDENT_AMBULATORY_CARE_PROVIDER_SITE_OTHER): Payer: Medicare Other | Admitting: Adult Health

## 2014-07-20 VITALS — BP 140/70 | HR 67 | Temp 98.3°F | Ht 75.5 in | Wt 244.4 lb

## 2014-07-20 DIAGNOSIS — Z23 Encounter for immunization: Secondary | ICD-10-CM

## 2014-07-20 DIAGNOSIS — I2699 Other pulmonary embolism without acute cor pulmonale: Secondary | ICD-10-CM

## 2014-07-20 DIAGNOSIS — R911 Solitary pulmonary nodule: Secondary | ICD-10-CM | POA: Insufficient documentation

## 2014-07-20 MED ORDER — RIVAROXABAN 20 MG PO TABS: 20.0000 mg | ORAL_TABLET | Freq: Every day | ORAL | Status: DC

## 2014-07-20 NOTE — Assessment & Plan Note (Addendum)
Unprovoked PE and left DVT -no prior hx or FH  Hypercoagulable panel unrevealing except for elevated PTT Lupus anticogulant  May have to change to coumadin for more affordable option for anticoagulation  Papers filled out for Xarelto program.  Will refer to hematology in future as may need lifelong therapy (wait until after CT chest done in 3 months )   Plan  Continue on Xarelto 15mg  Twice daily  Today then begin 20mg   Tomorrow.   Call if any bleeding develops immediately  Remember no aleve, advil, motrin etc. As they cause increased bleeding.  Follow up Dr. Elsworth Soho  In 1 month as planned and As needed   Please contact office for sooner follow up if symptoms do not improve or worsen or seek emergency care

## 2014-07-20 NOTE — Progress Notes (Signed)
Subjective:    Patient ID: George Matthews, male    DOB: 28-Jan-1951, 63 y.o.   MRN: [id number]  HPI 63 yo male admitted 06/25/14 by PCCM for unprovoked PE/DVT with right sided heart strain requiring cath directed pulmonary artery thrombolysis .  Rx Xarelto at discharge   07/20/2014 Follow up  Pt was admitted September 10- September 14 for significant shortness, of breath and hypoxemia. On arrival to the emergency room. Patient underwent a CT chest that showed bilateral PE with moderate large clot burden and possible right heart strain. Patient did undergo catheter directed pulmonary artery thrombolysis  Venous Doppler showed a mobile clot in the left lower extremity. Patient did undergo an IVC filter. He has been set up with interventional radiology for followup next week with plans for probable removal in 3-6 months. Patient was transitioned to Xarelto prior to discharge.  Patient does have a history of remote smoking, CT chest did show a 1.5 cm subpleural nodule at left lung base. PE. Was felt to be unprovoked, with no recent history of travel surgery, injury or immobility. Patient has no history of clot nor a family history of clots. Hypercoagulable panel was unrevealing, except for PTT Lupus Anticoauglant was + high 121.9  Lupus Anticogulant was not detected.  Pt says since discharge he is feeling much better . Dyspnea has resolved . Is returning to normal activitites No known bleeding.  Is having trouble affording Xarelto as he is in doughnut hole/gap coverage  for his insurance . He is depending on samples right now. We filled out pt assistance program papers today .  Has PCP and says colonoscopy is up to date.  No known prostate issues.  Has minimal left leg swelling  No chest pain, orthopnea, edema or hemoptysis, unintentional weight loss.       Review of Systems Constitutional:   No  weight loss, night sweats,  Fevers, chills, fatigue, or  lassitude.  HEENT:   No headaches,   Difficulty swallowing,  Tooth/dental problems, or  Sore throat,                No sneezing, itching, ear ache, nasal congestion, post nasal drip,   CV:  No chest pain,  Orthopnea, PND, swelling in lower extremities, anasarca, dizziness, palpitations, syncope.   GI  No heartburn, indigestion, abdominal pain, nausea, vomiting, diarrhea, change in bowel habits, loss of appetite, bloody stools.   Resp: No shortness of breath with exertion or at rest.  No excess mucus, no productive cough,  No non-productive cough,  No coughing up of blood.  No change in color of mucus.  No wheezing.  No chest wall deformity  Skin: no rash or lesions.  GU: no dysuria, change in color of urine, no urgency or frequency.  No flank pain, no hematuria   MS:  No joint pain or swelling.  No decreased range of motion.  No back pain.  Psych:  No change in mood or affect. No depression or anxiety.  No memory loss.         Objective:   Physical Exam GEN: A/Ox3; pleasant , NAD, elderly , obese   HEENT:  [place]/AT,  EACs-clear, TMs-wnl, NOSE-clear, THROAT-clear, no lesions, no postnasal drip or exudate noted.   NECK:  Supple w/ fair ROM; no JVD; normal carotid impulses w/o bruits; no thyromegaly or nodules palpated; no lymphadenopathy.  RESP  Clear  P & A; w/o, wheezes/ rales/ or rhonchi.no accessory muscle use, no dullness to percussion  CARD:  RRR, no m/r/g  , tr to none  peripheral edema, pulses intact, no cyanosis or clubbing.  GI:   Soft & nt; nml bowel sounds; no organomegaly or masses detected.  Musco: Warm bil, no deformities or joint swelling noted.   Neuro: alert, no focal deficits noted.    Skin: Warm, no lesions or rashes         Assessment & Plan:

## 2014-07-20 NOTE — Assessment & Plan Note (Signed)
-  Repeat CT in 3 months

## 2014-07-20 NOTE — Patient Instructions (Addendum)
We are setting you up for a CT chest to follow lung nodule in 3 months -in December.  Continue on Xarelto 15mg  Twice daily  Today then begin 20mg   Tomorrow.  I will be in touch if insurance will cover alternate for Xarelto or we may have to change to Coumadin  I will call in next couple of days.  Call if any bleeding develops immediately  Remember no aleve, advil, motrin etc. As they cause increased bleeding.  Follow up Dr. Elsworth Soho  In 1 month as planned and As needed   Please contact office for sooner follow up if symptoms do not improve or worsen or seek emergency care    Late add :: pt PCP got 1 month sample pack to last pt till pt assistance program makes a decision  Advised to follow up in office as planned , will discuss further on return

## 2014-07-22 ENCOUNTER — Telehealth: Payer: Self-pay | Admitting: Adult Health

## 2014-07-22 NOTE — Telephone Encounter (Signed)
Pt seen 10.5.15 by TP for HFU for PE - started on Xarelto inpatient Patient Assistance forms given to pt at the office d/t cost of medication from being in the 'donut hole'  Called spoke with spouse She had some questions concerning documenting their insurance information and believes she may have done this incorrectly.  Discussed with spouse and additional copy of that 1 page has been printed and placed in the mail to pt/spouse.  Verified with spouse that pt does have enough Xarelto to see him through this process > pt received samples from PCP.  Nothing further needed; will sign off.

## 2014-07-24 NOTE — Progress Notes (Signed)
Reviewed & agree with plan  

## 2014-08-24 ENCOUNTER — Encounter: Payer: Self-pay | Admitting: Pulmonary Disease

## 2014-08-24 ENCOUNTER — Ambulatory Visit (INDEPENDENT_AMBULATORY_CARE_PROVIDER_SITE_OTHER): Payer: Medicare Other | Admitting: Pulmonary Disease

## 2014-08-24 DIAGNOSIS — I2699 Other pulmonary embolism without acute cor pulmonale: Secondary | ICD-10-CM

## 2014-08-24 DIAGNOSIS — R911 Solitary pulmonary nodule: Secondary | ICD-10-CM

## 2014-08-24 NOTE — Patient Instructions (Signed)
DOppler test of legs to check for blood clot - If gone, will plan for removal of filter Repeat CT scan of lungs in dec for nodule in left lung Call if xarelto becomes too expensive & we can write rx for coumadin

## 2014-08-24 NOTE — Progress Notes (Signed)
   Subjective:    Patient ID: George Matthews, male    DOB: 1951-02-09, 63 y.o.   MRN: [id number]  HPI  63 yo male admitted 06/25/14 for unprovoked PE/DVT with right sided heart strain requiring cath directed pulmonary artery thrombolysis .  Rx Xarelto at discharge  Venous Doppler showed a mobile clot in the left lower extremity. Patient does have a history of remote smoking, CT chest did show a 1.5 cm subpleural nodule at left lung base. PE. Was felt to be unprovoked, with no recent history of travel surgery, injury or immobility. Patient has no history of clot nor a family history of clots. Hypercoagulable panel was unrevealing, except for PTT Lupus Anticoauglant was + high 121.9   08/24/2014  Chief Complaint  Patient presents with  . Hospitalization Follow-up    blood clots in lungs; no complaints; wants samples of Xarelto   Breathing much better Is having trouble affording Xarelto as he is in doughnut hole/gap coverage for his insurance .   - colonoscopy is up to date.  Has minimal left leg swelling  No chest pain, orthopnea, edema or hemoptysis, unintentional weight loss.   Review of Systems neg for any significant sore throat, dysphagia, itching, sneezing, nasal congestion or excess/ purulent secretions, fever, chills, sweats, unintended wt loss, pleuritic or exertional cp, hempoptysis, orthopnea pnd or change in chronic leg swelling. Also denies presyncope, palpitations, heartburn, abdominal pain, nausea, vomiting, diarrhea or change in bowel or urinary habits, dysuria,hematuria, rash, arthralgias, visual complaints, headache, numbness weakness or ataxia.     Objective:   Physical Exam  Gen. Pleasant, well-nourished, in no distress ENT - no lesions, no post nasal drip Neck: No JVD, no thyromegaly, no carotid bruits Lungs: no use of accessory muscles, no dullness to percussion, clear without rales or rhonchi  Cardiovascular: Rhythm regular, heart sounds  normal, no murmurs or  gallops, no peripheral edema Musculoskeletal: No deformities, no cyanosis or clubbing         Assessment & Plan:

## 2014-08-24 NOTE — Assessment & Plan Note (Signed)
DOppler test of legs to check for blood clot - If gone, will plan for removal of filter Call if xarelto becomes too expensive & we can write rx for coumadin

## 2014-08-24 NOTE — Assessment & Plan Note (Signed)
35m FU CT scan of lungs in dec for nodule in left lung

## 2014-08-25 ENCOUNTER — Ambulatory Visit (HOSPITAL_COMMUNITY): Payer: Medicare Other | Attending: Cardiology | Admitting: Cardiology

## 2014-08-25 DIAGNOSIS — I82402 Acute embolism and thrombosis of unspecified deep veins of left lower extremity: Secondary | ICD-10-CM | POA: Insufficient documentation

## 2014-08-25 DIAGNOSIS — Z87891 Personal history of nicotine dependence: Secondary | ICD-10-CM | POA: Diagnosis not present

## 2014-08-25 DIAGNOSIS — I2699 Other pulmonary embolism without acute cor pulmonale: Secondary | ICD-10-CM

## 2014-08-25 DIAGNOSIS — I80222 Phlebitis and thrombophlebitis of left popliteal vein: Secondary | ICD-10-CM

## 2014-08-25 NOTE — Progress Notes (Signed)
Left lower extremity venous duplex performed  

## 2014-08-26 ENCOUNTER — Other Ambulatory Visit: Payer: Self-pay | Admitting: Pulmonary Disease

## 2014-08-26 DIAGNOSIS — I2699 Other pulmonary embolism without acute cor pulmonale: Secondary | ICD-10-CM

## 2014-08-31 ENCOUNTER — Other Ambulatory Visit: Payer: Self-pay | Admitting: Radiology

## 2014-09-01 ENCOUNTER — Ambulatory Visit (HOSPITAL_COMMUNITY)
Admission: RE | Admit: 2014-09-01 | Discharge: 2014-09-01 | Disposition: A | Discharge status: Home / Self Care | Payer: Medicare Other | Source: Ambulatory Visit | Attending: Pulmonary Disease | Admitting: Pulmonary Disease

## 2014-09-01 ENCOUNTER — Encounter (HOSPITAL_COMMUNITY): Payer: Self-pay

## 2014-09-01 DIAGNOSIS — Z452 Encounter for adjustment and management of vascular access device: Secondary | ICD-10-CM | POA: Insufficient documentation

## 2014-09-01 DIAGNOSIS — Z87891 Personal history of nicotine dependence: Secondary | ICD-10-CM | POA: Insufficient documentation

## 2014-09-01 DIAGNOSIS — Z86711 Personal history of pulmonary embolism: Secondary | ICD-10-CM | POA: Insufficient documentation

## 2014-09-01 DIAGNOSIS — I10 Essential (primary) hypertension: Secondary | ICD-10-CM | POA: Insufficient documentation

## 2014-09-01 DIAGNOSIS — Z8673 Personal history of transient ischemic attack (TIA), and cerebral infarction without residual deficits: Secondary | ICD-10-CM | POA: Insufficient documentation

## 2014-09-01 DIAGNOSIS — I2699 Other pulmonary embolism without acute cor pulmonale: Secondary | ICD-10-CM

## 2014-09-01 DIAGNOSIS — M1990 Unspecified osteoarthritis, unspecified site: Secondary | ICD-10-CM | POA: Insufficient documentation

## 2014-09-01 LAB — BASIC METABOLIC PANEL
Anion gap: 13 (ref 5–15)
BUN: 15 mg/dL (ref 6–23)
CO2: 26 mEq/L (ref 19–32)
Calcium: 9.4 mg/dL (ref 8.4–10.5)
Chloride: 101 mEq/L (ref 96–112)
Creatinine, Ser: 0.99 mg/dL (ref 0.50–1.35)
GFR calc Af Amer: >90 mL/min (ref >90)
GFR calc non Af Amer: 84 mL/min — ABNORMAL LOW (ref >90)
Glucose, Bld: 76 mg/dL (ref 70–99)
Potassium: 3.9 mEq/L (ref 3.7–5.3)
Sodium: 140 mEq/L (ref 137–147)

## 2014-09-01 LAB — CBC
HCT: 39.9 % (ref 39.0–52.0)
Hemoglobin: 12.7 g/dL — ABNORMAL LOW (ref 13.0–17.0)
MCH: 27.4 pg (ref 26.0–34.0)
MCHC: 31.8 g/dL (ref 30.0–36.0)
MCV: 86.2 fL (ref 78.0–100.0)
Platelets: 292 10*3/uL (ref 150–400)
RBC: 4.63 MIL/uL (ref 4.22–5.81)
RDW: 15.7 % — ABNORMAL HIGH (ref 11.5–15.5)
WBC: 4.9 10*3/uL (ref 4.0–10.5)

## 2014-09-01 LAB — PROTIME-INR
INR: 1.23 (ref 0.00–1.49)
Prothrombin Time: 15.6 seconds — ABNORMAL HIGH (ref 11.6–15.2)

## 2014-09-01 MED ORDER — MIDAZOLAM HCL 2 MG/2ML IJ SOLN
INTRAMUSCULAR | Status: AC
  Filled 2014-09-01: qty 6

## 2014-09-01 MED ORDER — MIDAZOLAM HCL 2 MG/2ML IJ SOLN
INTRAMUSCULAR | Status: AC | PRN
  Administered 2014-09-01: 1 mg via INTRAVENOUS

## 2014-09-01 MED ORDER — LIDOCAINE HCL 1 % IJ SOLN
INTRAMUSCULAR | Status: AC
  Filled 2014-09-01: qty 20

## 2014-09-01 MED ORDER — FENTANYL CITRATE 0.05 MG/ML IJ SOLN
INTRAMUSCULAR | Status: AC
  Filled 2014-09-01: qty 6

## 2014-09-01 MED ORDER — SODIUM CHLORIDE 0.9 % IV SOLN
INTRAVENOUS | Status: DC
  Administered 2014-09-01: 14:00:00 via INTRAVENOUS

## 2014-09-01 MED ORDER — IOHEXOL 300 MG/ML  SOLN
50.0000 mL | Freq: Once | INTRAMUSCULAR | Status: AC | PRN
  Administered 2014-09-01: 40 mL via INTRAVENOUS

## 2014-09-01 MED ORDER — FENTANYL CITRATE 0.05 MG/ML IJ SOLN
INTRAMUSCULAR | Status: AC | PRN
  Administered 2014-09-01: 50 ug via INTRAVENOUS

## 2014-09-01 NOTE — Procedures (Signed)
Procedure:  IVC filter removal Access:  Right IJ vein, 11 Fr sheath Findings:  Venography shows normally patent IVC and filter with no captured thrombus in filter. IVC filter removed in entirety. No complications on post removal venography.

## 2014-09-01 NOTE — H&P (Signed)
Chief Complaint: "I'm getting my filter out"  Referring Physician(s): Alva,Rakesh V  History of Present Illness: George Matthews is a 63 y.o. male with history of PE/LLE DVT 06/2014 with right sided heart strain requiring cath directed pulmonary artery thrombolysis and IVC filter placement. He has been treated with xarelto as OP and recent LE venous doppler reveals resolution of clot. He presents today for IVC filter removal.   Past Medical History  Diagnosis Date  . DJD (degenerative joint disease)   . Somatic dysfunction   . HTN (hypertension)   . ED (erectile dysfunction)   . Tubular adenoma of colon 2002  . Stroke     affected vision and speech mildly  . Polio     right leg/arm weakness    Past Surgical History  Procedure Laterality Date  . Colonoscopy  2002    Dr. Rehman--> Single diverticulum at the splenic flexure, tiny polyp at transverse colon, tubular adenomatous polyp.  . Colonoscopy  08/23/2012    Procedure: COLONOSCOPY;  Surgeon: Danie Binder, MD;  Location: [facility];  Service: Endoscopy;  Laterality: N/A;  10:15    Allergies: Review of patient's allergies indicates no known allergies.  Medications: Prior to Admission medications   Medication Sig Start Date End Date Taking? Authorizing Provider  acetaminophen (TYLENOL) 500 MG tablet Take 500 mg by mouth every 6 (six) hours as needed for mild pain or moderate pain.   Yes Historical Provider, MD  allopurinol (ZYLOPRIM) 100 MG tablet Take 100 mg by mouth 3 (three) times daily.    Yes Historical Provider, MD  atorvastatin (LIPITOR) 20 MG tablet Take 20 mg by mouth every morning.    Yes Historical Provider, MD  rivaroxaban (XARELTO) 20 MG TABS tablet Take 1 tablet (20 mg total) by mouth daily with supper. 07/20/14  Yes Tammy S Parrett, NP  telmisartan-hydrochlorothiazide (MICARDIS HCT) 80-12.5 MG per tablet Take 1 tablet by mouth every morning.    Yes Historical Provider, MD  ergocalciferol (VITAMIN D2) 50000  UNITS capsule Take 50,000 Units by mouth 2 (two) times a week.    Historical Provider, MD    Family History  Problem Relation Age of Onset  . Colon cancer Father     age 39  . Heart attack Father     deceased age 41  . Liver disease Neg Hx     History   Social History  . Marital Status: Married    Spouse Name: N/A    Number of Children: 2  . Years of Education: N/A   Occupational History  . 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 Smoking status: Former Smoker    Quit date: 06/25/1972  . Smokeless tobacco: None  . Alcohol Use: No  . Drug Use: No  . Sexual Activity: None   Other Topics Concern  . None   Social History Narrative         Review of Systems  Constitutional: Negative for fever and chills.  Respiratory: Negative for cough and shortness of breath.   Cardiovascular: Negative for chest pain.  Gastrointestinal: Negative for nausea, vomiting, abdominal pain and blood in stool.  Genitourinary: Negative for dysuria and hematuria.  Musculoskeletal: Negative for back pain.  Neurological: Negative for headaches.  Hematological: Does not bruise/bleed easily.    Vital Signs: BP 145/70 mmHg  Pulse 63  Temp(Src) 98.3 F (36.8 C) (Oral)  Resp 18  Ht 6' 3.5" (1.918 m)  Wt  244 lb (110.678 kg)  BMI 30.09 kg/m2  SpO2 99%  Physical Exam  Constitutional: He is oriented to person, place, and time. He appears well-developed and well-nourished.  Cardiovascular: Normal rate and regular rhythm.   Pulmonary/Chest: Effort normal and breath sounds normal.  Abdominal: Soft. Bowel sounds are normal. There is no tenderness.  Musculoskeletal: Normal range of motion.  Trace LLE edema  Neurological: He is alert and oriented to person, place, and time.    Imaging: No results found.  Labs:  CBC:  Recent Labs  06/27/14 0430 06/28/14 0630 06/29/14 0600 09/01/14 1355  WBC 6.0 5.7 6.1 4.9  HGB 12.6* 12.6* 12.2* 12.7*  HCT 38.7* 38.2* 37.0*  39.9  PLT 170 192 221 292    COAGS:  Recent Labs  06/25/14 2251  INR 0.97    BMP:  Recent Labs  06/25/14 1937 06/26/14 0229 06/27/14 0430 06/28/14 0630  NA 140 139 139 138  K 3.8 4.1 4.4 4.0  CL 101 100 108 104  CO2 26 25 19 22   GLUCOSE 93 109* 81 103*  BUN 16 14 12 11   CALCIUM 9.2 9.1 8.3* 8.7  CREATININE 1.11 1.08 0.96 1.01  GFRNONAA 68* 70* 85* 76*  GFRAA 79* 81* >90 88*    LIVER FUNCTION TESTS: No results for input(s): BILITOT, AST, ALT, ALKPHOS, PROT, ALBUMIN in the last 8760 hours.  TUMOR MARKERS: No results for input(s): AFPTM, CEA, CA199, CHROMGRNA in the last 8760 hours.  Assessment and Plan: [name] is a 63 y.o. male with history of PE/LLE DVT 06/2014 with right sided heart strain requiring cath directed pulmonary artery thrombolysis and IVC filter placement. He has been treated with xarelto as OP and recent LE venous doppler reveals resolution of clot. He presents today for IVC filter removal. Details/risks of procedure d/w pt/wife with their understanding and consent.        Signed: Autumn Messing 09/01/2014, 2:22 PM

## 2014-09-01 NOTE — Discharge Instructions (Signed)
Conscious Sedation, Adult, Care After Refer to this sheet in the next few weeks. These instructions provide you with information on caring for yourself after your procedure. Your health care provider may also give you more specific instructions. Your treatment has been planned according to current medical practices, but problems sometimes occur. Call your health care provider if you have any problems or questions after your procedure. WHAT TO EXPECT AFTER THE PROCEDURE  After your procedure:  You may feel sleepy, clumsy, and have poor balance for several hours.  Vomiting may occur if you eat too soon after the procedure. HOME CARE INSTRUCTIONS  Do not participate in any activities where you could become injured for at least 24 hours. Do not:  Drive.  Swim.  Ride a bicycle.  Operate heavy machinery.  Cook.  Use power tools.  Climb ladders.  Work from a high place.  Do not make important decisions or sign legal documents until you are improved.  If you vomit, drink water, juice, or soup when you can drink without vomiting. Make sure you have little or no nausea before eating solid foods.  Only take over-the-counter or prescription medicines for pain, discomfort, or fever as directed by your health care provider.  Make sure you and your family fully understand everything about the medicines given to you, including what side effects may occur.  You should not drink alcohol, take sleeping pills, or take medicines that cause drowsiness for at least 24 hours.  If you smoke, do not smoke without supervision.  If you are feeling better, you may resume normal activities 24 hours after you were sedated.  Keep all appointments with your health care provider. SEEK MEDICAL CARE IF:  Your skin is pale or bluish in color.  You continue to feel nauseous or vomit.  Your pain is getting worse and is not helped by medicine.  You have bleeding or swelling.  You are still sleepy or  feeling clumsy after 24 hours. SEEK IMMEDIATE MEDICAL CARE IF:  You develop a rash.  You have difficulty breathing.  You develop any type of allergic problem.  You have a fever. MAKE SURE YOU:  Understand these instructions.  Will watch your condition.  Will get help right away if you are not doing well or get worse. Document Released: 07/23/2013 Document Reviewed: 07/23/2013 [facility] Patient Information 2015 [place], [place]. This information is not intended to replace advice given to you by your health care provider. Make sure you discuss any questions you have with your health care provider. Remove Dressing after 24 hours, call with any problems.

## 2014-09-02 ENCOUNTER — Telehealth: Payer: Self-pay | Admitting: Pulmonary Disease

## 2014-09-02 NOTE — Telephone Encounter (Signed)
Spoke with pt. Advised pt that we do not have any Xarelto samples but to contact his PCP and see if they are able to give him any. Pt denied needing any prescription. Pt verbalized understanding and denied any further questions or concerns at this time.

## 2014-09-21 ENCOUNTER — Other Ambulatory Visit (INDEPENDENT_AMBULATORY_CARE_PROVIDER_SITE_OTHER): Payer: Medicare Other

## 2014-09-21 DIAGNOSIS — I2699 Other pulmonary embolism without acute cor pulmonale: Secondary | ICD-10-CM

## 2014-09-21 LAB — BASIC METABOLIC PANEL
BUN: 12 mg/dL (ref 6–23)
CO2: 28 mEq/L (ref 19–32)
Calcium: 8.8 mg/dL (ref 8.4–10.5)
Chloride: 104 mEq/L (ref 96–112)
Creatinine, Ser: 1.1 mg/dL (ref 0.4–1.5)
GFR: 90.91 mL/min (ref >60.00)
Glucose, Bld: 95 mg/dL (ref 70–99)
Potassium: 3.6 mEq/L (ref 3.5–5.1)
Sodium: 137 mEq/L (ref 135–145)

## 2014-09-28 ENCOUNTER — Ambulatory Visit (INDEPENDENT_AMBULATORY_CARE_PROVIDER_SITE_OTHER)
Admission: RE | Admit: 2014-09-28 | Discharge: 2014-09-28 | Disposition: A | Discharge status: Home / Self Care | Payer: Medicare Other | Source: Ambulatory Visit | Attending: Pulmonary Disease | Admitting: Pulmonary Disease

## 2014-09-28 ENCOUNTER — Other Ambulatory Visit: Payer: Medicare Other

## 2014-09-28 DIAGNOSIS — R911 Solitary pulmonary nodule: Secondary | ICD-10-CM

## 2014-09-28 MED ORDER — IOHEXOL 300 MG/ML  SOLN
80.0000 mL | Freq: Once | INTRAMUSCULAR | Status: AC | PRN
  Administered 2014-09-28: 80 mL via INTRAVENOUS

## 2014-09-30 ENCOUNTER — Encounter: Payer: Self-pay | Admitting: Pulmonary Disease

## 2014-09-30 ENCOUNTER — Ambulatory Visit (INDEPENDENT_AMBULATORY_CARE_PROVIDER_SITE_OTHER): Payer: Medicare Other | Admitting: Pulmonary Disease

## 2014-09-30 VITALS — BP 144/80 | HR 94

## 2014-09-30 DIAGNOSIS — I2699 Other pulmonary embolism without acute cor pulmonale: Secondary | ICD-10-CM

## 2014-09-30 DIAGNOSIS — Z23 Encounter for immunization: Secondary | ICD-10-CM

## 2014-09-30 DIAGNOSIS — R911 Solitary pulmonary nodule: Secondary | ICD-10-CM

## 2014-09-30 NOTE — Assessment & Plan Note (Signed)
decreased to 38mm on FU CT Likely benign ? Related to PE Rpt CT in 6-12 months

## 2014-09-30 NOTE — Progress Notes (Signed)
   Subjective:    Patient ID: George Matthews, male    DOB: 1951/09/03, 63 y.o.   MRN: [id number]  HPI  63 yo male for FU of DVT/PE admitted 06/25/14 for unprovoked PE/DVT with right sided heart strain requiring cath directed pulmonary artery thrombolysis .   Venous Doppler showed a mobile clot in the left lower extremity. Patient does have a history of remote smoking, CT chest did show a 1.5 cm subpleural nodule at left lung base. Hypercoagulable panel >>except for PTT Lupus Anticoauglant was + high 121.9   09/30/2014  Chief Complaint  Patient presents with  . Follow-up    f/u Pulmonary Embolism, sinus congestion, discuss what medications he can take with Xarelto.     Leg clot has resolved on fu duplex, IVC filter removed by IR CT chest 09/2014 - Interval regression of a nodular density in the subpleural left lower lobe Breathing much better  - colonoscopy is up to date,states PSA was done  Has minimal left leg swelling  No chest pain, orthopnea, edema or hemoptysis, unintentional weight loss.     Review of Systems neg for any significant sore throat, dysphagia, itching, sneezing, nasal congestion or excess/ purulent secretions, fever, chills, sweats, unintended wt loss, pleuritic or exertional cp, hempoptysis, orthopnea pnd or change in chronic leg swelling. Also denies presyncope, palpitations, heartburn, abdominal pain, nausea, vomiting, diarrhea or change in bowel or urinary habits, dysuria,hematuria, rash, arthralgias, visual complaints, headache, numbness weakness or ataxia.     Objective:   Physical Exam  Gen. Pleasant, well-nourished, in no distress ENT - no lesions, no post nasal drip Neck: No JVD, no thyromegaly, no carotid bruits Lungs: no use of accessory muscles, no dullness to percussion, clear without rales or rhonchi  Cardiovascular: Rhythm regular, heart sounds  normal, no murmurs or gallops, no peripheral edema Musculoskeletal: No deformities, no cyanosis or  clubbing         Assessment & Plan:

## 2014-09-30 NOTE — Assessment & Plan Note (Signed)
Ct xarelto -prob lifelong since positive lupus anticoagulant If unable to afford, may have to go to coumadin with goal INR higher 2.5 -3.0 range He has recovered well

## 2014-09-30 NOTE — Patient Instructions (Signed)
Nodule has decreased in size - so likely benign Stay on blood thinner Prevnar

## 2015-04-23 ENCOUNTER — Encounter (HOSPITAL_COMMUNITY): Payer: Self-pay | Admitting: Emergency Medicine

## 2015-04-23 ENCOUNTER — Emergency Department (HOSPITAL_COMMUNITY)
Admission: EM | Admit: 2015-04-23 | Discharge: 2015-04-23 | Disposition: A | Discharge status: Home / Self Care | Payer: Medicare Other | Attending: Emergency Medicine | Admitting: Emergency Medicine

## 2015-04-23 ENCOUNTER — Emergency Department (HOSPITAL_COMMUNITY): Payer: Medicare Other

## 2015-04-23 DIAGNOSIS — Z8619 Personal history of other infectious and parasitic diseases: Secondary | ICD-10-CM | POA: Diagnosis not present

## 2015-04-23 DIAGNOSIS — N410 Acute prostatitis: Secondary | ICD-10-CM | POA: Diagnosis not present

## 2015-04-23 DIAGNOSIS — Z8739 Personal history of other diseases of the musculoskeletal system and connective tissue: Secondary | ICD-10-CM | POA: Insufficient documentation

## 2015-04-23 DIAGNOSIS — I10 Essential (primary) hypertension: Secondary | ICD-10-CM | POA: Diagnosis not present

## 2015-04-23 DIAGNOSIS — Z791 Long term (current) use of non-steroidal anti-inflammatories (NSAID): Secondary | ICD-10-CM | POA: Diagnosis not present

## 2015-04-23 DIAGNOSIS — Z79899 Other long term (current) drug therapy: Secondary | ICD-10-CM | POA: Insufficient documentation

## 2015-04-23 DIAGNOSIS — Z86018 Personal history of other benign neoplasm: Secondary | ICD-10-CM | POA: Diagnosis not present

## 2015-04-23 DIAGNOSIS — R509 Fever, unspecified: Secondary | ICD-10-CM | POA: Diagnosis present

## 2015-04-23 DIAGNOSIS — Z87891 Personal history of nicotine dependence: Secondary | ICD-10-CM | POA: Insufficient documentation

## 2015-04-23 DIAGNOSIS — R42 Dizziness and giddiness: Secondary | ICD-10-CM | POA: Diagnosis not present

## 2015-04-23 DIAGNOSIS — Z8673 Personal history of transient ischemic attack (TIA), and cerebral infarction without residual deficits: Secondary | ICD-10-CM | POA: Diagnosis not present

## 2015-04-23 DIAGNOSIS — R112 Nausea with vomiting, unspecified: Secondary | ICD-10-CM | POA: Diagnosis not present

## 2015-04-23 LAB — BASIC METABOLIC PANEL
Anion gap: 9 (ref 5–15)
BUN: 19 mg/dL (ref 6–20)
CO2: 25 mmol/L (ref 22–32)
Calcium: 7.9 mg/dL — ABNORMAL LOW (ref 8.9–10.3)
Chloride: 98 mmol/L — ABNORMAL LOW (ref 101–111)
Creatinine, Ser: 1.24 mg/dL (ref 0.61–1.24)
GFR calc Af Amer: >60 mL/min (ref >60)
GFR calc non Af Amer: 59 mL/min — ABNORMAL LOW (ref >60)
Glucose, Bld: 115 mg/dL — ABNORMAL HIGH (ref 65–99)
Potassium: 2.9 mmol/L — ABNORMAL LOW (ref 3.5–5.1)
Sodium: 132 mmol/L — ABNORMAL LOW (ref 135–145)

## 2015-04-23 LAB — URINE MICROSCOPIC-ADD ON

## 2015-04-23 LAB — CBC WITH DIFFERENTIAL/PLATELET
Basophils Absolute: 0 10*3/uL (ref 0.0–0.1)
Basophils Relative: 0 % (ref 0–1)
Eosinophils Absolute: 0 10*3/uL (ref 0.0–0.7)
Eosinophils Relative: 0 % (ref 0–5)
HCT: 37.4 % — ABNORMAL LOW (ref 39.0–52.0)
Hemoglobin: 12.9 g/dL — ABNORMAL LOW (ref 13.0–17.0)
Lymphocytes Relative: 6 % — ABNORMAL LOW (ref 12–46)
Lymphs Abs: 0.6 10*3/uL — ABNORMAL LOW (ref 0.7–4.0)
MCH: 29.1 pg (ref 26.0–34.0)
MCHC: 34.5 g/dL (ref 30.0–36.0)
MCV: 84.4 fL (ref 78.0–100.0)
Monocytes Absolute: 0.9 10*3/uL (ref 0.1–1.0)
Monocytes Relative: 9 % (ref 3–12)
Neutro Abs: 9 10*3/uL — ABNORMAL HIGH (ref 1.7–7.7)
Neutrophils Relative %: 85 % — ABNORMAL HIGH (ref 43–77)
Platelets: 206 10*3/uL (ref 150–400)
RBC: 4.43 MIL/uL (ref 4.22–5.81)
RDW: 15.1 % (ref 11.5–15.5)
WBC: 10.5 10*3/uL (ref 4.0–10.5)

## 2015-04-23 LAB — URINALYSIS, ROUTINE W REFLEX MICROSCOPIC
Bilirubin Urine: NEGATIVE
Glucose, UA: NEGATIVE mg/dL
Ketones, ur: NEGATIVE mg/dL
Leukocytes, UA: NEGATIVE
Nitrite: NEGATIVE
Protein, ur: NEGATIVE mg/dL
Specific Gravity, Urine: <1.005 — ABNORMAL LOW (ref 1.005–1.030)
Urobilinogen, UA: 0.2 mg/dL (ref 0.0–1.0)
pH: 5.5 (ref 5.0–8.0)

## 2015-04-23 LAB — LACTIC ACID, PLASMA
Lactic Acid, Venous: 1.3 mmol/L (ref 0.5–2.0)
Lactic Acid, Venous: 1.1 mmol/L (ref 0.5–2.0)

## 2015-04-23 MED ORDER — SULFAMETHOXAZOLE-TRIMETHOPRIM 800-160 MG PO TABS
2.0000 | ORAL_TABLET | Freq: Once | ORAL | Status: AC
  Administered 2015-04-23: 2 via ORAL
  Filled 2015-04-23: qty 2

## 2015-04-23 MED ORDER — SODIUM CHLORIDE 0.9 % IV BOLUS (SEPSIS)
1000.0000 mL | Freq: Once | INTRAVENOUS | Status: AC
  Administered 2015-04-23: 1000 mL via INTRAVENOUS

## 2015-04-23 MED ORDER — POTASSIUM CHLORIDE CRYS ER 20 MEQ PO TBCR
60.0000 meq | EXTENDED_RELEASE_TABLET | Freq: Once | ORAL | Status: AC
  Administered 2015-04-23: 60 meq via ORAL
  Filled 2015-04-23: qty 3

## 2015-04-23 MED ORDER — ONDANSETRON HCL 4 MG/2ML IJ SOLN
4.0000 mg | Freq: Once | INTRAMUSCULAR | Status: AC
  Administered 2015-04-23: 4 mg via INTRAVENOUS
  Filled 2015-04-23: qty 2

## 2015-04-23 MED ORDER — SULFAMETHOXAZOLE-TRIMETHOPRIM 800-160 MG PO TABS: 1.0000 | ORAL_TABLET | Freq: Two times a day (BID) | ORAL | Status: AC

## 2015-04-23 NOTE — ED Notes (Signed)
Fever dizziness, chills and shaking x 1 week.

## 2015-04-23 NOTE — Discharge Instructions (Signed)
Fever, Adult °A fever is a temperature of 100.4° F (38° C) or above.  °HOME CARE °· Take fever medicine as told by your doctor. Do not  take aspirin for fever if you are younger than 66 years of age. °· If you are given antibiotic medicine, take it as told. Finish the medicine even if you start to feel better. °· Rest. °· Drink enough fluids to keep your pee (urine) clear or pale yellow. Do not drink alcohol. °· Take a bath or shower with room temperature water. Do not use ice water or alcohol sponge baths. °· Wear lightweight, loose clothes. °GET HELP RIGHT AWAY IF:  °· You are short of breath or have trouble breathing. °· You are very weak. °· You are dizzy or you pass out (faint). °· You are very thirsty or are making little or no urine. °· You have new pain. °· You throw up (vomit) or have watery poop (diarrhea). °· You keep throwing up or having watery poop for more than 1 to 2 days. °· You have a stiff neck or light bothers your eyes. °· You have a skin rash. °· You have a fever or problems (symptoms) that last for more than 2 to 3 days. °· You have a fever and your problems quickly get worse. °· You keep throwing up the fluids you drink. °· You do not feel better after 3 days. °· You have new problems. °MAKE SURE YOU:  °· Understand these instructions. °· Will watch your condition. °· Will get help right away if you are not doing well or get worse. °Document Released: 07/11/2008 Document Revised: 12/25/2011 Document Reviewed: 08/03/2011 °ExitCare® Patient Information ©2015 [employer]. This information is not intended to replace advice given to you by your health care provider. Make sure you discuss any questions you have with your health care provider. ° °

## 2015-04-23 NOTE — ED Notes (Signed)
Pt provided with meal tray at this time . Wife at bedside - No co voiced at this time

## 2015-04-23 NOTE — ED Provider Notes (Signed)
CSN: [id number]     Arrival date & time 04/23/15  0263 History  This chart was scribed for Virgel Manifold, MD by Eustaquio Maize, ED Scribe. This patient was seen in room [facility]12[facility]12 and the patient's care was started at 10:00 AM.    Chief Complaint  Patient presents with  . Fever   The history is provided by the patient and the spouse. No language interpreter was used.     HPI Comments: George Matthews is a 64 y.o. male who presents to the Emergency Department complaining of fever x 1 week. Pt also complains of dizziness, chills, and urinary frequency that began around the same time as the fever. Pt states having mild dysuria at the beginning of the week but states it has since resolved on its own. Pt also notes nausea and vomiting. Pt was seen at [facility] earlier today and transferred here for further evaluation. Denies abdominal pain, cough, shortness of breath, or any other symptoms.   Past Medical History  Diagnosis Date  . DJD (degenerative joint disease)   . Somatic dysfunction   . HTN (hypertension)   . ED (erectile dysfunction)   . Tubular adenoma of colon 2002  . Stroke     affected vision and speech mildly  . Polio     right leg/arm weakness   Past Surgical History  Procedure Laterality Date  . Colonoscopy  2002    Dr. Rehman--> Single diverticulum at the splenic flexure, tiny polyp at transverse colon, tubular adenomatous polyp.  . Colonoscopy  08/23/2012    Procedure: COLONOSCOPY;  Surgeon: Danie Binder, MD;  [facility];  Service: Endoscopy;  Laterality: N/A;  10:15   Family History  Problem Relation Age of Onset  . Colon cancer Father     age 80  . Heart attack Father     deceased age 95  . Liver disease Neg Hx    History  Substance Use Topics  . Smoking status: Former Smoker    Quit date: 06/25/1972  . Smokeless tobacco: Current User    Types: Chew  . Alcohol Use: No    Review of Systems  Constitutional: Positive for fever and chills.   Respiratory: Negative for cough and shortness of breath.   Gastrointestinal: Positive for nausea and vomiting. Negative for abdominal pain.  Genitourinary: Positive for dysuria and frequency.  Neurological: Positive for dizziness.  All other systems reviewed and are negative.   Allergies  Review of patient's allergies indicates no known allergies.  Home Medications   Prior to Admission medications   Medication Sig Start Date End Date Taking? Authorizing Provider  acetaminophen (TYLENOL) 500 MG tablet Take 500 mg by mouth every 6 (six) hours as needed for mild pain or moderate pain.    Historical Provider, MD  allopurinol (ZYLOPRIM) 100 MG tablet Take 100 mg by mouth 3 (three) times daily.     Historical Provider, MD  atorvastatin (LIPITOR) 20 MG tablet Take 20 mg by mouth every morning.     Historical Provider, MD  ergocalciferol (VITAMIN D2) 50000 UNITS capsule Take 50,000 Units by mouth 2 (two) times a week.    Historical Provider, MD  rivaroxaban (XARELTO) 20 MG TABS tablet Take 1 tablet (20 mg total) by mouth daily with supper. 07/20/14   Tammy S Parrett, NP  telmisartan-hydrochlorothiazide (MICARDIS HCT) 80-12.5 MG per tablet Take 1 tablet by mouth every morning.     Historical Provider, MD   Triage Vitals: BP 94/51 mmHg  Pulse 90  Temp(Src) 102.3 F (39.1 C) (Oral)  Resp 16  Ht 6' 3.5" (1.918 m)  Wt 232 lb (105.235 kg)  BMI 28.61 kg/m2  SpO2 94%   Physical Exam  Constitutional: He is oriented to person, place, and time. He appears well-developed and well-nourished. No distress.  Appears tired but non toxic.   HENT:  Head: Normocephalic and atraumatic.  Eyes: Conjunctivae and EOM are normal.  Neck: Neck supple. No tracheal deviation present.  Cardiovascular: Normal rate.   Pulmonary/Chest: Effort normal. No respiratory distress.  Abdominal: Soft. There is no tenderness. There is no CVA tenderness.  Musculoskeletal: Normal range of motion.  Neurological: He is alert and  oriented to person, place, and time.  Skin: Skin is warm and dry.  Psychiatric: He has a normal mood and affect. His behavior is normal.  Nursing note and vitals reviewed.   ED Course  Procedures (including critical care time)  DIAGNOSTIC STUDIES: Oxygen Saturation is 94% on RA, adequate by my interpretation.    COORDINATION OF CARE: 10:04 AM-Discussed treatment plan with pt at bedside and pt agreed to plan.   Labs Review Labs Reviewed - No data to display  Imaging Review No results found.   EKG Interpretation None      MDM   Final diagnoses:  Fever  Acute prostatitis   43 six-year-old male with fever. Patient was evaluated at urgent care prior to arrival. He reports that he had a prostate exam that he is extremely uncomfortable during this. Will treat for possible prostatitis. Patient is feeling significantly better. Ambulating without any difficulty prior to discharge. Return precautions discussed.  I personally preformed the services scribed in my presence. The recorded information has been reviewed is accurate. Virgel Manifold, MD.      Virgel Manifold, MD 04/30/15 [phone]

## 2015-04-23 NOTE — ED Notes (Signed)
Ok given by MD for diet-- call placed to dietary at this time for meal tray

## 2015-04-25 LAB — URINE CULTURE: Culture: NO GROWTH

## 2015-04-28 LAB — CULTURE, BLOOD (ROUTINE X 2)
Culture: NO GROWTH
Culture: NO GROWTH

## 2015-09-11 ENCOUNTER — Emergency Department (HOSPITAL_COMMUNITY): Payer: Medicare Other

## 2015-09-11 ENCOUNTER — Encounter (HOSPITAL_COMMUNITY): Payer: Self-pay | Admitting: *Deleted

## 2015-09-11 ENCOUNTER — Emergency Department (HOSPITAL_COMMUNITY)
Admission: EM | Admit: 2015-09-11 | Discharge: 2015-09-11 | Disposition: A | Discharge status: Home / Self Care | Payer: Medicare Other | Attending: Emergency Medicine | Admitting: Emergency Medicine

## 2015-09-11 DIAGNOSIS — Z86018 Personal history of other benign neoplasm: Secondary | ICD-10-CM | POA: Insufficient documentation

## 2015-09-11 DIAGNOSIS — Z8673 Personal history of transient ischemic attack (TIA), and cerebral infarction without residual deficits: Secondary | ICD-10-CM | POA: Insufficient documentation

## 2015-09-11 DIAGNOSIS — I10 Essential (primary) hypertension: Secondary | ICD-10-CM | POA: Diagnosis not present

## 2015-09-11 DIAGNOSIS — Z8612 Personal history of poliomyelitis: Secondary | ICD-10-CM | POA: Insufficient documentation

## 2015-09-11 DIAGNOSIS — Z79899 Other long term (current) drug therapy: Secondary | ICD-10-CM | POA: Diagnosis not present

## 2015-09-11 DIAGNOSIS — Z8739 Personal history of other diseases of the musculoskeletal system and connective tissue: Secondary | ICD-10-CM | POA: Insufficient documentation

## 2015-09-11 DIAGNOSIS — H538 Other visual disturbances: Secondary | ICD-10-CM | POA: Insufficient documentation

## 2015-09-11 DIAGNOSIS — N50811 Right testicular pain: Secondary | ICD-10-CM | POA: Diagnosis present

## 2015-09-11 DIAGNOSIS — Z7901 Long term (current) use of anticoagulants: Secondary | ICD-10-CM | POA: Diagnosis not present

## 2015-09-11 DIAGNOSIS — M6281 Muscle weakness (generalized): Secondary | ICD-10-CM | POA: Diagnosis not present

## 2015-09-11 DIAGNOSIS — N451 Epididymitis: Secondary | ICD-10-CM

## 2015-09-11 DIAGNOSIS — R52 Pain, unspecified: Secondary | ICD-10-CM

## 2015-09-11 DIAGNOSIS — Z87891 Personal history of nicotine dependence: Secondary | ICD-10-CM | POA: Diagnosis not present

## 2015-09-11 LAB — URINALYSIS, ROUTINE W REFLEX MICROSCOPIC
Bilirubin Urine: NEGATIVE
Glucose, UA: NEGATIVE mg/dL
Ketones, ur: NEGATIVE mg/dL
Nitrite: POSITIVE — AB
Specific Gravity, Urine: 1.02 (ref 1.005–1.030)
pH: 6 (ref 5.0–8.0)

## 2015-09-11 LAB — CBC
HCT: 37.1 % — ABNORMAL LOW (ref 39.0–52.0)
Hemoglobin: 12.7 g/dL — ABNORMAL LOW (ref 13.0–17.0)
MCH: 30 pg (ref 26.0–34.0)
MCHC: 34.2 g/dL (ref 30.0–36.0)
MCV: 87.7 fL (ref 78.0–100.0)
Platelets: 256 10*3/uL (ref 150–400)
RBC: 4.23 MIL/uL (ref 4.22–5.81)
RDW: 14.8 % (ref 11.5–15.5)
WBC: 12.2 10*3/uL — ABNORMAL HIGH (ref 4.0–10.5)

## 2015-09-11 LAB — BASIC METABOLIC PANEL
Anion gap: 6 (ref 5–15)
BUN: 19 mg/dL (ref 6–20)
CO2: 27 mmol/L (ref 22–32)
Calcium: 8.8 mg/dL — ABNORMAL LOW (ref 8.9–10.3)
Chloride: 104 mmol/L (ref 101–111)
Creatinine, Ser: 1.01 mg/dL (ref 0.61–1.24)
GFR calc Af Amer: >60 mL/min (ref >60)
GFR calc non Af Amer: >60 mL/min (ref >60)
Glucose, Bld: 107 mg/dL — ABNORMAL HIGH (ref 65–99)
Potassium: 4 mmol/L (ref 3.5–5.1)
Sodium: 137 mmol/L (ref 135–145)

## 2015-09-11 LAB — URINE MICROSCOPIC-ADD ON: Squamous Epithelial / LPF: NONE SEEN

## 2015-09-11 MED ORDER — HYDROCODONE-ACETAMINOPHEN 5-325 MG PO TABS
2.0000 | ORAL_TABLET | Freq: Once | ORAL | Status: AC
  Administered 2015-09-11: 2 via ORAL
  Filled 2015-09-11: qty 2

## 2015-09-11 MED ORDER — CIPROFLOXACIN HCL 250 MG PO TABS
500.0000 mg | ORAL_TABLET | Freq: Once | ORAL | Status: AC
  Administered 2015-09-11: 500 mg via ORAL
  Filled 2015-09-11: qty 2

## 2015-09-11 MED ORDER — CIPROFLOXACIN HCL 500 MG PO TABS: 500.0000 mg | ORAL_TABLET | Freq: Two times a day (BID) | ORAL | Status: DC

## 2015-09-11 MED ORDER — HYDROCODONE-ACETAMINOPHEN 5-325 MG PO TABS: 1.0000 | ORAL_TABLET | Freq: Four times a day (QID) | ORAL | Status: DC | PRN

## 2015-09-11 NOTE — ED Notes (Signed)
MD at bedside. 

## 2015-09-11 NOTE — Discharge Instructions (Signed)
Epididymitis  Take Tylenol for mild pain or the pain medicine prescribed for bad pain. Don't take Tylenol with the pain medicine prescribed take together as the combination can be dangerous to your liver. You should feel improvement in 2 or 3 days. See Dr. Melina Copa if you don't feel improvement in 2 or 3 days. Also ask Dr. Melina Copa to order a repeat ultrasound of your scrotum in 1 month to make sure that the process seen today has resolved. Return here for fever, vomiting or if you feel worse for any reason. Epididymitis is swelling (inflammation) of the epididymis. The epididymis is a cord-like structure that is located along the top and back part of the testicle. It collects and stores sperm from the testicle. This condition can also cause pain and swelling of the testicle and scrotum. Symptoms usually start suddenly (acute epididymitis). Sometimes epididymitis starts gradually and lasts for a while (chronic epididymitis). This type may be harder to treat. CAUSES In men 77 and younger, this condition is usually caused by a bacterial infection or sexually transmitted disease (STD), such as:  Gonorrhea.  Chlamydia.  In men 73 and older who do not have anal sex, this condition is usually caused by bacteria from a blockage or abnormalities in the urinary system. These can result from:  Having a tube placed into the bladder (urinary catheter).  Having an enlarged or inflamed prostate gland.  Having recent urinary tract surgery. In men who have a condition that weakens the body's defense system (immune system), such as HIV, this condition can be caused by:   Other bacteria, including tuberculosis and syphilis.  Viruses.  Fungi. Sometimes this condition occurs without infection. That may happen if urine flows backward into the epididymis after heavy lifting or straining. RISK FACTORS This condition is more likely to develop in men:  Who have unprotected sex with more than one partner.  Who  have anal sex.   Who have recently had surgery.   Who have a urinary catheter.  Who have urinary problems.  Who have a suppressed immune system. SYMPTOMS  This condition usually begins suddenly with chills, fever, and pain behind the scrotum and in the testicle. Other symptoms include:   Swelling of the scrotum, testicle, or both.  Pain whenejaculatingor urinating.  Pain in the back or belly.  Nausea.  Itching and discharge from the penis.  Frequent need to pass urine.  Redness and tenderness of the scrotum. DIAGNOSIS Your health care provider can diagnose this condition based on your symptoms and medical history. Your health care provider will also do a physical exam to ask about your symptoms and check your scrotum and testicle for swelling, pain, and redness. You may also have other tests, including:   Examination of discharge from the penis.  Urine tests for infections, such as STDs.  Your health care provider may test you for other STDs, including HIV. TREATMENT Treatment for this condition depends on the cause. If your condition is caused by a bacterial infection, oral antibiotic medicine may be prescribed. If the bacterial infection has spread to your blood, you may need to receive IV antibiotics. Nonbacterial epididymitis is treated with home care that includes bed rest and elevation of the scrotum. Surgery may be needed to treat:  Bacterial epididymitis that causes pus to build up in the scrotum (abscess).  Chronic epididymitis that has not responded to other treatments. HOME CARE INSTRUCTIONS Medicines  Take over-the-counter and prescription medicines only as told by your health care provider.  If you were prescribed an antibiotic medicine, take it as told by your health care provider. Do not stop taking the antibiotic even if your condition improves. Sexual Activity  If your epididymitis was caused by an STD, avoid sexual activity until your  treatment is complete.  Inform your sexual partner or partners if you test positive for an STD. They may need to be treated.Do not engage in sexual activity with your partner or partners until their treatment is completed. General Instructions  Return to your normal activities as told by your health care provider. Ask your health care provider what activities are safe for you.  Keep your scrotum elevated and supported while resting. Ask your health care provider if you should wear a scrotal support, such as a jockstrap. Wear it as told by your health care provider.  If directed, apply ice to the affected area:   Put ice in a plastic bag.  Place a towel between your skin and the bag.  Leave the ice on for 20 minutes, 2-3 times per day.  Try taking a sitz bath to help with discomfort. This is a warm water bath that is taken while you are sitting down. The water should only come up to your hips and should cover your buttocks. Do this 3-4 times per day or as told by your health care provider.  Keep all follow-up visits as told by your health care provider. This is important. SEEK MEDICAL CARE IF:   You have a fever.   Your pain medicine is not helping.   Your pain is getting worse.   Your symptoms do not improve within three days.   This information is not intended to replace advice given to you by your health care provider. Make sure you discuss any questions you have with your health care provider.   Document Released: 09/29/2000 Document Revised: 06/23/2015 Document Reviewed: 02/17/2015 Elsevier Interactive Patient Education [employer].

## 2015-09-11 NOTE — ED Provider Notes (Signed)
CSN: [id number]     Arrival date & time 09/11/15  [id number] History  By signing my name below, I, George Matthews, attest that this documentation has been prepared under the direction and in the presence of George Dakin, MD. Electronically Signed: Stephania Matthews, ED Scribe. 09/11/2015. 10:57 AM.    Chief Complaint  Patient presents with  . Testicle Pain   The history is provided by the patient. No language interpreter was used.   HPI Comments: [name] is a 64 y.o. male with a history of CVA affecting his vision and speech mildly (currently anticoagulated on Xarelto) and polio causing right extremity weakness, who presents to the Emergency Department complaining of gradual-onset, constant, worsening, 7/10 right testicle pain that began 2 days ago and acutely worsened last night. Pain is worse with movement. Improved with remaining still Patient has not tried anything for his symptoms. This is a new problem. He also complains of associated hematuria that began this morning. Denies back pain . No abdoimnal pain nonfever. He denies a history of smoking or EtOH consumption. Patient has NKDA.  He denies fever, SOB, vomiting, or abdominal pain.  PCP George BUTLER, DO    Past Medical History  Diagnosis Date  . DJD (degenerative joint disease)   . Somatic dysfunction   . HTN (hypertension)   . ED (erectile dysfunction)   . Tubular adenoma of colon 2002  . Stroke [facility])     affected vision and speech mildly  . Polio     right leg/arm weakness   Past Surgical History  Procedure Laterality Date  . Colonoscopy  2002    Dr. Rehman--> Single diverticulum at the splenic flexure, tiny polyp at transverse colon, tubular adenomatous polyp.  . Colonoscopy  08/23/2012    Procedure: COLONOSCOPY;  Surgeon: Danie Binder, MD;  Location: [facility];  Service: Endoscopy;  Laterality: N/A;  10:15   Family History  Problem Relation Age of Onset  . Colon cancer Father     age 79  . Heart attack Father      deceased age 51  . Liver disease Neg Hx    Social History  Substance Use Topics  . Smoking status: Former Smoker    Quit date: 06/25/1972  . Smokeless tobacco: Current User    Types: Chew  . Alcohol Use: No    Review of Systems  Constitutional: Negative for fever.  HENT: Negative.   Eyes: Positive for visual disturbance.       Monica visual loss  Respiratory: Negative.  Negative for shortness of breath.   Cardiovascular: Negative.   Gastrointestinal: Negative.  Negative for vomiting and abdominal pain.  Genitourinary: Positive for hematuria and testicular pain.  Musculoskeletal: Negative.   Skin: Negative.   Neurological: Positive for weakness.       Chronic right-sided weakness  Psychiatric/Behavioral: Negative.     Allergies  Review of patient's allergies indicates no known allergies.  Home Medications   Prior to Admission medications   Medication Sig Start Date End Date Taking? Authorizing Provider  acetaminophen (TYLENOL) 500 MG tablet Take 500 mg by mouth every 6 (six) hours as needed for mild pain or moderate pain.    Historical Provider, MD  allopurinol (ZYLOPRIM) 100 MG tablet Take 300 mg by mouth daily.     Historical Provider, MD  atorvastatin (LIPITOR) 20 MG tablet Take 20 mg by mouth every morning.     Historical Provider, MD  ergocalciferol (VITAMIN D2) 50000 UNITS capsule Take 50,000  Units by mouth 2 (two) times a week.    Historical Provider, MD  rivaroxaban (XARELTO) 20 MG TABS tablet Take 1 tablet (20 mg total) by mouth daily with supper. 07/20/14   Tammy S Parrett, NP  telmisartan-hydrochlorothiazide (MICARDIS HCT) 80-12.5 MG per tablet Take 1 tablet by mouth daily.     Historical Provider, MD   BP 154/69 mmHg  Pulse 88  Temp(Src) 98.6 F (37 C) (Oral)  Resp 16  Ht 6' 3.5" (1.918 m)  Wt 232 lb (105.235 kg)  BMI 28.61 kg/m2  SpO2 97% Physical Exam  Constitutional: He appears well-developed and well-nourished.  HENT:  Head: Normocephalic and  atraumatic.  Eyes: Conjunctivae are normal. Pupils are equal, round, and reactive to light.  Neck: Neck supple. No tracheal deviation present. No thyromegaly present.  Cardiovascular: Normal rate and regular rhythm.   No murmur heard. Pulmonary/Chest: Effort normal and breath sounds normal.  Abdominal: Soft. Bowel sounds are normal. He exhibits no distension. There is no tenderness.  Genitourinary: Penis normal.  Uncircumcised scrotum normal appearing exquisitely tender right side posteriorly and no flank tenderness  Musculoskeletal: Normal range of motion. He exhibits no edema or tenderness.  Neurological: He is alert. Coordination normal.  Skin: Skin is warm and dry. No rash noted.  Psychiatric: He has a normal mood and affect.  Nursing note and vitals reviewed.   ED Course  Procedures (including critical care time)  DIAGNOSTIC STUDIES: Oxygen Saturation is 97% on RA, normal by my interpretation.    COORDINATION OF CARE: 10:41 AM - Discussed treatment plan with pt at bedside which includes pain medication and u/s. Pt verbalized understanding and agreed to plan.   Labs Review Labs Reviewed  URINALYSIS, ROUTINE W REFLEX MICROSCOPIC (NOT AT [facility]) - Abnormal; Notable for the following:    APPearance CLOUDY (*)    Hgb urine dipstick LARGE (*)    Protein, ur TRACE (*)    Nitrite POSITIVE (*)    Leukocytes, UA LARGE (*)    All other components within normal limits  URINE MICROSCOPIC-ADD ON - Abnormal; Notable for the following:    Bacteria, UA MANY (*)    All other components within normal limits  CBC  BASIC METABOLIC PANEL   Results for orders placed or performed during the hospital encounter of 09/11/15  Urinalysis, Routine w reflex microscopic (not at [facility])  Result Value Ref Range   Color, Urine YELLOW YELLOW   APPearance CLOUDY (A) CLEAR   Specific Gravity, Urine 1.020 1.005 - 1.030   pH 6.0 5.0 - 8.0   Glucose, UA NEGATIVE NEGATIVE mg/dL   Hgb urine dipstick LARGE (A)  NEGATIVE   Bilirubin Urine NEGATIVE NEGATIVE   Ketones, ur NEGATIVE NEGATIVE mg/dL   Protein, ur TRACE (A) NEGATIVE mg/dL   Nitrite POSITIVE (A) NEGATIVE   Leukocytes, UA LARGE (A) NEGATIVE  Urine microscopic-add on  Result Value Ref Range   Squamous Epithelial / LPF NONE SEEN NONE SEEN   WBC, UA TOO NUMEROUS TO COUNT 0 - 5 WBC/hpf   RBC / HPF TOO NUMEROUS TO COUNT 0 - 5 RBC/hpf   Bacteria, UA MANY (A) NONE SEEN  CBC  Result Value Ref Range   WBC 12.2 (H) 4.0 - 10.5 K/uL   RBC 4.23 4.22 - 5.81 MIL/uL   Hemoglobin 12.7 (L) 13.0 - 17.0 g/dL   HCT 37.1 (L) 39.0 - 52.0 %   MCV 87.7 78.0 - 100.0 fL   MCH 30.0 26.0 - 34.0 pg  MCHC 34.2 30.0 - 36.0 g/dL   RDW 14.8 11.5 - 15.5 %   Platelets 256 150 - 400 K/uL  Basic metabolic panel  Result Value Ref Range   Sodium 137 135 - 145 mmol/L   Potassium 4.0 3.5 - 5.1 mmol/L   Chloride 104 101 - 111 mmol/L   CO2 27 22 - 32 mmol/L   Glucose, Bld 107 (H) 65 - 99 mg/dL   BUN 19 6 - 20 mg/dL   Creatinine, Ser 1.01 0.61 - 1.24 mg/dL   Calcium 8.8 (L) 8.9 - 10.3 mg/dL   GFR calc non Af Amer >60 >60 mL/min   GFR calc Af Amer >60 >60 mL/min   Anion gap 6 5 - 15   US Scrotum  09/11/2015  CLINICAL DATA:  Right-sided pain for 4 days. EXAM: SCROTAL ULTRASOUND TECHNIQUE: Complete ultrasound examination of the testicles, epididymis, and other scrotal structures was performed. Color and spectral Doppler ultrasound were also utilized to evaluate blood flow to the testicles. COMPARISON:  None. FINDINGS: Right testicle Measurements: 3.8 x 2.0 x 3.0 cm . No mass or microlithiasis visualized. Left testicle Measurements: 3.7 x 1.6 x 2.6 cm. Small complex cystic lesion in the left testicle measures up to 0.5 cm. Small echogenic foci in the left testicle are suggestive for small calcifications. Right epididymis: The right epididymis appears to be markedly enlarged and hypervascular. Left epididymis: There are 2 large cystic structures involving the left epididymis  that probably represent spermatoceles. One of these cysts appear complex with layering echogenic material. Largest cystic structure measures up to 1.7 cm. Smaller lesion measures up to 1.2 cm. Hydrocele: Small bilateral hydroceles. Varicocele: None visualized. Pulsed Doppler interrogation of both testes demonstrates normal low resistance arterial and venous waveforms bilaterally. IMPRESSION: The right epididymis is markedly enlarged and hypervascular. Findings are suggestive for acute epididymitis. Indeterminate lesion in the left testicle measuring up to 0.5 cm that may represent a complex cystic structure. This could represent a spermatocele associated with the rete testis but indeterminate. Left spermatoceles. Recommend a 3-4 week follow-up ultrasound to confirm that the enlargement and abnormality in the right epididymis resolves and separate from the right testicle. In addition, recommend follow-up of the indeterminate complex cystic structure in the left testicle. Small partially cystic neoplasm cannot be excluded. No evidence for testicular torsion. Electronically Signed   By: Markus Daft M.D.   On: 09/11/2015 13:24   [place] Art/ven Flow Abd Pelv Doppler  09/11/2015  CLINICAL DATA:  Right-sided pain for 4 days. EXAM: SCROTAL ULTRASOUND TECHNIQUE: Complete ultrasound examination of the testicles, epididymis, and other scrotal structures was performed. Color and spectral Doppler ultrasound were also utilized to evaluate blood flow to the testicles. COMPARISON:  None. FINDINGS: Right testicle Measurements: 3.8 x 2.0 x 3.0 cm . No mass or microlithiasis visualized. Left testicle Measurements: 3.7 x 1.6 x 2.6 cm. Small complex cystic lesion in the left testicle measures up to 0.5 cm. Small echogenic foci in the left testicle are suggestive for small calcifications. Right epididymis: The right epididymis appears to be markedly enlarged and hypervascular. Left epididymis: There are 2 large cystic structures involving  the left epididymis that probably represent spermatoceles. One of these cysts appear complex with layering echogenic material. Largest cystic structure measures up to 1.7 cm. Smaller lesion measures up to 1.2 cm. Hydrocele: Small bilateral hydroceles. Varicocele: None visualized. Pulsed Doppler interrogation of both testes demonstrates normal low resistance arterial and venous waveforms bilaterally. IMPRESSION: The right epididymis is markedly enlarged  and hypervascular. Findings are suggestive for acute epididymitis. Indeterminate lesion in the left testicle measuring up to 0.5 cm that may represent a complex cystic structure. This could represent a spermatocele associated with the rete testis but indeterminate. Left spermatoceles. Recommend a 3-4 week follow-up ultrasound to confirm that the enlargement and abnormality in the right epididymis resolves and separate from the right testicle. In addition, recommend follow-up of the indeterminate complex cystic structure in the left testicle. Small partially cystic neoplasm cannot be excluded. No evidence for testicular torsion. Electronically Signed   By: Markus Daft M.D.   On: 09/11/2015 13:24   Imaging Review No results found. I have personally reviewed and evaluated these images and lab results as part of my medical decision-making. 2:20 PM pain improved after treatment with Norco and patient feels ready to go home. MDM  Scrotal ultrasound study discussed with Dr.Henn who feels that scrotal ultrasound should be repeated in one month. Clinically patient has epididymitis Final diagnoses:  None   plan urine sent for culture. Prescriptions Cipro, Norco.f/u with PMD Dr Melina Copa if not improving in 2 or 3 days, scrotal u/s 1 month to ensure resolution  Dx Acute epididimitis      George Dakin, MD 09/11/15 1425

## 2015-09-11 NOTE — ED Notes (Signed)
Pt to US.

## 2015-09-11 NOTE — ED Notes (Signed)
Pt states pain to right testicle first noticed on Thursday. States he noticed blood in his urine this morning

## 2015-09-14 LAB — URINE CULTURE: Culture: >=100000

## 2015-09-15 ENCOUNTER — Telehealth (HOSPITAL_BASED_OUTPATIENT_CLINIC_OR_DEPARTMENT_OTHER): Payer: Self-pay | Admitting: Emergency Medicine

## 2015-09-15 NOTE — Telephone Encounter (Signed)
Post ED Visit - Positive Culture Follow-up  Culture report reviewed by antimicrobial stewardship pharmacist:  []  Elenor Quinones, Pharm.D. []  Heide Guile, Pharm.D., BCPS []  Parks Neptune, Pharm.D. []  Alycia Rossetti, Pharm.D., BCPS []  [place], Pharm.D., BCPS, AAHIVP []  Legrand Como, Pharm.D., BCPS, AAHIVP []  Milus Glazier, Pharm.D. [x]  Stephens November, Pharm.D.  Positive urine culture E. coli Treated with ciprofloxacin, organism sensitive to the same and no further patient follow-up is required at this time.  Hazle Nordmann 09/15/2015, 12:12 PM

## 2015-12-19 IMAGING — CT CT ANGIO CHEST
1 of 6 series · 5 of 36 positions shown · IV contrast (Omnipaque 300)
Comparison: Chest radiograph dated 06/25/2014

CLINICAL DATA: Progressive shortness of breath

EXAM:
CT ANGIOGRAPHY CHEST WITH CONTRAST
TECHNIQUE: Multidetector CT imaging of the chest was performed using the
standard protocol during bolus administration of intravenous
contrast. Multiplanar CT image reconstructions and MIPs were
obtained to evaluate the vascular anatomy.
CONTRAST:  100mL OMNIPAQUE IOHEXOL 350 MG/ML SOLN

[Series 4: pe 3.0 b40f · axial · 0.66mm/px · z∈[+12,+210]mm · 5 of 100 slices shown]
[im 17/100  lung]
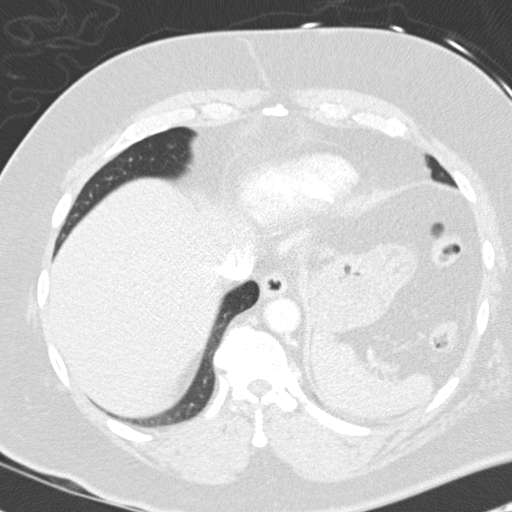
[im 34/100  mediastinal]
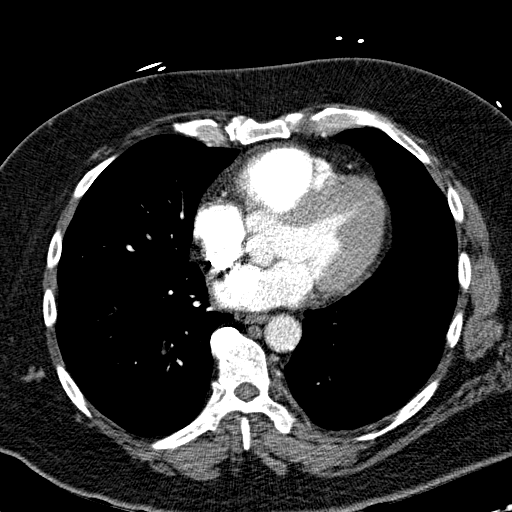
[im 50/100  lung]
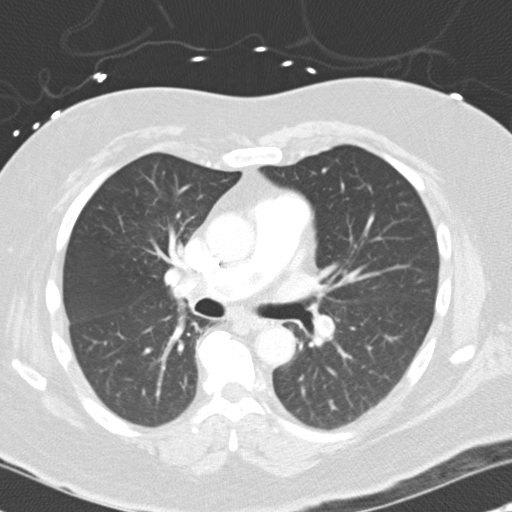
[im 67/100  mediastinal]
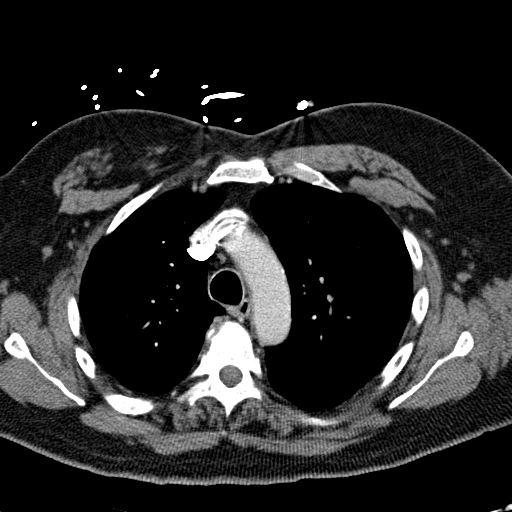
[im 83/100  lung]
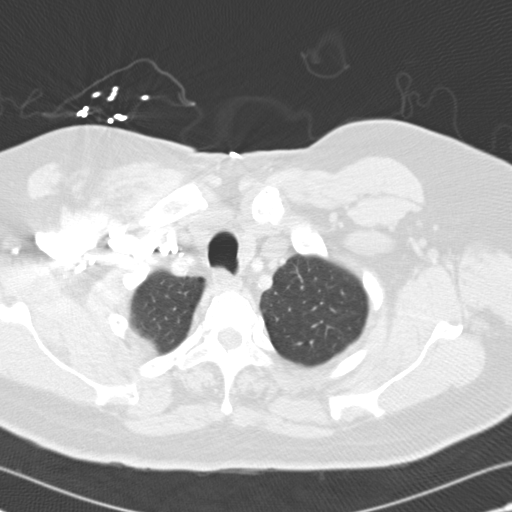

[5 of 36 positions shown; findings below may reference images not displayed]

FINDINGS: Bilateral pulmonary emboli with a small amount of saddle embolus at
the bifurcation (series 8/image 143), extensive clot in the left
main pulmonary artery (series 8/ image 125), and additional lobe are
thrombus of the bifurcation of the right upper, middle, and lower
pulmonary arteries (series 8/image 169). Overall clot burden is
moderate to large.

Elevated RV to LV ratio (1.28), raising the possibility of right
heart strain.

1.5 cm patchy subpleural nodule at the left lung base (series 5/
image 73). No pleural effusion or pneumothorax.

Visualized thyroid is unremarkable.

Heart is normal in size. No pericardial effusion. Coronary
atherosclerosis. Atherosclerotic calcifications of the aortic arch.

Visualized upper abdomen is unremarkable.

Degenerative changes of the visualized thoracolumbar spine.

Review of the MIP images confirms the above findings.
IMPRESSION: Bilateral pulmonary emboli, as above. Overall clot burden is
moderate to large. Elevated RV to LV ratio, raising the possibility
of right heart strain.

Positive for acute PE with CT evidence of right heart strain (RV/LV
Ratio = 1.28) consistent with at least submassive (intermediate
risk) PE. The presence of right heart strain has been associated
with an increased risk of morbidity and mortality.

1.5 cm subpleural nodule at the left lung base. Initial follow-up CT
chest is suggested in 3 months.

This recommendation follows the consensus statement: Guidelines for
Management of Small Pulmonary Nodules Detected on CT Scans: A
Statement from the [HOSPITAL] as published in Radiology
0444; [DATE].

Critical value/emergent results were called by telephone at the time
of interpretation on 06/25/2014 at [DATE] to Dr. RASHED KHAN BARTAULA , who
verbally acknowledged these results.

## 2018-01-14 DEATH — deceased

## 2020-04-10 ENCOUNTER — Encounter (HOSPITAL_COMMUNITY): Payer: Self-pay | Admitting: Emergency Medicine

## 2020-04-10 ENCOUNTER — Other Ambulatory Visit: Payer: Self-pay

## 2020-04-10 ENCOUNTER — Emergency Department (HOSPITAL_COMMUNITY)
Admission: EM | Admit: 2020-04-10 | Discharge: 2020-04-11 | Disposition: A | Discharge status: Home / Self Care | Payer: Medicare HMO | Attending: Emergency Medicine | Admitting: Emergency Medicine

## 2020-04-10 DIAGNOSIS — I10 Essential (primary) hypertension: Secondary | ICD-10-CM | POA: Diagnosis not present

## 2020-04-10 DIAGNOSIS — Z87891 Personal history of nicotine dependence: Secondary | ICD-10-CM | POA: Insufficient documentation

## 2020-04-10 DIAGNOSIS — G459 Transient cerebral ischemic attack, unspecified: Secondary | ICD-10-CM | POA: Insufficient documentation

## 2020-04-10 DIAGNOSIS — H538 Other visual disturbances: Secondary | ICD-10-CM | POA: Insufficient documentation

## 2020-04-10 DIAGNOSIS — R42 Dizziness and giddiness: Secondary | ICD-10-CM

## 2020-04-10 NOTE — ED Notes (Signed)
ED Provider at bedside. 

## 2020-04-10 NOTE — ED Triage Notes (Signed)
Patient states dizziness x 1 week and denies any pain. Patient states dizziness is off and on.

## 2020-04-10 NOTE — ED Provider Notes (Signed)
[facility] EMERGENCY DEPARTMENT Provider Note   CSN: [id number] Arrival date & time: 04/10/20  1958   Time seen 11:40 PM  History Chief Complaint  Patient presents with  . Dizziness    George Matthews is a 69 y.o. male.  HPI   Patient states a week ago he started having "spells".  He states during them his vision gets blurred and he feels like he is going to pass out and he has some spinning sensation.  He does not have nausea or vomiting with it.  He sometimes feels like he is falling even when he is sitting in a chair.  He states they last 3 to 5 minutes.  He has them more in the morning than the afternoon but he has had them in the afternoon.  He states he is having about 2 episodes a day.  The last time was this afternoon.  He states when he has it it gets worse if he stands up or he looks to the right.  He denies headache, numbness or tingling of his extremities.  He denies chest pain, palpitations or shortness of breath.  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  He states 3 weeks ago his lips felt funny and he went to see his primary care doctor who was setting him up to have a monitor done.  He states he has never had these symptoms before. Gets  PCP Scotty Court, DO  Past Medical History:  Diagnosis Date  . DJD (degenerative joint disease)   . ED (erectile dysfunction)   . HTN (hypertension)   . Polio    right leg/arm weakness  . Somatic dysfunction   . Stroke [facility])    affected vision and speech mildly  . Tubular adenoma of colon 2002    Patient Active Problem List   Diagnosis Date Noted  . Pulmonary nodule 07/20/2014  . Pulmonary embolism ([place]) 06/25/2014  . FH: colon cancer 08/20/2012  . Hx of adenomatous colonic polyps 08/20/2012    Past Surgical History:  Procedure Laterality Date  . COLONOSCOPY  2002   Dr. Laural Golden Single diverticulum at the splenic flexure, tiny polyp at transverse colon, tubular adenomatous polyp.  . COLONOSCOPY   08/23/2012   Procedure: COLONOSCOPY;  Surgeon: Danie Binder, MD;  [facility];  Service: Endoscopy;  Laterality: N/A;  10:15       Family History  Problem Relation Age of Onset  . Colon cancer Father        age 62  . Heart attack Father        deceased age 72  . Liver disease Neg Hx     Social History   Tobacco Use  . Smoking status: Former Smoker    Quit date: 06/25/1972    Years since quitting: 47.8  . Smokeless tobacco: Current User    Types: Chew  Substance Use Topics  . Alcohol use: No  . Drug use: No    Home Medications Prior to Admission medications   Medication Sig Start Date End Date Taking? Authorizing Provider  acetaminophen (TYLENOL) 500 MG tablet Take 500 mg by mouth every 6 (six) hours as needed for mild pain or moderate pain.    [provider]  allopurinol (ZYLOPRIM) 100 MG tablet Take 300 mg by mouth daily.     [provider]  atorvastatin (LIPITOR) 20 MG tablet Take 20 mg by mouth every morning.     [provider]  ciprofloxacin (CIPRO) 500 MG tablet Take 1 tablet (500 mg total) by mouth 2 (two) times daily. One po bid x 10 days 09/11/15   Orlie Dakin, MD  ergocalciferol (VITAMIN D2) 50000 UNITS capsule Take 50,000 Units by mouth 2 (two) times a week.    [provider]  HYDROcodone-acetaminophen (NORCO) 5-325 MG tablet Take 1-2 tablets by mouth every 6 (six) hours as needed for severe pain. 09/11/15   Orlie Dakin, MD  meclizine (ANTIVERT) 25 MG tablet Take 1 tablet (25 mg total) by mouth 3 (three) times daily as needed for dizziness. 04/11/20   Rolland Porter, MD  Rivaroxaban (XARELTO) 15 MG TABS tablet Take 15 mg by mouth every morning.    [provider]  telmisartan-hydrochlorothiazide (MICARDIS HCT) 80-12.5 MG per tablet Take 1 tablet by mouth daily.     [provider]    Allergies    Patient has no known allergies.  Review of Systems   Review of Systems  All other systems  reviewed and are negative.   Physical Exam Updated Vital Signs BP (!) 158/84   Pulse (!) 51   Temp 98.3 F (36.8 C) (Oral)   Resp 16   Ht 6' 3.5" (1.918 m)   Wt 117.9 kg   SpO2 98%   BMI 32.07 kg/m   Vital signs normal except for bradycardia   Physical Exam Vitals and nursing note reviewed.  Constitutional:      Appearance: Normal appearance. He is obese.  HENT:     Head: Normocephalic and atraumatic.     Right Ear: External ear normal.     Left Ear: External ear normal.     Mouth/Throat:     Mouth: Mucous membranes are moist.     Pharynx: No oropharyngeal exudate or posterior oropharyngeal erythema.  Eyes:     Extraocular Movements: Extraocular movements intact.     Right eye: No nystagmus.     Left eye: No nystagmus.     Conjunctiva/sclera: Conjunctivae normal.     Pupils: Pupils are equal, round, and reactive to light.  Neck:     Vascular: No carotid bruit.  Cardiovascular:     Rate and Rhythm: Regular rhythm. Bradycardia present.     Pulses: Normal pulses.     Heart sounds: Murmur heard.   Pulmonary:     Effort: Pulmonary effort is normal. No respiratory distress.     Breath sounds: Normal breath sounds.  Abdominal:     General: There is distension.  Musculoskeletal:        General: Normal range of motion.  Skin:    General: Skin is warm and dry.  Neurological:     General: No focal deficit present.     Mental Status: He is alert and oriented to person, place, and time.     Cranial Nerves: No cranial nerve deficit.  Psychiatric:        Mood and Affect: Mood normal.        Behavior: Behavior normal.        Thought Content: Thought content normal.     ED Results / Procedures / Treatments   Labs (all labs ordered are listed, but only abnormal results are displayed) Labs Reviewed - No data to display  EKG EKG Interpretation  Date/Time:  Saturday April 10 2020 21:42:01 EDT Ventricular Rate:  65 PR Interval:    QRS Duration: 107 QT  Interval:  442 QTC Calculation: 460 R Axis:   -32 Text Interpretation: Sinus  rhythm Left axis deviation Low voltage, precordial leads RSR' in V1 or V2, probably normal variant Consider anterior infarct Confirmed by Rolland Porter [phone]) on 04/10/2020 11:07:14 PM   Radiology No results found.  Procedures Procedures (including critical care time)  Medications Ordered in ED Medications  meclizine (ANTIVERT) tablet 25 mg (has no administration in time range)    ED Course  I have reviewed the triage vital signs and the nursing notes.  Pertinent labs & imaging results that were available during my care of the patient were reviewed by me and considered in my medical decision making (see chart for details).    MDM Rules/Calculators/A&P                          Patient symptoms sound like vertigo episodes.  He has no chest pain, shortness of breath, or palpitations.  He does have a history of PE however he had notes history of PE in 2015 however he did have shortness of breath.  He has no worrisome neurological symptoms.   Final Clinical Impression(s) / ED Diagnoses Final diagnoses:  Vertigo    Rx / DC Orders ED Discharge Orders         Ordered    meclizine (ANTIVERT) 25 MG tablet  3 times daily PRN     Discontinue  Reprint     04/11/20 0005          Plan discharge  Rolland Porter, MD, Barbette Or, MD 04/11/20 [phone]

## 2020-04-11 MED ORDER — MECLIZINE HCL 25 MG PO TABS: 25.0000 mg | ORAL_TABLET | Freq: Three times a day (TID) | ORAL | 0 refills | Status: DC | PRN

## 2020-04-11 MED ORDER — MECLIZINE HCL 12.5 MG PO TABS
25.0000 mg | ORAL_TABLET | Freq: Once | ORAL | Status: AC
  Administered 2020-04-11: 25 mg via ORAL
  Filled 2020-04-11: qty 2

## 2020-04-11 NOTE — Discharge Instructions (Addendum)
Take the meclizine as needed for dizziness.  Please let your doctor know if you are not improving by the middle of this coming week.  Do not drive a car until your symptoms have not occurred for at least 1 to 2 days.  Be very careful going up or down stairs or any activity where you could get hurt if he had a dizzy spell.  Recheck if you get headache, prolonged dizziness, chest pain or shortness of breath or new numbness or tingling of your extremities.

## 2020-07-13 ENCOUNTER — Encounter (INDEPENDENT_AMBULATORY_CARE_PROVIDER_SITE_OTHER): Payer: Self-pay

## 2020-08-13 ENCOUNTER — Other Ambulatory Visit (INDEPENDENT_AMBULATORY_CARE_PROVIDER_SITE_OTHER): Payer: Self-pay

## 2020-08-13 DIAGNOSIS — Z1211 Encounter for screening for malignant neoplasm of colon: Secondary | ICD-10-CM

## 2020-09-03 ENCOUNTER — Encounter (INDEPENDENT_AMBULATORY_CARE_PROVIDER_SITE_OTHER): Payer: Self-pay

## 2020-09-03 ENCOUNTER — Telehealth (INDEPENDENT_AMBULATORY_CARE_PROVIDER_SITE_OTHER): Payer: Self-pay

## 2020-09-03 MED ORDER — PLENVU 140 G PO SOLR: 1.0000 | Freq: Once | ORAL | 0 refills | Status: AC

## 2020-09-03 NOTE — Telephone Encounter (Signed)
Patient has coupon card 

## 2020-09-03 NOTE — Telephone Encounter (Signed)
Ok to schedule.  Thanks,   Castaneda Mayorga, MD Gastroenterology and Hepatology  [facility]  

## 2020-09-03 NOTE — Telephone Encounter (Signed)
Referring MD/PCP: Georgina Quint   Procedure: Tcs  Reason/Indication:  Screening  Has patient had this procedure before?  Yes 2010  If so, when, by whom and where?    Is there a family history of colon cancer?  no  Who?  What age when diagnosed?    Is patient diabetic?   no      Does patient have prosthetic heart valve or mechanical valve?  no  Do you have a pacemaker/defibrillator?  no  Has patient ever had endocarditis/atrial fibrillation? no  Does patient use oxygen? no  Has patient had joint replacement within last 12 months?  no  Is patient constipated or do they take laxatives? no  Does patient have a history of alcohol/drug use?  no  Is patient on blood thinner such as Coumadin, Plavix and/or Aspirin? yes  Medications: Xarelto 15mg  daily Joline Salt), Nasonex nasal Spray, Vit D twice a week, Telmisartan/hctz 80/12/5 mg daily, allopurinol 300mg   Daily, atorvastatin 10mg  daily  Allergies: nkda  Medication Adjustment per Dr Ihor Austin 2 days prior to procedure  Procedure date & time: 10/01/20 at 7:30

## 2020-09-06 ENCOUNTER — Encounter (INDEPENDENT_AMBULATORY_CARE_PROVIDER_SITE_OTHER): Payer: Self-pay | Admitting: *Deleted

## 2020-09-28 ENCOUNTER — Telehealth (INDEPENDENT_AMBULATORY_CARE_PROVIDER_SITE_OTHER): Payer: Self-pay

## 2020-09-28 DIAGNOSIS — Z1211 Encounter for screening for malignant neoplasm of colon: Secondary | ICD-10-CM

## 2020-09-28 MED ORDER — NA SULFATE-K SULFATE-MG SULF 17.5-3.13-1.6 GM/177ML PO SOLN: 354.0000 mL | Freq: Once | ORAL | 0 refills | Status: AC

## 2020-09-28 NOTE — Telephone Encounter (Signed)
LeighAnn , CMA  

## 2020-09-28 NOTE — Patient Instructions (Signed)
George Matthews  09/28/2020     @PREFPERIOPPHARMACY @   Your procedure is scheduled on  10/01/2020.  Report to [facility] at  [phone]  A.M.  Call this number if you have problems the morning of surgery:  [phone]   Remember:  Follow the diet and prep instructions given to you by the office.                      Take these medicines the morning of surgery with A SIP OF WATER  Allopurinol. Your last dose of xarelto should be 09/28/2020.    Do not wear jewelry, make-up or nail polish.  Do not wear lotions, powders, or perfumes. Please wear deodorant and brush your teeth.  Do not shave 48 hours prior to surgery.  Men may shave face and neck.  Do not bring valuables to the hospital.  [facility] is not responsible for any belongings or valuables.  Contacts, dentures or bridgework may not be worn into surgery.  Leave your suitcase in the car.  After surgery it may be brought to your room.  For patients admitted to the hospital, discharge time will be determined by your treatment team.  Patients discharged the day of surgery will not be allowed to drive home.   Name and phone number of your driver:   family Special instructions:  DO NOT smoke the morning of your procedure.  Please read over the following fact sheets that you were given. Anesthesia Post-op Instructions and Care and Recovery After Surgery      Colonoscopy, Adult, Care After This sheet gives you information about how to care for yourself after your procedure. Your health care provider may also give you more specific instructions. If you have problems or questions, contact your health care provider. What can I expect after the procedure? After the procedure, it is common to have:  A small amount of blood in your stool for 24 hours after the procedure.  Some gas.  Mild cramping or bloating of your abdomen. Follow these instructions at home: Eating and drinking   Drink enough fluid to keep your urine pale  yellow.  Follow instructions from your health care provider about eating or drinking restrictions.  Resume your normal diet as instructed by your health care provider. Avoid heavy or fried foods that are hard to digest. Activity  Rest as told by your health care provider.  Avoid sitting for a long time without moving. Get up to take short walks every 1-2 hours. This is important to improve blood flow and breathing. Ask for help if you feel weak or unsteady.  Return to your normal activities as told by your health care provider. Ask your health care provider what activities are safe for you. Managing cramping and bloating   Try walking around when you have cramps or feel bloated.  Apply heat to your abdomen as told by your health care provider. Use the heat source that your health care provider recommends, such as a moist heat pack or a heating pad. ? Place a towel between your skin and the heat source. ? Leave the heat on for 20-30 minutes. ? Remove the heat if your skin turns bright red. This is especially important if you are unable to feel pain, heat, or cold. You may have a greater risk of getting burned. General instructions  For the first 24 hours after the procedure: ? Do not drive or use machinery. ?  Do not sign important documents. ? Do not drink alcohol. ? Do your regular daily activities at a slower pace than normal. ? Eat soft foods that are easy to digest.  Take over-the-counter and prescription medicines only as told by your health care provider.  Keep all follow-up visits as told by your health care provider. This is important. Contact a health care provider if:  You have blood in your stool 2-3 days after the procedure. Get help right away if you have:  More than a small spotting of blood in your stool.  Large blood clots in your stool.  Swelling of your abdomen.  Nausea or vomiting.  A fever.  Increasing pain in your abdomen that is not relieved with  medicine. Summary  After the procedure, it is common to have a small amount of blood in your stool. You may also have mild cramping and bloating of your abdomen.  For the first 24 hours after the procedure, do not drive or use machinery, sign important documents, or drink alcohol.  Get help right away if you have a lot of blood in your stool, nausea or vomiting, a fever, or increased pain in your abdomen. This information is not intended to replace advice given to you by your health care provider. Make sure you discuss any questions you have with your health care provider. Document Revised: 04/28/2019 Document Reviewed: 04/28/2019 Elsevier Patient Education  [place].

## 2020-09-29 ENCOUNTER — Encounter (HOSPITAL_COMMUNITY): Payer: Self-pay

## 2020-09-29 ENCOUNTER — Encounter (HOSPITAL_COMMUNITY)
Admission: RE | Admit: 2020-09-29 | Discharge: 2020-09-29 | Disposition: A | Discharge status: Home / Self Care | Payer: Medicare HMO | Source: Ambulatory Visit | Attending: Gastroenterology | Admitting: Gastroenterology

## 2020-09-29 ENCOUNTER — Other Ambulatory Visit (HOSPITAL_COMMUNITY)
Admission: RE | Admit: 2020-09-29 | Discharge: 2020-09-29 | Disposition: A | Discharge status: Home / Self Care | Payer: Medicare HMO | Source: Ambulatory Visit | Attending: Gastroenterology | Admitting: Gastroenterology

## 2020-09-29 ENCOUNTER — Other Ambulatory Visit: Payer: Self-pay

## 2020-09-29 DIAGNOSIS — Z01812 Encounter for preprocedural laboratory examination: Secondary | ICD-10-CM | POA: Insufficient documentation

## 2020-09-29 DIAGNOSIS — Z20822 Contact with and (suspected) exposure to covid-19: Secondary | ICD-10-CM | POA: Insufficient documentation

## 2020-09-29 LAB — BASIC METABOLIC PANEL
Anion gap: 7 (ref 5–15)
BUN: 19 mg/dL (ref 8–23)
CO2: 27 mmol/L (ref 22–32)
Calcium: 9 mg/dL (ref 8.9–10.3)
Chloride: 103 mmol/L (ref 98–111)
Creatinine, Ser: 1.24 mg/dL (ref 0.61–1.24)
GFR, Estimated: >60 mL/min (ref >60)
Glucose, Bld: 113 mg/dL — ABNORMAL HIGH (ref 70–99)
Potassium: 4.2 mmol/L (ref 3.5–5.1)
Sodium: 137 mmol/L (ref 135–145)

## 2020-09-29 LAB — SARS CORONAVIRUS 2 (TAT 6-24 HRS): SARS Coronavirus 2: NEGATIVE

## 2020-10-01 ENCOUNTER — Ambulatory Visit (HOSPITAL_COMMUNITY)
Admission: RE | Admit: 2020-10-01 | Discharge: 2020-10-01 | Disposition: A | Discharge status: Home / Self Care | Payer: Medicare HMO | Attending: Gastroenterology | Admitting: Gastroenterology

## 2020-10-01 ENCOUNTER — Ambulatory Visit (HOSPITAL_COMMUNITY): Payer: Medicare HMO

## 2020-10-01 ENCOUNTER — Encounter (HOSPITAL_COMMUNITY): Payer: Self-pay | Admitting: Gastroenterology

## 2020-10-01 ENCOUNTER — Encounter (HOSPITAL_COMMUNITY)
Admission: RE | Disposition: A | Discharge status: Home / Self Care | Payer: Self-pay | Source: Home / Self Care | Attending: Gastroenterology

## 2020-10-01 DIAGNOSIS — I10 Essential (primary) hypertension: Secondary | ICD-10-CM | POA: Insufficient documentation

## 2020-10-01 DIAGNOSIS — D125 Benign neoplasm of sigmoid colon: Secondary | ICD-10-CM | POA: Diagnosis not present

## 2020-10-01 DIAGNOSIS — Z86010 Personal history of colonic polyps: Secondary | ICD-10-CM | POA: Diagnosis not present

## 2020-10-01 DIAGNOSIS — Z8249 Family history of ischemic heart disease and other diseases of the circulatory system: Secondary | ICD-10-CM | POA: Diagnosis not present

## 2020-10-01 DIAGNOSIS — Z8612 Personal history of poliomyelitis: Secondary | ICD-10-CM | POA: Diagnosis not present

## 2020-10-01 DIAGNOSIS — Z79899 Other long term (current) drug therapy: Secondary | ICD-10-CM | POA: Insufficient documentation

## 2020-10-01 DIAGNOSIS — Z7901 Long term (current) use of anticoagulants: Secondary | ICD-10-CM | POA: Diagnosis not present

## 2020-10-01 DIAGNOSIS — K648 Other hemorrhoids: Secondary | ICD-10-CM | POA: Diagnosis not present

## 2020-10-01 DIAGNOSIS — Z86711 Personal history of pulmonary embolism: Secondary | ICD-10-CM | POA: Insufficient documentation

## 2020-10-01 DIAGNOSIS — Z1211 Encounter for screening for malignant neoplasm of colon: Secondary | ICD-10-CM | POA: Diagnosis not present

## 2020-10-01 DIAGNOSIS — Z800 Family history of malignant neoplasm of digestive organs: Secondary | ICD-10-CM | POA: Diagnosis not present

## 2020-10-01 DIAGNOSIS — D175 Benign lipomatous neoplasm of intra-abdominal organs: Secondary | ICD-10-CM | POA: Diagnosis not present

## 2020-10-01 DIAGNOSIS — Z885 Allergy status to narcotic agent status: Secondary | ICD-10-CM | POA: Insufficient documentation

## 2020-10-01 DIAGNOSIS — Z87891 Personal history of nicotine dependence: Secondary | ICD-10-CM | POA: Insufficient documentation

## 2020-10-01 DIAGNOSIS — K5730 Diverticulosis of large intestine without perforation or abscess without bleeding: Secondary | ICD-10-CM | POA: Insufficient documentation

## 2020-10-01 DIAGNOSIS — Z86718 Personal history of other venous thrombosis and embolism: Secondary | ICD-10-CM | POA: Insufficient documentation

## 2020-10-01 DIAGNOSIS — Z8673 Personal history of transient ischemic attack (TIA), and cerebral infarction without residual deficits: Secondary | ICD-10-CM | POA: Diagnosis not present

## 2020-10-01 HISTORY — PX: POLYPECTOMY: SHX5525

## 2020-10-01 HISTORY — PX: COLONOSCOPY WITH PROPOFOL: SHX5780

## 2020-10-01 LAB — HM COLONOSCOPY

## 2020-10-01 SURGERY — COLONOSCOPY WITH PROPOFOL
Anesthesia: General

## 2020-10-01 MED ORDER — CHLORHEXIDINE GLUCONATE CLOTH 2 % EX PADS: 6.0000 | MEDICATED_PAD | Freq: Once | CUTANEOUS | Status: DC

## 2020-10-01 MED ORDER — PROPOFOL 500 MG/50ML IV EMUL
INTRAVENOUS | Status: DC | PRN
  Administered 2020-10-01: 100 ug/kg/min via INTRAVENOUS

## 2020-10-01 MED ORDER — LIDOCAINE HCL (CARDIAC) PF 100 MG/5ML IV SOSY
PREFILLED_SYRINGE | INTRAVENOUS | Status: DC | PRN
  Administered 2020-10-01: 50 mg via INTRAVENOUS

## 2020-10-01 MED ORDER — LACTATED RINGERS IV SOLN: INTRAVENOUS | Status: DC

## 2020-10-01 MED ORDER — PROPOFOL 10 MG/ML IV BOLUS
INTRAVENOUS | Status: DC | PRN
Start: 2020-10-01 — End: 2020-10-01
  Administered 2020-10-01 (×2): 20 mg via INTRAVENOUS
  Administered 2020-10-01: 60 mg via INTRAVENOUS

## 2020-10-01 NOTE — H&P (Signed)
George Matthews is an 69 y.o. male.   Chief Complaint: History of colon polyps HPI: 69 year old male with medical history of hypertension, pulmonary embolism, stroke, coming for surveillance of colon polyps. The patient had a colonoscopy uin 2013, had two polyps removed.  The patient denies having any complaints such as melena, hematochezia, abdominal pain or distention, change in her bowel movement consistency or frequency, no changes in her weight recently.  No family history of colorectal cancer.  Past Medical History:  Diagnosis Date  . DJD (degenerative joint disease)   . DVT (deep venous thrombosis) ([place]) 06/25/2014  . ED (erectile dysfunction)   . HTN (hypertension)   . Polio    right leg/arm weakness  . Pulmonary embolism ([place]) 06/25/2014  . Somatic dysfunction   . Stroke [facility])    affected vision and speech mildly  . Tubular adenoma of colon 2002    Past Surgical History:  Procedure Laterality Date  . COLONOSCOPY  2002   Dr. Laural Golden Single diverticulum at the splenic flexure, tiny polyp at transverse colon, tubular adenomatous polyp.  . COLONOSCOPY  08/23/2012   Procedure: COLONOSCOPY;  Surgeon: Danie Binder, MD;  Location[facility];  Service: Endoscopy;  Laterality: N/A;  10:15    Family History  Problem Relation Age of Onset  . Colon cancer Father        age 81  . Heart attack Father        deceased age 84  . Liver disease Neg Hx    Social History:  reports that he quit smoking about 48 years ago. His smokeless tobacco use includes chew. He reports that he does not drink alcohol and does not use drugs.  Allergies:  Allergies  Allergen Reactions  . Codeine Itching    Medications Prior to Admission  Medication Sig Dispense Refill  . allopurinol (ZYLOPRIM) 300 MG tablet Take 300 mg by mouth daily.     Marland Kitchen ascorbic acid (VITAMIN C) 500 MG tablet Take 500 mg by mouth daily.    . ergocalciferol (VITAMIN D2) 50000 UNITS capsule Take 50,000 Units by mouth 2 (two)  times a week.    . Garlic 500 MG TABS Take 100 mg by mouth daily.    . Omega-3 Fatty Acids (FISH OIL) 1000 MG CAPS Take 1,000 mg by mouth daily.    Marland Kitchen acetaminophen (TYLENOL) 500 MG tablet Take 500 mg by mouth every 6 (six) hours as needed for mild pain or moderate pain.    . Rivaroxaban (XARELTO) 15 MG TABS tablet Take 15 mg by mouth every morning.    Marland Kitchen telmisartan-hydrochlorothiazide (MICARDIS HCT) 80-12.5 MG per tablet Take 1 tablet by mouth daily.       Results for orders placed or performed during the hospital encounter of 09/29/20 (from the past 48 hour(s))  SARS CORONAVIRUS 2 (TAT 6-24 HRS) Nasopharyngeal Nasopharyngeal Swab     Status: None   Collection Time: 09/29/20  9:53 AM   Specimen: Nasopharyngeal Swab  Result Value Ref Range   SARS Coronavirus 2 NEGATIVE NEGATIVE    Comment: (NOTE) SARS-CoV-2 target nucleic acids are NOT DETECTED.  The SARS-CoV-2 RNA is generally detectable in upper and lower respiratory specimens during the acute phase of infection. Negative results do not preclude SARS-CoV-2 infection, do not rule out co-infections with other pathogens, and should not be used as the sole basis for treatment or other patient management decisions. Negative results must be combined with clinical observations, patient history, and epidemiological information. The  expected result is Negative.  Fact Sheet for Patients: SugarRoll.be  Fact Sheet for Healthcare Providers: https://www.woods-mathews.com/  This test is not yet approved or cleared by the [place] FDA and  has been authorized for detection and/or diagnosis of SARS-CoV-2 by FDA under an Emergency Use Authorization (EUA). This EUA will remain  in effect (meaning this test can be used) for the duration of the COVID-19 declaration under Se ction 564(b)(1) of the Act, 21 U.S.C. section 360bbb-3(b)(1), unless the authorization is terminated or revoked sooner.  Performed  at [facility], [place] [address], [place], [place] [address]   Basic metabolic panel     Status: Abnormal   Collection Time: 09/29/20 11:27 AM  Result Value Ref Range   Sodium 137 135 - 145 mmol/L   Potassium 4.2 3.5 - 5.1 mmol/L   Chloride 103 98 - 111 mmol/L   CO2 27 22 - 32 mmol/L   Glucose, Bld 113 (H) 70 - 99 mg/dL    Comment: Glucose reference range applies only to samples taken after fasting for at least 8 hours.   BUN 19 8 - 23 mg/dL   Creatinine, Ser 1.24 0.61 - 1.24 mg/dL   Calcium 9.0 8.9 - 10.3 mg/dL   GFR, Estimated >60 >60 mL/min    Comment: (NOTE) Calculated using the CKD-EPI Creatinine Equation (2021)    Anion gap 7 5 - 15    Comment: Performed at [facility], [address] [place], [place] [address]   No results found.  Review of Systems  Constitutional: Negative.   HENT: Negative.   Eyes: Negative.   Respiratory: Negative.   Cardiovascular: Negative.   Gastrointestinal: Negative.   Endocrine: Negative.   Genitourinary: Negative.   Musculoskeletal: Negative.   Skin: Negative.   Allergic/Immunologic: Negative.   Neurological: Negative.   Hematological: Negative.   Psychiatric/Behavioral: Negative.     Blood pressure (!) 127/57, pulse (!) 55, temperature 97.7 F (36.5 C), temperature source Oral, resp. rate 14, height 6\' 3"  (1.905 m), weight 113.4 kg, SpO2 96 %. Physical Exam  GENERAL: The patient is AO x3, in no acute distress. HEENT: Head is normocephalic and atraumatic. EOMI are intact. Mouth is well hydrated and without lesions. NECK: Supple. No masses LUNGS: Clear to auscultation. No presence of rhonchi/wheezing/rales. Adequate chest expansion HEART: RRR, normal s1 and s2. ABDOMEN: Soft, nontender, no guarding, no peritoneal signs, and nondistended. BS +. No masses. EXTREMITIES: Without any cyanosis, clubbing, rash, lesions or edema. NEUROLOGIC: AOx3, no focal motor deficit. SKIN: no jaundice, no rashes  Assessment/Plan 69 year old  male with medical history of hypertension, pulmonary embolism, stroke, coming for surveillance of colon polyps..  We will proceed with colonoscopy today.  Harvel Quale, MD 10/01/2020, 7:32 AM

## 2020-10-01 NOTE — Anesthesia Postprocedure Evaluation (Signed)
Anesthesia Post Note  Patient: George Matthews  Procedure(s) Performed: COLONOSCOPY WITH PROPOFOL (N/A ) POLYPECTOMY  Patient location during evaluation: PACU Anesthesia Type: General Level of consciousness: awake and oriented Pain management: pain level controlled Vital Signs Assessment: post-procedure vital signs reviewed and stable Respiratory status: spontaneous breathing Cardiovascular status: stable Postop Assessment: no apparent nausea or vomiting Anesthetic complications: no   No complications documented.   Last Vitals:  Vitals:   10/01/20 0642  BP: (!) 127/57  Pulse: (!) 55  Resp: 14  Temp: 36.5 C  SpO2: 96%    Last Pain:  Vitals:   10/01/20 0642  TempSrc: Oral  PainSc: 0-No pain                 Karna Dupes

## 2020-10-01 NOTE — Discharge Instructions (Signed)
You are being discharged to home.  Resume your previous diet.  We are waiting for your pathology results.  Your physician has recommended a repeat colonoscopy for surveillance based on pathology results.  Can restart Xarelto today  PATIENT INSTRUCTIONS POST-ANESTHESIA  IMMEDIATELY FOLLOWING SURGERY:  Do not drive or operate machinery for the first twenty four hours after surgery.  Do not make any important decisions for twenty four hours after surgery or while taking narcotic pain medications or sedatives.  If you develop intractable nausea and vomiting or a severe headache please notify your doctor immediately.  FOLLOW-UP:  Please make an appointment with your surgeon as instructed. You do not need to follow up with anesthesia unless specifically instructed to do so.  WOUND CARE INSTRUCTIONS (if applicable):  Keep a dry clean dressing on the anesthesia/puncture wound site if there is drainage.  Once the wound has quit draining you may leave it open to air.  Generally you should leave the bandage intact for twenty four hours unless there is drainage.  If the epidural site drains for more than 36-48 hours please call the anesthesia department.  QUESTIONS?:  Please feel free to call your physician or the hospital operator if you have any questions, and they will be happy to assist you.     Colonoscopy, Adult, Care After This sheet gives you information about how to care for yourself after your procedure. Your doctor may also give you more specific instructions. If you have problems or questions, call your doctor. What can I expect after the procedure? After the procedure, it is common to have:  A small amount of blood in your poop (stool) for 24 hours.  Some gas.  Mild cramping or bloating in your belly (abdomen). Follow these instructions at home: Eating and drinking   Drink enough fluid to keep your pee (urine) pale yellow.  Follow instructions from your doctor about what you cannot  eat or drink.  Return to your normal diet as told by your doctor. Avoid heavy or fried foods that are hard to digest. Activity  Rest as told by your doctor.  Do not sit for a long time without moving. Get up to take short walks every 1-2 hours. This is important. Ask for help if you feel weak or unsteady.  Return to your normal activities as told by your doctor. Ask your doctor what activities are safe for you. To help cramping and bloating:   Try walking around.  Put heat on your belly as told by your doctor. Use the heat source that your doctor recommends, such as a moist heat pack or a heating pad. ? Put a towel between your skin and the heat source. ? Leave the heat on for 20-30 minutes. ? Remove the heat if your skin turns bright red. This is very important if you are unable to feel pain, heat, or cold. You may have a greater risk of getting burned. General instructions  For the first 24 hours after the procedure: ? Do not drive or use machinery. ? Do not sign important documents. ? Do not drink alcohol. ? Do your daily activities more slowly than normal. ? Eat foods that are soft and easy to digest.  Take over-the-counter or prescription medicines only as told by your doctor.  Keep all follow-up visits as told by your doctor. This is important. Contact a doctor if:  You have blood in your poop 2-3 days after the procedure. Get help right away if:  You have more than a small amount of blood in your poop.  You see large clumps of tissue (blood clots) in your poop.  Your belly is swollen.  You feel like you may vomit (nauseous).  You vomit.  You have a fever.  You have belly pain that gets worse, and medicine does not help your pain. Summary  After the procedure, it is common to have a small amount of blood in your poop. You may also have mild cramping and bloating in your belly.  For the first 24 hours after the procedure, do not drive or use machinery, do not  sign important documents, and do not drink alcohol.  Get help right away if you have a lot of blood in your poop, feel like you may vomit, have a fever, or have more belly pain. This information is not intended to replace advice given to you by your health care provider. Make sure you discuss any questions you have with your health care provider. Document Revised: 04/28/2019 Document Reviewed: 04/28/2019 Elsevier Patient Education  [place].   Colon Polyps  Polyps are tissue growths inside the body. Polyps can grow in many places, including the large intestine (colon). A polyp may be a round bump or a mushroom-shaped growth. You could have one polyp or several. Most colon polyps are noncancerous (benign). However, some colon polyps can become cancerous over time. Finding and removing the polyps early can help prevent this. What are the causes? The exact cause of colon polyps is not known. What increases the risk? You are more likely to develop this condition if you:  Have a family history of colon cancer or colon polyps.  Are older than 89 or older than 45 if you are African American.  Have inflammatory bowel disease, such as ulcerative colitis or Crohn's disease.  Have certain hereditary conditions, such as: ? Familial adenomatous polyposis. ? Lynch syndrome. ? Turcot syndrome. ? Peutz-Jeghers syndrome.  Are overweight.  Smoke cigarettes.  Do not get enough exercise.  Drink too much alcohol.  Eat a diet that is high in fat and red meat and low in fiber.  Had childhood cancer that was treated with abdominal radiation. What are the signs or symptoms? Most polyps do not cause symptoms. If you have symptoms, they may include:  Blood coming from your rectum when having a bowel movement.  Blood in your stool. The stool may look dark red or black.  Abdominal pain.  A change in bowel habits, such as constipation or diarrhea. How is this diagnosed? This condition  is diagnosed with a colonoscopy. This is a procedure in which a lighted, flexible scope is inserted into the anus and then passed into the colon to examine the area. Polyps are sometimes found when a colonoscopy is done as part of routine cancer screening tests. How is this treated? Treatment for this condition involves removing any polyps that are found. Most polyps can be removed during a colonoscopy. Those polyps will then be tested for cancer. Additional treatment may be needed depending on the results of testing. Follow these instructions at home: Lifestyle  Maintain a healthy weight, or lose weight if recommended by your health care provider.  Exercise every day or as told by your health care provider.  Do not use any products that contain nicotine or tobacco, such as cigarettes and e-cigarettes. If you need help quitting, ask your health care provider.  If you drink alcohol, limit how much you have: ?  0-1 drink a day for women. ? 0-2 drinks a day for men.  Be aware of how much alcohol is in your drink. In the [place] one drink equals one 12 oz bottle of beer (355 mL), one 5 oz glass of wine (148 mL), or one 1 oz shot of hard liquor (44 mL). Eating and drinking   Eat foods that are high in fiber, such as fruits, vegetables, and whole grains.  Eat foods that are high in calcium and vitamin D, such as milk, cheese, yogurt, eggs, liver, fish, and broccoli.  Limit foods that are high in fat, such as fried foods and desserts.  Limit the amount of red meat and processed meat you eat, such as hot dogs, sausage, bacon, and lunch meats. General instructions  Keep all follow-up visits as told by your health care provider. This is important. ? This includes having regularly scheduled colonoscopies. ? Talk to your health care provider about when you need a colonoscopy. Contact a health care provider if:  You have new or worsening bleeding during a bowel movement.  You have new or  increased blood in your stool.  You have a change in bowel habits.  You lose weight for no known reason. Summary  Polyps are tissue growths inside the body. Polyps can grow in many places, including the colon.  Most colon polyps are noncancerous (benign), but some can become cancerous over time.  This condition is diagnosed with a colonoscopy.  Treatment for this condition involves removing any polyps that are found. Most polyps can be removed during a colonoscopy. This information is not intended to replace advice given to you by your health care provider. Make sure you discuss any questions you have with your health care provider. Document Revised: 01/17/2018 Document Reviewed: 01/17/2018 Elsevier Patient Education  [place].   Hemorrhoids Hemorrhoids are swollen veins that may develop:  In the butt (rectum). These are called internal hemorrhoids.  Around the opening of the butt (anus). These are called external hemorrhoids. Hemorrhoids can cause pain, itching, or bleeding. Most of the time, they do not cause serious problems. They usually get better with diet changes, lifestyle changes, and other home treatments. What are the causes? This condition may be caused by:  Having trouble pooping (constipation).  Pushing hard (straining) to poop.  Watery poop (diarrhea).  Pregnancy.  Being very overweight (obese).  Sitting for long periods of time.  Heavy lifting or other activity that causes you to strain.  Anal sex.  Riding a bike for a long period of time. What are the signs or symptoms? Symptoms of this condition include:  Pain.  Itching or soreness in the butt.  Bleeding from the butt.  Leaking poop.  Swelling in the area.  One or more lumps around the opening of your butt. How is this diagnosed? A doctor can often diagnose this condition by looking at the affected area. The doctor may also:  Do an exam that involves feeling the area with a  gloved hand (digital rectal exam).  Examine the area inside your butt using a small tube (anoscope).  Order blood tests. This may be done if you have lost a lot of blood.  Have you get a test that involves looking inside the colon using a flexible tube with a camera on the end (sigmoidoscopy or colonoscopy). How is this treated? This condition can usually be treated at home. Your doctor may tell you to change what you eat, make lifestyle changes,  or try home treatments. If these do not help, procedures can be done to remove the hemorrhoids or make them smaller. These may involve:  Placing rubber bands at the base of the hemorrhoids to cut off their blood supply.  Injecting medicine into the hemorrhoids to shrink them.  Shining a type of light energy onto the hemorrhoids to cause them to fall off.  Doing surgery to remove the hemorrhoids or cut off their blood supply. Follow these instructions at home: Eating and drinking   Eat foods that have a lot of fiber in them. These include whole grains, beans, nuts, fruits, and vegetables.  Ask your doctor about taking products that have added fiber (fibersupplements).  Reduce the amount of fat in your diet. You can do this by: ? Eating low-fat dairy products. ? Eating less red meat. ? Avoiding processed foods.  Drink enough fluid to keep your pee (urine) pale yellow. Managing pain and swelling   Take a warm-water bath (sitz bath) for 20 minutes to ease pain. Do this 3-4 times a day. You may do this in a bathtub or using a portable sitz bath that fits over the toilet.  If told, put ice on the painful area. It may be helpful to use ice between your warm baths. ? Put ice in a plastic bag. ? Place a towel between your skin and the bag. ? Leave the ice on for 20 minutes, 2-3 times a day. General instructions  Take over-the-counter and prescription medicines only as told by your doctor. ? Medicated creams and medicines may be used as  told.  Exercise often. Ask your doctor how much and what kind of exercise is best for you.  Go to the bathroom when you have the urge to poop. Do not wait.  Avoid pushing too hard when you poop.  Keep your butt dry and clean. Use wet toilet paper or moist towelettes after pooping.  Do not sit on the toilet for a long time.  Keep all follow-up visits as told by your doctor. This is important. Contact a doctor if you:  Have pain and swelling that do not get better with treatment or medicine.  Have trouble pooping.  Cannot poop.  Have pain or swelling outside the area of the hemorrhoids. Get help right away if you have:  Bleeding that will not stop. Summary  Hemorrhoids are swollen veins in the butt or around the opening of the butt.  They can cause pain, itching, or bleeding.  Eat foods that have a lot of fiber in them. These include whole grains, beans, nuts, fruits, and vegetables.  Take a warm-water bath (sitz bath) for 20 minutes to ease pain. Do this 3-4 times a day. This information is not intended to replace advice given to you by your health care provider. Make sure you discuss any questions you have with your health care provider. Document Revised: 10/10/2018 Document Reviewed: 02/21/2018 Elsevier Patient Education  [place].

## 2020-10-01 NOTE — Op Note (Addendum)
[facility] Patient Name: George Matthews Procedure Date: 10/01/2020 7:11 AM MRN: [id number] Date of Birth: 06-Mar-1951 Attending MD: Maylon Peppers ,  CSN: [id number] Age: 69 Admit Type: Outpatient Procedure:                Colonoscopy Indications:              High risk colon cancer surveillance: Personal                            history of colonic polyps Providers:                Maylon Peppers, Caprice Kluver, Janeece Riggers, RN,                            Randa Spike, Technician Referring MD:              Medicines:                Monitored Anesthesia Care Complications:            No immediate complications. Estimated Blood Loss:     Estimated blood loss: none. Procedure:                Pre-Anesthesia Assessment:                           - Prior to the procedure, a History and Physical                            was performed, and patient medications, allergies                            and sensitivities were reviewed. The patient's                            tolerance of previous anesthesia was reviewed.                           - The risks and benefits of the procedure and the                            sedation options and risks were discussed with the                            patient. All questions were answered and informed                            consent was obtained.                           - ASA Grade Assessment: III - A patient with severe                            systemic disease.                           After obtaining informed consent, the colonoscope  was passed under direct vision. Throughout the                            procedure, the patient's blood pressure, pulse, and                            oxygen saturations were monitored continuously. The                            [id number]-[id number] ([id number]) was introduced through the                            anus and advanced to the the cecum, identified by                             appendiceal orifice and ileocecal valve. The                            colonoscopy was performed without difficulty. The                            patient tolerated the procedure well. The quality                            of the bowel preparation was good. Scope withdrawal                            time was 12 minutes. Scope In: 7:41:13 AM Scope Out: 8:04:34 AM Scope Withdrawal Time: 0 hours 14 minutes 36 seconds  Total Procedure Duration: 0 hours 23 minutes 21 seconds  Findings:      The perianal and digital rectal examinations were normal.      There were two medium-sized lipoma, 4 to 8 mm in diameter, in the       ascending colon.      Multiple small and large-mouthed diverticula were found in the sigmoid       colon, descending colon and ascending colon.      A 5 mm polyp was found in the sigmoid colon. The polyp was sessile. The       polyp was removed with a cold snare. Resection and retrieval were       complete.      Non-bleeding internal hemorrhoids were found during retroflexion. 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                           - Diverticulosis in the sigmoid colon, in the                            descending colon and in the ascending colon.                           - One 5 mm polyp in the sigmoid colon, removed with  a cold snare. Resected and retrieved.                           - Non-bleeding internal hemorrhoids. Moderate Sedation:      Per Anesthesia Care Recommendation:           - Discharge patient to home (ambulatory).                           - Resume previous diet.                           - Await pathology results.                           - Repeat colonoscopy for surveillance based on                            pathology results.                           - Can restart Xarelto today Procedure Code(s):        --- Professional ---                           [phone], GC,  Colonoscopy, flexible; with removal of                            tumor(s), polyp(s), or other lesion(s) by snare                            technique Diagnosis Code(s):        --- Professional ---                           Z86.010, Personal history of colonic polyps                           D17.5, Benign lipomatous neoplasm of                            intra-abdominal organs                           K64.8, Other hemorrhoids                           K63.5, Polyp of colon                           K57.30, Diverticulosis of large intestine without                            perforation or abscess without bleeding CPT copyright 2019 American Medical Association. All rights reserved. The codes documented in this report are preliminary and upon coder review may  be revised to meet current compliance requirements. Maylon Peppers, MD Maylon Peppers,  10/01/2020 8:10:08 AM This report has been signed electronically. Number of Addenda: 0

## 2020-10-01 NOTE — Transfer of Care (Signed)
Immediate Anesthesia Transfer of Care Note  Patient: George Matthews  Procedure(s) Performed: COLONOSCOPY WITH PROPOFOL (N/A ) POLYPECTOMY  Patient Location: PACU  Anesthesia Type:General  Level of Consciousness: awake and alert   Airway & Oxygen Therapy: Patient Spontanous Breathing  Post-op Assessment: Report given to RN and Post -op Vital signs reviewed and stable  Post vital signs: Reviewed and stable  Last Vitals:  Vitals Value Taken Time  BP 121/66 10/01/20 0808  Temp    Pulse 63 10/01/20 0809  Resp 18 10/01/20 0809  SpO2 100 % 10/01/20 0809  Vitals shown include unvalidated device data.  Last Pain:  Vitals:   10/01/20 0642  TempSrc: Oral  PainSc: 0-No pain      Patients Stated Pain Goal: 6 (97/98/92 1194)  Complications: No complications documented.

## 2020-10-01 NOTE — Anesthesia Preprocedure Evaluation (Signed)
Anesthesia Evaluation  Patient identified by MRN, date of birth, ID band Patient awake    Reviewed: Allergy & Precautions, H&P , NPO status , Patient's Chart, lab work & pertinent test results, reviewed documented beta blocker date and time   Airway Mallampati: II  TM Distance: >3 FB Neck ROM: full    Dental no notable dental hx.    Pulmonary neg pulmonary ROS, former smoker,    Pulmonary exam normal breath sounds clear to auscultation       Cardiovascular Exercise Tolerance: Good hypertension, negative cardio ROS   Rhythm:regular Rate:Normal     Neuro/Psych CVA, No Residual Symptoms negative psych ROS   GI/Hepatic negative GI ROS, Neg liver ROS,   Endo/Other  negative endocrine ROS  Renal/GU negative Renal ROS  negative genitourinary   Musculoskeletal   Abdominal   Peds  Hematology negative hematology ROS (+)   Anesthesia Other Findings   Reproductive/Obstetrics negative OB ROS                             Anesthesia Physical Anesthesia Plan  ASA: III  Anesthesia Plan: General   Post-op Pain Management:    Induction:   PONV Risk Score and Plan: Propofol infusion  Airway Management Planned:   Additional Equipment:   Intra-op Plan:   Post-operative Plan:   Informed Consent: I have reviewed the patients History and Physical, chart, labs and discussed the procedure including the risks, benefits and alternatives for the proposed anesthesia with the patient or authorized representative who has indicated his/her understanding and acceptance.     Dental Advisory Given  Plan Discussed with: CRNA  Anesthesia Plan Comments:         Anesthesia Quick Evaluation

## 2020-10-04 LAB — SURGICAL PATHOLOGY

## 2020-10-12 ENCOUNTER — Encounter (HOSPITAL_COMMUNITY): Payer: Self-pay | Admitting: Gastroenterology

## 2020-10-18 ENCOUNTER — Encounter (INDEPENDENT_AMBULATORY_CARE_PROVIDER_SITE_OTHER): Payer: Self-pay | Admitting: *Deleted

## 2020-12-26 ENCOUNTER — Emergency Department (HOSPITAL_COMMUNITY)
Admission: EM | Admit: 2020-12-26 | Discharge: 2020-12-26 | Disposition: A | Discharge status: Home / Self Care | Payer: Medicare HMO | Attending: Emergency Medicine | Admitting: Emergency Medicine

## 2020-12-26 ENCOUNTER — Other Ambulatory Visit: Payer: Self-pay

## 2020-12-26 ENCOUNTER — Emergency Department (HOSPITAL_COMMUNITY): Payer: Medicare HMO

## 2020-12-26 ENCOUNTER — Encounter (HOSPITAL_COMMUNITY): Payer: Self-pay

## 2020-12-26 DIAGNOSIS — Z87891 Personal history of nicotine dependence: Secondary | ICD-10-CM | POA: Diagnosis not present

## 2020-12-26 DIAGNOSIS — Z86718 Personal history of other venous thrombosis and embolism: Secondary | ICD-10-CM | POA: Insufficient documentation

## 2020-12-26 DIAGNOSIS — Z79899 Other long term (current) drug therapy: Secondary | ICD-10-CM | POA: Insufficient documentation

## 2020-12-26 DIAGNOSIS — I10 Essential (primary) hypertension: Secondary | ICD-10-CM | POA: Diagnosis not present

## 2020-12-26 DIAGNOSIS — Z86711 Personal history of pulmonary embolism: Secondary | ICD-10-CM | POA: Diagnosis not present

## 2020-12-26 DIAGNOSIS — Z8673 Personal history of transient ischemic attack (TIA), and cerebral infarction without residual deficits: Secondary | ICD-10-CM | POA: Diagnosis not present

## 2020-12-26 DIAGNOSIS — Z7901 Long term (current) use of anticoagulants: Secondary | ICD-10-CM | POA: Insufficient documentation

## 2020-12-26 DIAGNOSIS — R42 Dizziness and giddiness: Secondary | ICD-10-CM | POA: Diagnosis present

## 2020-12-26 LAB — CBC WITH DIFFERENTIAL/PLATELET
Abs Immature Granulocytes: 0.01 10*3/uL (ref 0.00–0.07)
Basophils Absolute: 0.1 10*3/uL (ref 0.0–0.1)
Basophils Relative: 1 %
Eosinophils Absolute: 0.3 10*3/uL (ref 0.0–0.5)
Eosinophils Relative: 5 %
HCT: 44.8 % (ref 39.0–52.0)
Hemoglobin: 13.9 g/dL (ref 13.0–17.0)
Immature Granulocytes: 0 %
Lymphocytes Relative: 32 %
Lymphs Abs: 1.8 10*3/uL (ref 0.7–4.0)
MCH: 29 pg (ref 26.0–34.0)
MCHC: 31 g/dL (ref 30.0–36.0)
MCV: 93.3 fL (ref 80.0–100.0)
Monocytes Absolute: 0.5 10*3/uL (ref 0.1–1.0)
Monocytes Relative: 8 %
Neutro Abs: 3.1 10*3/uL (ref 1.7–7.7)
Neutrophils Relative %: 54 %
Platelets: 336 10*3/uL (ref 150–400)
RBC: 4.8 MIL/uL (ref 4.22–5.81)
RDW: 15.9 % — ABNORMAL HIGH (ref 11.5–15.5)
WBC: 5.7 10*3/uL (ref 4.0–10.5)
nRBC: 0 % (ref 0.0–0.2)

## 2020-12-26 LAB — BASIC METABOLIC PANEL
Anion gap: 6 (ref 5–15)
BUN: 13 mg/dL (ref 8–23)
CO2: 27 mmol/L (ref 22–32)
Calcium: 8.8 mg/dL — ABNORMAL LOW (ref 8.9–10.3)
Chloride: 106 mmol/L (ref 98–111)
Creatinine, Ser: 1.1 mg/dL (ref 0.61–1.24)
GFR, Estimated: >60 mL/min (ref >60)
Glucose, Bld: 118 mg/dL — ABNORMAL HIGH (ref 70–99)
Potassium: 4 mmol/L (ref 3.5–5.1)
Sodium: 139 mmol/L (ref 135–145)

## 2020-12-26 MED ORDER — IRBESARTAN 150 MG PO TABS
300.0000 mg | ORAL_TABLET | Freq: Every day | ORAL | Status: DC
  Administered 2020-12-26: 300 mg via ORAL
  Filled 2020-12-26: qty 2

## 2020-12-26 MED ORDER — HYDROCHLOROTHIAZIDE 12.5 MG PO CAPS
12.5000 mg | ORAL_CAPSULE | Freq: Every day | ORAL | Status: DC
  Administered 2020-12-26: 12.5 mg via ORAL
  Filled 2020-12-26: qty 1

## 2020-12-26 MED ORDER — MECLIZINE HCL 25 MG PO TABS: 25.0000 mg | ORAL_TABLET | Freq: Three times a day (TID) | ORAL | 0 refills | Status: DC | PRN

## 2020-12-26 NOTE — Discharge Instructions (Addendum)
Your exam, labs and CT imaging tonight are reassuring.  There is no evidence that your symptoms are stroke related.  It is important for you to keep your blood pressure under good control, make sure you are not missing any doses of your blood pressure medications.  I have prescribed you meclizine which you may take if you have further symptoms.  Plan close follow-up with your primary doctor for recheck this week.

## 2020-12-26 NOTE — ED Triage Notes (Signed)
Pt arrives via POV from home c/o dizziness and slightly uneasy stomach which began this evening at apprx 1730. Pt denies emesis, diarrhea, recent infections. Pt denies LOC or falling.

## 2020-12-26 NOTE — ED Provider Notes (Signed)
[facility] EMERGENCY DEPARTMENT Provider Note   CSN: [id number] Arrival date & time: 12/26/20  1922     History Chief Complaint  Patient presents with  . Dizziness    George Matthews is a 70 y.o. 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  He also endorsed mild nausea without emesis.  The episode lasted for about 15 minutes and then resolved.  He is currently symptom-free.  He denies chest pain, shortness of breath and has had no focal weakness during or after this event.  He was seen here last summer with similar symptoms which were also transient and was placed on meclizine which he states was helpful.  He denies headache.  It is noted that his blood pressure is elevated upon presentation.  He states he did not take his blood pressure medication today.  HPI     Past Medical History:  Diagnosis Date  . DJD (degenerative joint disease)   . DVT (deep venous thrombosis) ([place]) 06/25/2014  . ED (erectile dysfunction)   . HTN (hypertension)   . Polio    right leg/arm weakness  . Pulmonary embolism ([place]) 06/25/2014  . Somatic dysfunction   . Stroke [facility])    affected vision and speech mildly  . Tubular adenoma of colon 2002    Patient Active Problem List   Diagnosis Date Noted  . Pulmonary nodule 07/20/2014  . Pulmonary embolism ([place]) 06/25/2014  . FH: colon cancer 08/20/2012  . Hx of adenomatous colonic polyps 08/20/2012    Past Surgical History:  Procedure Laterality Date  . COLONOSCOPY  2002   Dr. Laural Golden Single diverticulum at the splenic flexure, tiny polyp at transverse colon, tubular adenomatous polyp.  . COLONOSCOPY  08/23/2012   Procedure: COLONOSCOPY;  Surgeon: Danie Binder, MD;  Location: [facility] ENDO SUITE;  Service:  Endoscopy;  Laterality: N/A;  10:15  . COLONOSCOPY WITH PROPOFOL N/A 10/01/2020   Procedure: COLONOSCOPY WITH PROPOFOL;  Surgeon: Harvel Quale, MD;  Location: [facility] ENDO SUITE;  Service: Gastroenterology;  Laterality: N/A;  7:30 am  . POLYPECTOMY  10/01/2020   Procedure: POLYPECTOMY;  Surgeon: Harvel Quale, MD;  Location: [facility] ENDO SUITE;  Service: Gastroenterology;;       Family History  Problem Relation Age of Onset  . Colon cancer Father        age 77  . Heart attack Father        deceased age 74  . Liver disease Neg Hx     Social History   Tobacco Use  . Smoking status: Former Smoker    Quit date: 06/25/1972    Years since quitting: 48.5  . Smokeless tobacco: Current User    Types: Chew  Vaping Use  . Vaping Use: Never used  Substance Use Topics  . Alcohol use: No  . Drug use: No    Home Medications Prior to Admission medications   Medication Sig Start Date End Date Taking? Authorizing Provider  meclizine (ANTIVERT) 25 MG tablet Take 1 tablet (25 mg total) by mouth 3 (three) times daily as needed for dizziness. 12/26/20  Yes , Almyra Free, PA-C  acetaminophen (TYLENOL) 500 MG tablet Take 500 mg by mouth every 6 (six) hours as needed for mild pain or moderate pain.    [provider]  allopurinol (ZYLOPRIM) 300 MG tablet Take 300 mg by mouth daily.     [provider]  ascorbic acid (VITAMIN C) 500 MG tablet Take 500 mg by mouth daily.    [provider]  ergocalciferol (VITAMIN D2) 50000 UNITS capsule Take 50,000 Units by mouth 2 (two) times a week.    [provider]  Garlic 222 MG TABS Take 100 mg by mouth daily.    [provider]  Omega-3 Fatty Acids (FISH OIL) 1000 MG CAPS Take 1,000 mg by mouth daily.    [provider]  Rivaroxaban (XARELTO) 15 MG TABS tablet Take 15 mg by mouth every morning.    [provider]  telmisartan-hydrochlorothiazide (MICARDIS HCT) 80-12.5 MG per tablet  Take 1 tablet by mouth daily.     [provider]    Allergies    Codeine  Review of Systems   Review of Systems  Constitutional: Negative for chills and fever.  HENT: Negative for congestion and sore throat.   Eyes: Negative.   Respiratory: Negative for chest tightness and shortness of breath.   Cardiovascular: Negative for chest pain.  Gastrointestinal: Positive for nausea. Negative for abdominal pain and vomiting.  Genitourinary: Negative.   Musculoskeletal: Negative for arthralgias, gait problem, joint swelling and neck pain.  Skin: Negative.  Negative for rash and wound.  Neurological: Positive for dizziness. Negative for seizures, facial asymmetry, weakness, light-headedness, numbness and headaches.  Psychiatric/Behavioral: Negative.   All other systems reviewed and are negative.   Physical Exam Updated Vital Signs BP (!) 163/64   Pulse (!) 55   Temp 97.9 F (36.6 C) (Oral)   Resp 20   Ht 6\' 3"  (1.905 m)   Wt 113.9 kg   SpO2 96%   BMI 31.37 kg/m   Physical Exam Vitals and nursing note reviewed.  Constitutional:      General: He is not in acute distress.    Appearance: Normal appearance. He is well-developed.     Comments: Uncomfortable appearing  HENT:     Head: Normocephalic and atraumatic.     Right Ear: Tympanic membrane normal.     Left Ear: Tympanic membrane normal.     Mouth/Throat:     Mouth: Mucous membranes are moist.  Eyes:     Conjunctiva/sclera: Conjunctivae normal.     Pupils: Pupils are equal, round, and reactive to light.     Comments: No nystagmus  Cardiovascular:     Rate and Rhythm: Normal rate and regular rhythm.     Heart sounds: Normal heart sounds.  Pulmonary:     Effort: Pulmonary effort is normal.     Breath sounds: Normal breath sounds. No wheezing.  Musculoskeletal:        General: Normal range of motion.     Cervical back: Normal range of motion and neck supple.  Lymphadenopathy:     Cervical: No cervical  adenopathy.  Skin:    General: Skin is warm and dry.     Findings: No rash.  Neurological:     General: No focal deficit present.     Mental Status: He is alert and oriented to person, place, and time.     GCS: GCS eye subscore is 4. GCS verbal subscore is 5. GCS motor subscore is 6.     Cranial Nerves: No cranial nerve deficit.     Sensory: Sensation is intact. No sensory deficit.     Motor: Motor function is intact.  Gait: Gait normal.     Comments: Normal heel-shin, normal rapid alternating movements. Cranial nerves III-XII intact.  No pronator drift.  Unable to elicit patellar reflexes.  Patient states this is chronic due to his history of polio.  Psychiatric:        Speech: Speech normal.        Behavior: Behavior normal.        Thought Content: Thought content normal.     ED Results / Procedures / Treatments   Labs (all labs ordered are listed, but only abnormal results are displayed) Labs Reviewed  CBC WITH DIFFERENTIAL/PLATELET - Abnormal; Notable for the following components:      Result Value   RDW 15.9 (*)    All other components within normal limits  BASIC METABOLIC PANEL - Abnormal; Notable for the following components:   Glucose, Bld 118 (*)    Calcium 8.8 (*)    All other components within normal limits    EKG None  Radiology CT Head Wo Contrast  Result Date: 12/26/2020 CLINICAL DATA:  Dizziness, nausea EXAM: CT HEAD WITHOUT CONTRAST TECHNIQUE: Contiguous axial images were obtained from the base of the skull through the vertex without intravenous contrast. COMPARISON:  None. FINDINGS: Brain: No acute infarct or hemorrhage. Focal hypodensity right basal ganglia consistent with chronic lacunar infarct. Lateral ventricles and remaining midline structures are unremarkable. No acute extra-axial fluid collections. No mass effect. Vascular: Mild atherosclerosis of the internal carotid arteries. No hyperdense vessel. Skull: Normal. Negative for fracture or focal  lesion. Sinuses/Orbits: No acute finding. Other: None. IMPRESSION: 1. No acute intracranial process. Electronically Signed   By: Randa Ngo M.D.   On: 12/26/2020 21:39    Procedures Procedures   Medications Ordered in ED Medications  irbesartan (AVAPRO) tablet 300 mg (300 mg Oral Given 12/26/20 2007)  hydrochlorothiazide (MICROZIDE) capsule 12.5 mg (12.5 mg Oral Given 12/26/20 2007)    ED Course  I have reviewed the triage vital signs and the nursing notes.  Pertinent labs & imaging results that were available during my care of the patient were reviewed by me and considered in my medical decision making (see chart for details).    MDM Rules/Calculators/A&P                          Pt remains sx free during visit and at time of dc.  He was encouraged close f/u with pcp.  He was prescribed meclizine for prn use if his sx return. Strict return precautions also outlined.  Pt sx free, doubt central process as source of sx.  Doubt cva.  BP improved after meds given here.  Advised to avoid missing his bp medications. Final Clinical Impression(s) / ED Diagnoses Final diagnoses:  Dizziness  Primary hypertension    Rx / DC Orders ED Discharge Orders         Ordered    meclizine (ANTIVERT) 25 MG tablet  3 times daily PRN        12/26/20 2233           Evalee Jefferson, PA-C 12/26/20 2236    Truddie Hidden, MD 12/26/20 [phone]

## 2021-02-08 ENCOUNTER — Ambulatory Visit: Payer: Medicare HMO | Admitting: Pulmonary Disease

## 2021-02-08 ENCOUNTER — Telehealth: Payer: Self-pay | Admitting: Pharmacy Technician

## 2021-02-08 ENCOUNTER — Encounter: Payer: Self-pay | Admitting: Pulmonary Disease

## 2021-02-08 ENCOUNTER — Other Ambulatory Visit (HOSPITAL_COMMUNITY): Payer: Self-pay

## 2021-02-08 ENCOUNTER — Other Ambulatory Visit: Payer: Self-pay

## 2021-02-08 VITALS — BP 142/70 | HR 69 | Temp 98.1°F | Ht 75.5 in | Wt 259.0 lb

## 2021-02-08 DIAGNOSIS — Z7901 Long term (current) use of anticoagulants: Secondary | ICD-10-CM

## 2021-02-08 DIAGNOSIS — I2782 Chronic pulmonary embolism: Secondary | ICD-10-CM | POA: Diagnosis not present

## 2021-02-08 NOTE — Telephone Encounter (Signed)
-----   Message from Marshell Garfinkel, MD sent at 02/08/2021 10:47 AM EDT ----- Continue you please review his coverage for anticoagulation.  He says that Xarelto is too expensive.  Is Eliquis cheaper?  Thanks

## 2021-02-08 NOTE — Telephone Encounter (Signed)
Patient has Medicare and is likely in the donuthole. Ran test claim for 1 month of Eliquis, patient's copay is $524.83.  Best option for patient is to apply for patient assistance for either Xarelto or Eliquis.   Xarelto- JJPAF patient assistance- application link-   [email or web address]  Eliquis- BMS patient assistance- application link-   FlyerFunds.[email or web address].br.[email or web address]

## 2021-02-08 NOTE — Progress Notes (Signed)
George Matthews    [id number]    1951/09/12  Primary Care Physician:Dickey, Hessie Diener, NP  Referring Physician: Adaline Sill, NP [address] [place],  [place] [address]  Chief complaint: Consult for PE on chronic anticoagulation  HPI: 70 year old with history of hyperlipidemia.  He had an unprovoked PE/DVT with right heart strain in September 2015 requiring catheter directed thrombolysis.  Lower extremity ultrasound showed a mobile clot in the left lower extremity.  He had an IVC filter placed and a started on anticoagulation IVC filter subsequently removed later that year Hypercoagulable panel >>except for PTT Lupus Anticoauglant was + high 121.9   He is also noted to have 1.5 cm subpleural nodule at the left lung base which improved on subsequent CTs  Continues on Xarelto anticoagulation and is here to discuss alternatives as a course of medication is found to dollars per month  Pets: No pets Occupation: Used to work in R&D for a [employer] Exposures: No mold, hot tub, Customer service manager.  No feather pillows or comforters Smoking history: 10-pack-year smoker.  Quit in 1970s Travel history: No significant travel history Relevant family history: No family history of lung disease   Outpatient Encounter Medications as of 02/08/2021  Medication Sig  . acetaminophen (TYLENOL) 500 MG tablet Take 500 mg by mouth every 6 (six) hours as needed for mild pain or moderate pain.  Marland Kitchen allopurinol (ZYLOPRIM) 300 MG tablet Take 300 mg by mouth daily.   Marland Kitchen ascorbic acid (VITAMIN C) 500 MG tablet Take 500 mg by mouth daily.  . ergocalciferol (VITAMIN D2) 50000 UNITS capsule Take 50,000 Units by mouth 2 (two) times a week.  . Garlic 527 MG TABS Take 100 mg by mouth daily.  . meclizine (ANTIVERT) 25 MG tablet Take 1 tablet (25 mg total) by mouth 3 (three) times daily as needed for dizziness.  . Omega-3 Fatty Acids (FISH OIL) 1000 MG CAPS Take 1,000 mg by mouth daily.  . Rivaroxaban (XARELTO)  15 MG TABS tablet Take 15 mg by mouth every morning.  Marland Kitchen telmisartan-hydrochlorothiazide (MICARDIS HCT) 80-12.5 MG per tablet Take 1 tablet by mouth daily.    No facility-administered encounter medications on file as of 02/08/2021.    Allergies as of 02/08/2021 - Review Complete 12/26/2020  Allergen Reaction Noted  . Codeine Itching 09/22/2020    Past Medical History:  Diagnosis Date  . DJD (degenerative joint disease)   . DVT (deep venous thrombosis) ([place]) 06/25/2014  . ED (erectile dysfunction)   . HTN (hypertension)   . Polio    right leg/arm weakness  . Pulmonary embolism ([place]) 06/25/2014  . Somatic dysfunction   . Stroke [facility])    affected vision and speech mildly  . Tubular adenoma of colon 2002    Past Surgical History:  Procedure Laterality Date  . COLONOSCOPY  2002   Dr. Laural Golden Single diverticulum at the splenic flexure, tiny polyp at transverse colon, tubular adenomatous polyp.  . COLONOSCOPY  08/23/2012   Procedure: COLONOSCOPY;  Surgeon: Danie Binder, MD;  Location: [facility] ENDO SUITE;  Service: Endoscopy;  Laterality: N/A;  10:15  . COLONOSCOPY WITH PROPOFOL N/A 10/01/2020   Procedure: COLONOSCOPY WITH PROPOFOL;  Surgeon: Harvel Quale, MD;  Location: [facility] ENDO SUITE;  Service: Gastroenterology;  Laterality: N/A;  7:30 am  . POLYPECTOMY  10/01/2020   Procedure: POLYPECTOMY;  Surgeon: Harvel Quale, MD;  Location: [facility] ENDO SUITE;  Service: Gastroenterology;;  Family History  Problem Relation Age of Onset  . Colon cancer Father        age 43  . Heart attack Father        deceased age 17  . Liver disease Neg Hx     Social History   Socioeconomic History  . Marital status: Married    Spouse name: Not on file  . Number of children: 2  . Years of education: Not on file  . Highest education level: Not on file  Occupational History  . 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 Smoking status: Former Smoker    Quit date:  06/25/1972    Years since quitting: 48.6  . Smokeless tobacco: Current User    Types: Chew  Vaping Use  . Vaping Use: Never used  Substance and Sexual Activity  . Alcohol use: No  . Drug use: No  . Sexual activity: Not on file  Other Topics Concern  . Not on file  Social History Narrative  . Not on file   Social Determinants of Health   Financial Resource Strain: Not on file  Food Insecurity: Not on file  Transportation Needs: Not on file  Physical Activity: Not on file  Stress: Not on file  Social Connections: Not on file  Intimate Partner Violence: Not on file    Review of systems: Review of Systems  Constitutional: Negative for fever and chills.  HENT: Negative.   Eyes: Negative for blurred vision.  Respiratory: as per HPI  Cardiovascular: Negative for chest pain and palpitations.  Gastrointestinal: Negative for vomiting, diarrhea, blood per rectum. Genitourinary: Negative for dysuria, urgency, frequency and hematuria.  Musculoskeletal: Negative for myalgias, back pain and joint pain.  Skin: Negative for itching and rash.  Neurological: Negative for dizziness, tremors, focal weakness, seizures and loss of consciousness.  Endo/Heme/Allergies: Negative for environmental allergies.  Psychiatric/Behavioral: Negative for depression, suicidal ideas and hallucinations.  All other systems reviewed and are negative.  Physical Exam: Blood pressure (!) 142/70, pulse 69, temperature 98.1 F (36.7 C), temperature source Temporal, height 6' 3.5" (1.918 m), weight 259 lb (117.5 kg), SpO2 96 %. Gen:      No acute distress HEENT:  EOMI, sclera anicteric Neck:     No masses; no thyromegaly Lungs:    Clear to auscultation bilaterally; normal respiratory effort CV:         Regular rate and rhythm; no murmurs Abd:      + bowel sounds; soft, non-tender; no palpable masses, no distension Ext:    No edema; adequate peripheral perfusion Skin:      Warm and dry; no rash Neuro: alert and  oriented x 3 Psych: normal mood and affect  Data Reviewed: Imaging: CTA 06/25/2014- bilateral pulmonary embolism, RV/LV ratio 1.28, 1.5 cm subpleural nodule at left lung base.  I have reviewed the images personally.  CT chest 09/28/2014- progression of left lower lobe pulmonary nodule.  PFTs:  Labs:  Assessment:  Unprovoked pulmonary embolism, on chronic anticoagulation Review of his history shows unprovoked submassive PE in 2015.  He does have high lupus anticoagulant and risk for recurrence.  Per guidelines he needs to be on indefinite anticoagulation  Since Xarelto is expensive he will review with pharmacy to see if alternate medication such as Eliquis are cheaper.  I will also check with the pharmacy team here.  But I suspect that this do not hold any of the newer anticoagulants are going to be of similar expensive  to him  I have discussed changing to Coumadin and he has agreed to this.  We will make referral to the Coumadin clinic  Plan/Recommendations: Check with pharmacy on alternatives Referral to Coumadin clinic  Marshell Garfinkel MD [facility] 02/08/2021, 10:18 AM  CC: Adaline Sill, NP

## 2021-02-08 NOTE — Patient Instructions (Signed)
We will check with the pharmacy to see if there are any cheaper alternatives We will make referral to Coumadin clinic as I believe you need to continue your anticoagulation  Follow-up in 6 months

## 2021-02-11 NOTE — Telephone Encounter (Signed)
Please let the patient know that we are unable to refer him to cardiology Coumadin clinic as he does not have any cardiology issues  He will either need to apply for patient assistance with the information below or follow-up with his primary care to start Coumadin

## 2021-02-11 NOTE — Telephone Encounter (Signed)
Spoke with the pt and notified of response per Dr Vaughan Browner  He wanted assistance information for Xarelto and I provided this to him  Nothing further needed

## 2021-04-25 ENCOUNTER — Other Ambulatory Visit: Payer: Self-pay

## 2021-04-25 ENCOUNTER — Encounter (HOSPITAL_COMMUNITY): Payer: Self-pay | Admitting: *Deleted

## 2021-04-25 ENCOUNTER — Inpatient Hospital Stay (HOSPITAL_COMMUNITY)
Admission: EM | Admit: 2021-04-25 | Discharge: 2021-04-27 | DRG: 282 | Disposition: A | Discharge status: Home / Self Care | Payer: Medicare HMO | Attending: Internal Medicine | Admitting: Internal Medicine

## 2021-04-25 ENCOUNTER — Emergency Department (HOSPITAL_COMMUNITY): Payer: Medicare HMO

## 2021-04-25 DIAGNOSIS — Z7901 Long term (current) use of anticoagulants: Secondary | ICD-10-CM

## 2021-04-25 DIAGNOSIS — I214 Non-ST elevation (NSTEMI) myocardial infarction: Secondary | ICD-10-CM | POA: Diagnosis present

## 2021-04-25 DIAGNOSIS — R778 Other specified abnormalities of plasma proteins: Secondary | ICD-10-CM | POA: Diagnosis not present

## 2021-04-25 DIAGNOSIS — I25110 Atherosclerotic heart disease of native coronary artery with unstable angina pectoris: Secondary | ICD-10-CM | POA: Diagnosis present

## 2021-04-25 DIAGNOSIS — I10 Essential (primary) hypertension: Secondary | ICD-10-CM

## 2021-04-25 DIAGNOSIS — Z800 Family history of malignant neoplasm of digestive organs: Secondary | ICD-10-CM | POA: Diagnosis not present

## 2021-04-25 DIAGNOSIS — E785 Hyperlipidemia, unspecified: Secondary | ICD-10-CM

## 2021-04-25 DIAGNOSIS — Z885 Allergy status to narcotic agent status: Secondary | ICD-10-CM | POA: Diagnosis not present

## 2021-04-25 DIAGNOSIS — Z86711 Personal history of pulmonary embolism: Secondary | ICD-10-CM

## 2021-04-25 DIAGNOSIS — Z86718 Personal history of other venous thrombosis and embolism: Secondary | ICD-10-CM | POA: Diagnosis not present

## 2021-04-25 DIAGNOSIS — R079 Chest pain, unspecified: Secondary | ICD-10-CM | POA: Diagnosis not present

## 2021-04-25 DIAGNOSIS — Z86010 Personal history of colonic polyps: Secondary | ICD-10-CM | POA: Diagnosis not present

## 2021-04-25 DIAGNOSIS — Z20822 Contact with and (suspected) exposure to covid-19: Secondary | ICD-10-CM | POA: Diagnosis present

## 2021-04-25 DIAGNOSIS — Z87891 Personal history of nicotine dependence: Secondary | ICD-10-CM

## 2021-04-25 DIAGNOSIS — R7989 Other specified abnormal findings of blood chemistry: Secondary | ICD-10-CM

## 2021-04-25 DIAGNOSIS — E7800 Pure hypercholesterolemia, unspecified: Secondary | ICD-10-CM | POA: Diagnosis not present

## 2021-04-25 DIAGNOSIS — Z8249 Family history of ischemic heart disease and other diseases of the circulatory system: Secondary | ICD-10-CM | POA: Diagnosis not present

## 2021-04-25 DIAGNOSIS — Z8673 Personal history of transient ischemic attack (TIA), and cerebral infarction without residual deficits: Secondary | ICD-10-CM

## 2021-04-25 DIAGNOSIS — Z79899 Other long term (current) drug therapy: Secondary | ICD-10-CM | POA: Diagnosis not present

## 2021-04-25 DIAGNOSIS — I2510 Atherosclerotic heart disease of native coronary artery without angina pectoris: Secondary | ICD-10-CM | POA: Diagnosis not present

## 2021-04-25 DIAGNOSIS — I2583 Coronary atherosclerosis due to lipid rich plaque: Secondary | ICD-10-CM | POA: Diagnosis not present

## 2021-04-25 LAB — CBC
HCT: 40.8 % (ref 39.0–52.0)
Hemoglobin: 13 g/dL (ref 13.0–17.0)
MCH: 29.7 pg (ref 26.0–34.0)
MCHC: 31.9 g/dL (ref 30.0–36.0)
MCV: 93.4 fL (ref 80.0–100.0)
Platelets: 306 10*3/uL (ref 150–400)
RBC: 4.37 MIL/uL (ref 4.22–5.81)
RDW: 15.7 % — ABNORMAL HIGH (ref 11.5–15.5)
WBC: 6.7 10*3/uL (ref 4.0–10.5)
nRBC: 0 % (ref 0.0–0.2)

## 2021-04-25 LAB — BASIC METABOLIC PANEL
Anion gap: 8 (ref 5–15)
BUN: 16 mg/dL (ref 8–23)
CO2: 25 mmol/L (ref 22–32)
Calcium: 8.7 mg/dL — ABNORMAL LOW (ref 8.9–10.3)
Chloride: 106 mmol/L (ref 98–111)
Creatinine, Ser: 1.07 mg/dL (ref 0.61–1.24)
GFR, Estimated: >60 mL/min (ref >60)
Glucose, Bld: 109 mg/dL — ABNORMAL HIGH (ref 70–99)
Potassium: 4.2 mmol/L (ref 3.5–5.1)
Sodium: 139 mmol/L (ref 135–145)

## 2021-04-25 LAB — HEPARIN LEVEL (UNFRACTIONATED): Heparin Unfractionated: 0.93 IU/mL — ABNORMAL HIGH (ref 0.30–0.70)

## 2021-04-25 LAB — TROPONIN I (HIGH SENSITIVITY)
Troponin I (High Sensitivity): 1950 ng/L (ref <18)
Troponin I (High Sensitivity): 2861 ng/L (ref <18)

## 2021-04-25 LAB — APTT: aPTT: 26 seconds (ref 24–36)

## 2021-04-25 LAB — RESP PANEL BY RT-PCR (FLU A&B, COVID) ARPGX2
Influenza A by PCR: NEGATIVE
Influenza B by PCR: NEGATIVE
SARS Coronavirus 2 by RT PCR: NEGATIVE

## 2021-04-25 MED ORDER — NITROGLYCERIN 0.4 MG SL SUBL: 0.4000 mg | SUBLINGUAL_TABLET | SUBLINGUAL | Status: DC | PRN

## 2021-04-25 MED ORDER — ASPIRIN EC 81 MG PO TBEC
81.0000 mg | DELAYED_RELEASE_TABLET | Freq: Every day | ORAL | Status: DC
  Administered 2021-04-27: 81 mg via ORAL
  Filled 2021-04-25: qty 1

## 2021-04-25 MED ORDER — ALLOPURINOL 300 MG PO TABS
300.0000 mg | ORAL_TABLET | Freq: Every day | ORAL | Status: DC
  Administered 2021-04-26 – 2021-04-27 (×2): 300 mg via ORAL
  Filled 2021-04-25 (×2): qty 1

## 2021-04-25 MED ORDER — ATORVASTATIN CALCIUM 40 MG PO TABS
80.0000 mg | ORAL_TABLET | Freq: Once | ORAL | Status: AC
  Administered 2021-04-25: 80 mg via ORAL
  Filled 2021-04-25: qty 2

## 2021-04-25 MED ORDER — HEPARIN (PORCINE) 25000 UT/250ML-% IV SOLN
1400.0000 [IU]/h | INTRAVENOUS | Status: DC
  Administered 2021-04-25: 1400 [IU]/h via INTRAVENOUS
  Filled 2021-04-25: qty 250

## 2021-04-25 MED ORDER — ATORVASTATIN CALCIUM 80 MG PO TABS
80.0000 mg | ORAL_TABLET | Freq: Every day | ORAL | Status: DC
  Administered 2021-04-26 – 2021-04-27 (×2): 80 mg via ORAL
  Filled 2021-04-25 (×2): qty 1

## 2021-04-25 MED ORDER — ACETAMINOPHEN 325 MG PO TABS: 650.0000 mg | ORAL_TABLET | ORAL | Status: DC | PRN

## 2021-04-25 MED ORDER — ONDANSETRON HCL 4 MG/2ML IJ SOLN: 4.0000 mg | Freq: Four times a day (QID) | INTRAMUSCULAR | Status: DC | PRN

## 2021-04-25 MED ORDER — HEPARIN BOLUS VIA INFUSION
4000.0000 [IU] | Freq: Once | INTRAVENOUS | Status: AC
  Administered 2021-04-25: 4000 [IU] via INTRAVENOUS

## 2021-04-25 MED ORDER — ASPIRIN 325 MG PO TABS
325.0000 mg | ORAL_TABLET | Freq: Once | ORAL | Status: AC
  Administered 2021-04-25: 325 mg via ORAL
  Filled 2021-04-25: qty 1

## 2021-04-25 NOTE — ED Triage Notes (Signed)
Chest pain onset this am, pain has subsided during the day and felt he needed to come in for evaluation

## 2021-04-25 NOTE — Progress Notes (Signed)
ANTICOAGULATION CONSULT NOTE - Initial Consult  Pharmacy Consult for heparin Indication: chest pain/ACS  Allergies  Allergen Reactions   Codeine Itching    Patient Measurements: Weight: 120.2 kg (265 lb) Heparin Dosing Weight: 111 kg  Vital Signs: Temp: 97.8 F (36.6 C) (07/11 1804) Temp Source: Oral (07/11 1804) BP: 158/78 (07/11 2130) Pulse Rate: 67 (07/11 2130)  Labs: Recent Labs    04/25/21 1853 04/25/21 2009  HGB 13.0  --   HCT 40.8  --   PLT 306  --   CREATININE 1.07  --   TROPONINIHS 1,950* 2,861*    Estimated Creatinine Clearance: 87.8 mL/min (by C-G formula based on SCr of 1.07 mg/dL).   Medical History: Past Medical History:  Diagnosis Date   DJD (degenerative joint disease)    DVT (deep venous thrombosis) ([place]) 06/25/2014   ED (erectile dysfunction)    HTN (hypertension)    Polio    right leg/arm weakness   Pulmonary embolism ([place]) 06/25/2014   Somatic dysfunction    Stroke [facility])    affected vision and speech mildly   Tubular adenoma of colon 2002    Medications:  (Not in a hospital admission)   Assessment: Pharmacy consulted to dose heparin in patient with chest pain/ACS with elevated troponin.  Patient is on Xarelto prior to admission with last dose taken 7/8 @ 0830.  Heparin level and APTT pending.  Trop 2861 CBC WNL  Goal of Therapy:  Heparin level 0.3-0.7 units/ml aPTT 66-102 seconds Monitor platelets by anticoagulation protocol: Yes   Plan:  Give 4000 units bolus x 1 Start heparin infusion at 1400 units/hr Check anti-Xa level in 8 hours and daily while on heparin Continue to monitor H&H and platelets  Margot Ables, PharmD Clinical Pharmacist 04/25/2021 10:13 PM

## 2021-04-25 NOTE — ED Notes (Signed)
Date and time results received: 04/25/21 2100 (use smartphrase ".now" to insert current time)  Test: troponin Critical Value: 2861  Name of Provider Notified: triplett, pa  Orders Received? Or Actions Taken?: Actions Taken: no orders received

## 2021-04-25 NOTE — ED Provider Notes (Signed)
Emergency Medicine Provider Triage Evaluation Note  George Matthews , a 70 y.o. male  was evaluated in triage.  Pt complains of substernal chest pain at noon today.  Pain began after picking corn.  He describes a dull pain to his chest that was associated with nausea, 1 episode of vomiting, left upper arm pain and diaphoresis.  No history of heart disease or diabetes.  He states after rest, the pain subsided around 2:30 pm but he continues to have some milder discomfort.  No shortness of breath neck or jaw pain.  Review of Systems  Positive: Chest pain, nausea, vomiting, diaphoresis Negative: Shortness of breath, fever, chills, or cough  Physical Exam  BP (!) 135/59   Pulse (!) 50   Temp 97.8 F (36.6 C) (Oral)   Resp 20   SpO2 96%  Gen:   Awake, no distress   Resp:  Normal effort  MSK:   Moves extremities without difficulty  Other:  Chest pain  Medical Decision Making  Medically screening exam initiated at 6:43 PM.  Appropriate orders placed.  Posey Boyer was informed that the remainder of the evaluation will be completed by another provider, this initial triage assessment does not replace that evaluation, and the importance of remaining in the ED until their evaluation is complete.  Patient here for evaluation of chest pain earlier today.  Pain still present but improved since onset.  No history of cardiac disease.  He will need further evaluation emergency department, patient is agreeable to plan.   Kem Parkinson, PA-C 04/25/21 1846    Daleen Bo, MD 04/26/21 0030

## 2021-04-25 NOTE — ED Notes (Signed)
ED Provider at bedside. 

## 2021-04-25 NOTE — ED Provider Notes (Signed)
[facility] EMERGENCY DEPARTMENT Provider Note   CSN: [id number] Arrival date & time: 04/25/21  1720     History Chief Complaint  Patient presents with   Chest Pain    George Matthews is a 70 y.o. male.   Chest Pain Associated symptoms: diaphoresis, nausea and vomiting   Associated symptoms: no abdominal pain, no back pain, no cough, no dizziness, no dysphagia, no fatigue, no fever, no numbness, no palpitations, no shortness of breath and no weakness       George Matthews is a 70 y.o. male with past medical history of hypertension, stroke, and prior PE and DVT, chronically anticoagulated with Xarelto.  He presents to the Emergency Department complaining of mid chest pain, diaphoresis, left arm pain, nausea, and one episode of vomiting earlier today at noon.  He states that he was picking corn and sat down in the shade to shuck the corn when he felt pain to his mid upper chest.  He describes the pain as a discomfort and feeling like he needed to belch.  He noticed a sensation of heaviness of his left upper arm and reports having severe sweats.  He had 1 episode of vomiting during this time as well.  He went home and took a nap.  He states that around 2:30 he woke up and felt somewhat better but continued to have some discomfort of his upper chest.  He states he has never experienced pain like this before.  He denies any history of heart disease, recent illness, cough, and shortness of breath.     Past Medical History:  Diagnosis Date   DJD (degenerative joint disease)    DVT (deep venous thrombosis) ([place]) 06/25/2014   ED (erectile dysfunction)    HTN (hypertension)    Polio    right leg/arm weakness   Pulmonary embolism ([place]) 06/25/2014   Somatic dysfunction    Stroke [facility])    affected vision and speech mildly   Tubular adenoma of colon 2002    Patient Active Problem List   Diagnosis Date Noted   Current use of long term anticoagulation 02/08/2021   Pulmonary nodule  07/20/2014   Pulmonary embolism ([place]) 06/25/2014   FH: colon cancer 08/20/2012   Hx of adenomatous colonic polyps 08/20/2012    Past Surgical History:  Procedure Laterality Date   COLONOSCOPY  2002   Dr. Rehman--> Single diverticulum at the splenic flexure, tiny polyp at transverse colon, tubular adenomatous polyp.   COLONOSCOPY  08/23/2012   Procedure: COLONOSCOPY;  Surgeon: Danie Binder, MD;  Location: [facility] END[facility];  Service: Endoscopy;  Laterality: N/A;  10:15   COLONOSCOPY WITH PROPOFOL N/A 10/01/2020   Procedure: COLONOSCOPY WITH PROPOFOL;  Surgeon: Harvel Quale, MD;  Location: [facility] ENDO SUITE;  Service: Gastroenterology;  Laterality: N/A;  7:30 am   POLYPECTOMY  10/01/2020   Procedure: POLYPECTOMY;  Surgeon: Harvel Quale, MD;  Location: [facility] ENDO SUITE;  Service: Gastroenterology;;       Family History  Problem Relation Age of Onset   Colon cancer Father        age 52   Heart attack Father        deceased age 52   Liver disease Neg Hx     Social History   Tobacco Use   Smoking status: Former    Pack years: 0.00   Smokeless tobacco: Current    Types: Chew  Vaping Use   Vaping Use: Never used  Substance Use Topics  Alcohol use: No   Drug use: No    Home Medications Prior to Admission medications   Medication Sig Start Date End Date Taking? Authorizing Provider  acetaminophen (TYLENOL) 500 MG tablet Take 500 mg by mouth every 6 (six) hours as needed for mild pain or moderate pain.    [provider]  allopurinol (ZYLOPRIM) 300 MG tablet Take 300 mg by mouth daily.     [provider]  ascorbic acid (VITAMIN C) 500 MG tablet Take 500 mg by mouth daily.    [provider]  ergocalciferol (VITAMIN D2) 50000 UNITS capsule Take 50,000 Units by mouth 2 (two) times a week.    [provider]  Garlic 628 MG TABS Take 100 mg by mouth daily.    [provider]  meclizine (ANTIVERT) 25 MG tablet Take 1  tablet (25 mg total) by mouth 3 (three) times daily as needed for dizziness. 12/26/20   Evalee Jefferson, PA-C  Omega-3 Fatty Acids (FISH OIL) 1000 MG CAPS Take 1,000 mg by mouth daily.    [provider]  Rivaroxaban (XARELTO) 15 MG TABS tablet Take 15 mg by mouth every morning.    [provider]  telmisartan-hydrochlorothiazide (MICARDIS HCT) 80-12.5 MG per tablet Take 1 tablet by mouth daily.     [provider]    Allergies    Codeine  Review of Systems   Review of Systems  Constitutional:  Positive for diaphoresis. Negative for chills, fatigue and fever.  HENT:  Negative for sore throat and trouble swallowing.   Respiratory:  Negative for cough, shortness of breath and wheezing.   Cardiovascular:  Positive for chest pain. Negative for palpitations and leg swelling.  Gastrointestinal:  Positive for nausea and vomiting. Negative for abdominal pain and blood in stool.  Genitourinary:  Negative for dysuria, flank pain and hematuria.  Musculoskeletal:  Negative for arthralgias, back pain, myalgias, neck pain and neck stiffness.  Skin:  Negative for rash.  Neurological:  Negative for dizziness, syncope, weakness and numbness.  Hematological:  Does not bruise/bleed easily.   Physical Exam Updated Vital Signs BP 139/79   Pulse (!) 54   Temp 97.8 F (36.6 C) (Oral)   Resp 18   SpO2 97%   Physical Exam Vitals and nursing note reviewed.  Constitutional:      General: He is not in acute distress.    Appearance: He is well-developed. He is not ill-appearing.  HENT:     Mouth/Throat:     Mouth: Mucous membranes are moist.  Eyes:     Pupils: Pupils are equal, round, and reactive to light.  Cardiovascular:     Rate and Rhythm: Normal rate and regular rhythm.     Pulses: Normal pulses.  Pulmonary:     Effort: Pulmonary effort is normal.     Breath sounds: Normal breath sounds.  Abdominal:     Palpations: Abdomen is soft.     Tenderness: There is no abdominal  tenderness.  Musculoskeletal:        General: No swelling or tenderness. Normal range of motion.     Cervical back: Normal range of motion.  Skin:    General: Skin is warm and dry.     Capillary Refill: Capillary refill takes less than 2 seconds.     Findings: No rash.  Neurological:     General: No focal deficit present.     Mental Status: He is alert.     Sensory: No sensory deficit.  Motor: No weakness.    ED Results / Procedures / Treatments   Labs (all labs ordered are listed, but only abnormal results are displayed) Labs Reviewed  BASIC METABOLIC PANEL - Abnormal; Notable for the following components:      Result Value   Glucose, Bld 109 (*)    Calcium 8.7 (*)    All other components within normal limits  CBC - Abnormal; Notable for the following components:   RDW 15.7 (*)    All other components within normal limits  TROPONIN I (HIGH SENSITIVITY) - Abnormal; Notable for the following components:   Troponin I (High Sensitivity) 1,950 (*)    All other components within normal limits  TROPONIN I (HIGH SENSITIVITY) - Abnormal; Notable for the following components:   Troponin I (High Sensitivity) 2,861 (*)    All other components within normal limits    EKG EKG Interpretation  Date/Time:  Monday April 25 2021 18:08:06 EDT Ventricular Rate:  54 PR Interval:  172 QRS Duration: 98 QT Interval:  446 QTC Calculation: 422 R Axis:   9 Text Interpretation: Sinus bradycardia Possible Inferior infarct , age undetermined Abnormal ECG since last tracing no significant change Confirmed by Daleen Bo [phone]) on 04/25/2021 6:25:02 PM  Radiology DG Chest 2 View  Result Date: 04/25/2021 CLINICAL DATA:  Chest pain radiating to left arm EXAM: CHEST - 2 VIEW COMPARISON:  April 23, 2015 FINDINGS: The heart size and mediastinal contours are within normal limits. Aortic atherosclerosis. No focal consolidation. No pleural effusion. No pneumothorax. The visualized skeletal structures  are unremarkable. IMPRESSION: No active cardiopulmonary disease. Electronically Signed   By: Dahlia Bailiff MD   On: 04/25/2021 19:37    Procedures Procedures   Medications Ordered in ED Medications  heparin ADULT infusion 100 units/mL (25000 units/282mL) (1,400 Units/hr Intravenous New Bag/Given 04/25/21 2221)  aspirin tablet 325 mg (325 mg Oral Given 04/25/21 2125)  heparin bolus via infusion 4,000 Units (4,000 Units Intravenous Bolus from Bag 04/25/21 2221)    ED Course  I have reviewed the triage vital signs and the nursing notes.  Pertinent labs & imaging results that were available during my care of the patient were reviewed by me and considered in my medical decision making (see chart for details).    MDM Rules/Calculators/A&P                          Patient here with history concerning for heart disease.  He developed chest pain at noon today that improved slightly but did not resolve.  No history of prior heart disease.  He is anticoagulated on Xarelto for PE/DVT.  EKG without acute ischemic change, chest x-ray without acute finding.  Initial troponin elevated at 1950, electrolytes and CBC unremarkable.  Consulted with cardiology, Dr. Clayton Bibles who recommends direct admit to [facility] telemetry under his service.  Recommends heparin drip.  Patient is anticoagulated on Xarelto, but has not taken his dose today.  I have consulted [facility] pharmacist regarding heparin dosing. Pt agreeable to plan and all questions answered.     Final Clinical Impression(s) / ED Diagnoses Final diagnoses:  Chest pain with high risk of acute coronary syndrome    Rx / DC Orders ED Discharge Orders     None        Kem Parkinson, PA-C 04/25/21 2252    Daleen Bo, MD 04/26/21 0030

## 2021-04-25 NOTE — H&P (Addendum)
Cardiology Admission History and Physical:   Patient ID: George Matthews MRN: [id number]; DOB: [birth year]   Admission date: 04/25/2021  PCP:  Adaline Sill, NP   [facility] Providers Cardiologist:  New to [facility]   Chief Complaint: NSTEMI  Patient Profile:   George Matthews is a 71 y.o. 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  History of Present Illness:   George Matthews presented to [facility] ED on 04/25/2021 for evaluation of chest pain, left arm pain and nausea which had started earlier in the day. In talking with the patient today, he reports being in his usual state of health until yesterday morning. 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 Went back home and took a 30-minute nap with improvement in his symptoms. He accompanied his wife to an appointment yesterday afternoon and while there developed chest pressure, nausea, vomiting and discomfort along his left arm which prompted him to come to the ED. He is unaware of any personal history of CAD, CHF or cardiac arrhythmias. Reports he is usually compliant with his medications but forgot to take his Xarelto over the weekend and his last dose was Thursday.   Initial labs show WBC 6.7, Hgb 13.0, platelets 306, Na+ 139, K+ 4.2 and creatinine 1.07. COVID negative. Initial Hs Tropinin 1950 with repeat values of 2861 and 6462. Hgb A1c pending. FLP shows total cholesterol of 158, triglycerides 33, HDL 38 and LDL 113. BNP 128. CXR with no active cardiopulmonary disease. EKG shows sinus bradycardia, HR 54 with inferior infarct pattern and slight ST elevation along V2.   He has been started on Heparin along with ASA and statin therapy. Denies any current pain this AM. Says he is very hungry as he has not  ate since yesterday afternoon.    Past Medical History:  Diagnosis Date   DJD (degenerative joint disease)    DVT (deep venous thrombosis) ([place]) 06/25/2014   ED (erectile dysfunction)    HTN (hypertension)    Polio    right leg/arm weakness   Pulmonary embolism ([place]) 06/25/2014   Somatic dysfunction    Stroke [facility])    affected vision and speech mildly   Tubular adenoma of colon 2002    Past Surgical History:  Procedure Laterality Date   COLONOSCOPY  2002   Dr. Rehman--> Single diverticulum at the splenic flexure, tiny polyp at transverse colon, tubular adenomatous polyp.   COLONOSCOPY  08/23/2012   Procedure: COLONOSCOPY;  Surgeon: Danie Binder, MD;  Location[facility];  Service: Endoscopy;  Laterality: N/A;  10:15   COLONOSCOPY WITH PROPOFOL N/A 10/01/2020   Procedure: COLONOSCOPY WITH PROPOFOL;  Surgeon: Harvel Quale, MD;  Location[facility];  Service: Gastroenterology;  Laterality: N/A;  7:30 am   POLYPECTOMY  10/01/2020   Procedure: POLYPECTOMY;  Surgeon: Harvel Quale, MD;  Location: [facility];  Service: Gastroenterology;;     Medications Prior to Admission: Prior to Admission medications   Medication Sig Start Date End Date Taking? Authorizing Provider  acetaminophen (TYLENOL) 500 MG tablet Take 500 mg by mouth every 6 (six) hours as needed for mild pain or moderate pain.   Yes [provider]  allopurinol (ZYLOPRIM) 300 MG tablet Take 300 mg by mouth daily.  Yes [provider]  ascorbic acid (VITAMIN C) 500 MG tablet Take 500 mg by mouth daily.   Yes [provider]  ergocalciferol (VITAMIN D2) 50000 UNITS capsule Take 50,000 Units by mouth 2 (two) times a week.   Yes [provider]  Garlic 578 MG TABS Take 100 mg by mouth daily.   Yes [provider]  meclizine (ANTIVERT) 25 MG tablet Take 1 tablet (25 mg total) by mouth 3 (three) times daily as needed for dizziness. 12/26/20  Yes  Idol, Almyra Free, PA-C  Omega-3 Fatty Acids (FISH OIL) 1000 MG CAPS Take 1,000 mg by mouth daily.   Yes [provider]  telmisartan-hydrochlorothiazide (MICARDIS HCT) 80-12.5 MG per tablet Take 1 tablet by mouth daily.    Yes [provider]  Rivaroxaban (XARELTO) 15 MG TABS tablet Take 15 mg by mouth every morning.    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  Tobacco Use   Smoking status: Former    Pack years: 0.00   Smokeless tobacco: Current    Types: Nurse, children's Use: Never used  Substance and Sexual Activity   Alcohol use: No   Drug use: No   Sexual activity: Not on file  Other Topics Concern   Not on file  Social History Narrative   Not on file   Social Determinants of Health   Financial Resource Strain: Not on file  Food Insecurity: Not on file  Transportation Needs: Not on file  Physical Activity: Not on file  Stress: Not on file  Social Connections: Not on file  Intimate Partner Violence: Not on file    Family History:   The patient's family history includes Colon cancer in his father; Heart attack in his father. There is no history of Liver disease.    ROS:  Please see the history of present illness.   All other ROS reviewed and negative.     Physical Exam/Data:   Vitals:   04/26/21 0200 04/26/21 0330 04/26/21 0500 04/26/21 0600  BP: 132/67 121/62 (!) 121/54 (!) 112/52  Pulse: (!) 54 64 60 64  Resp: 14 14 14 18   Temp:      TempSrc:      SpO2: 98% 97% 98% 100%  Weight:      Height:       No intake or output data in the 24 hours ending 04/26/21 0853 Last 3 Weights 04/25/2021 02/08/2021 12/26/2020   Weight (lbs) 265 lb 259 lb 251 lb  Weight (kg) 120.203 kg 117.482 kg 113.853 kg     Body mass index is 32.68 kg/m.  General: Pleasant male appearing in no acute distress HEENT: normal Lymph: no adenopathy Neck: no JVD Endocrine:  No thryomegaly Vascular: No carotid bruits; FA pulses 2+ bilaterally without bruits  Cardiac:  normal S1, S2; Regular rhythm, bradycardiac rate; no murmur  Lungs:  clear to auscultation bilaterally, no wheezing, rhonchi or rales  Abd: soft, nontender, appears mildly distended.  Ext: no pitting edema Musculoskeletal:  No deformities, BUE and BLE strength normal and equal Skin: warm  and dry  Neuro:  CNs 2-12 intact, no focal abnormalities noted Psych:  Normal affect   EKG:  The ECG that was done 04/25/21 was personally reviewed and demonstrates sinus bradycardia, HR 54 with inferior infarct pattern and slight ST elevation along V2.   Relevant CV Studies:  TTE (06/2014): - Left ventricle: The cavity size was normal. Systolic function was    normal. The estimated ejection fraction was in the range of 55%    to 60%. Wall motion was normal; there were no regional wall    motion abnormalities.  - Left atrium: The atrium was mildly dilated.  - Right ventricle: The cavity size was mildly dilated. Wall    thickness was normal. Systolic function was mildly reduced.  - Right atrium: The atrium was mildly dilated.  Laboratory Data:  High Sensitivity Troponin:   Recent Labs  Lab 04/25/21 1853 04/25/21 2009 04/26/21 0012  TROPONINIHS 1,950* 2,861* 6,462*      Chemistry Recent Labs  Lab 04/25/21 1853 04/26/21 0544  NA 139 138  K 4.2 3.6  CL 106 107  CO2 25 24  GLUCOSE 109* 106*  BUN 16 14  CREATININE 1.07 0.96  CALCIUM 8.7* 8.6*  GFRNONAA >60 >60  ANIONGAP 8 7    No results for input(s): PROT, ALBUMIN, AST, ALT, ALKPHOS, BILITOT in the last 168 hours. Hematology Recent Labs  Lab 04/25/21 1853 04/26/21 0544  WBC 6.7 6.7  RBC 4.37 4.27  HGB  13.0 12.6*  HCT 40.8 38.3*  MCV 93.4 89.7  MCH 29.7 29.5  MCHC 31.9 32.9  RDW 15.7* 15.4  PLT 306 267   BNP Recent Labs  Lab 04/26/21 0022  BNP 128.0*    DDimer No results for input(s): DDIMER in the last 168 hours.   Radiology/Studies:  DG Chest 2 View  Result Date: 04/25/2021 CLINICAL DATA:  Chest pain radiating to left arm EXAM: CHEST - 2 VIEW COMPARISON:  April 23, 2015 FINDINGS: The heart size and mediastinal contours are within normal limits. Aortic atherosclerosis. No focal consolidation. No pleural effusion. No pneumothorax. The visualized skeletal structures are unremarkable. IMPRESSION: No active cardiopulmonary disease. Electronically Signed   By: Dahlia Bailiff MD   On: 04/25/2021 19:37     Assessment and Plan:   1. NSTEMI - He presents with new-onset chest pressure, dyspnea, diaphoresis, nausea and vomiting which started yesterday afternoon and found to have an NSTEMI with Hs Troponin values up to 6462 on most recent check. Hgb A1c pending. FLP shows LDL at 113. EKG shows sinus bradycardia, HR 54 with inferior infarct pattern and slight ST elevation along V2. - The patient has already been accepted to [facility] for transfer but is awaiting a bed. Reviewed plans for transfer and a cardiac catheterization with the patient and his spouse at the bedside. He in in agreement to proceed. The patient understands that risks include but are not limited to stroke (1 in 1000), death (1 in 29), kidney failure [usually temporary] (1 in 500), bleeding (1 in 200), allergic reaction [possibly serious] (1 in 200). Keep NPO for now except for clear liquids.  - Continue Heparin, ASA 81mg  daily and Atorvastatin 80mg  daily. Restart ARB following cath. No BB for now given his HR in the high-40's to 50's.   2. HTN - He was on Telmisartan-HCTZ prior to admission which has been held in anticipation of this catheterization. BP stable at 112/52 on most recent check.   3. History of PE/DVT - He  was on Xarelto prior to admission (last dose 04/21/2021). Now held and currently on Heparin.    Risk Assessment/Risk Scores:  TIMI Risk Score for Unstable Angina or Non-ST Elevation MI:   The patient's TIMI risk score is 3, which indicates a 13% risk of all cause mortality, new or recurrent myocardial infarction or need for urgent revascularization in the next 14 days.{  Severity of Illness: The appropriate patient status for this patient is OBSERVATION. Observation status is judged to be reasonable and necessary in order to provide the required intensity of service to ensure the patient's safety. The patient's presenting symptoms, physical exam findings, and initial radiographic and laboratory data in the context of their medical condition is felt to place them at decreased risk for further clinical deterioration. Furthermore, it is anticipated that the patient will be medically stable for discharge from the hospital within 2 midnights of admission. The following factors support the patient status of observation.   " The patient's presenting symptoms include chest pain. " The physical exam findings include N/A. " The initial radiographic and laboratory data are concerning for elevated troponin values.   For questions or updates, please contact [place] Please consult [email or web address] for contact info under    Signed, Erma Heritage, PA-C  04/26/2021 8:53 AM

## 2021-04-25 NOTE — ED Provider Notes (Signed)
  Face-to-face evaluation   History: He presents for evaluation of an episode of chest discomfort which was "not pain."  This occurred after he was picking corn, in the field.  He developed some nausea and diaphoresis which persisted for couple of hours.  He had to stop picking corn because of that.  He went home, and felt better, and then laid down for a while.  He was able to eat without difficulty.  Later as he was with his wife, taking her to her doctor's appointment he had transient recurrence of the symptoms.  They again abated, but he decided come here and get it checked at that time.  No prior cardiac history.  He is not a smoker.  He denies cough, headache, neck pain or back pain.  Physical exam: Alert, calm, cooperative.  Elderly male.  No dysarthria or aphasia.  No respiratory distress.  Normal range of motion arms and legs bilaterally.    EKG Interpretation  Date/Time:  Monday April 25 2021 18:08:06 EDT Ventricular Rate:  54 PR Interval:  172 QRS Duration: 98 QT Interval:  446 QTC Calculation: 422 R Axis:   9 Text Interpretation: Sinus bradycardia Possible Inferior infarct , age undetermined Abnormal ECG since last tracing no significant change Confirmed by Daleen Bo [phone]) on 04/25/2021 6:25:02 PM          Medical screening examination/treatment/procedure(s) were conducted as a shared visit with non-physician practitioner(s) and myself.  I personally evaluated the patient during the encounter    Daleen Bo, MD 04/26/21 0030

## 2021-04-25 NOTE — ED Notes (Signed)
Date and time results received: 04/25/21 2020 (use smartphrase ".now" to insert current time)  Test: troponin Critical Value: 1950  Name of Provider Notified: Vanessa Esmeralda, [place]  Orders Received? Or Actions Taken?: Actions Taken: no orders received

## 2021-04-26 ENCOUNTER — Inpatient Hospital Stay (HOSPITAL_COMMUNITY): Payer: Medicare HMO

## 2021-04-26 ENCOUNTER — Inpatient Hospital Stay (HOSPITAL_COMMUNITY)
Admission: EM | Disposition: A | Discharge status: Home / Self Care | Payer: Self-pay | Source: Home / Self Care | Attending: Internal Medicine

## 2021-04-26 ENCOUNTER — Other Ambulatory Visit: Payer: Self-pay

## 2021-04-26 DIAGNOSIS — I10 Essential (primary) hypertension: Secondary | ICD-10-CM

## 2021-04-26 DIAGNOSIS — I214 Non-ST elevation (NSTEMI) myocardial infarction: Secondary | ICD-10-CM

## 2021-04-26 DIAGNOSIS — I2510 Atherosclerotic heart disease of native coronary artery without angina pectoris: Secondary | ICD-10-CM

## 2021-04-26 HISTORY — PX: LEFT HEART CATH AND CORONARY ANGIOGRAPHY: CATH118249

## 2021-04-26 LAB — HEPARIN LEVEL (UNFRACTIONATED): Heparin Unfractionated: 0.62 IU/mL (ref 0.30–0.70)

## 2021-04-26 LAB — CBC
HCT: 38.3 % — ABNORMAL LOW (ref 39.0–52.0)
Hemoglobin: 12.6 g/dL — ABNORMAL LOW (ref 13.0–17.0)
MCH: 29.5 pg (ref 26.0–34.0)
MCHC: 32.9 g/dL (ref 30.0–36.0)
MCV: 89.7 fL (ref 80.0–100.0)
Platelets: 267 10*3/uL (ref 150–400)
RBC: 4.27 MIL/uL (ref 4.22–5.81)
RDW: 15.4 % (ref 11.5–15.5)
WBC: 6.7 10*3/uL (ref 4.0–10.5)
nRBC: 0 % (ref 0.0–0.2)

## 2021-04-26 LAB — ECHOCARDIOGRAM COMPLETE
AR max vel: 3.06 cm2
AV Area VTI: 2.75 cm2
AV Area mean vel: 2.79 cm2
AV Mean grad: 2 mmHg
AV Peak grad: 4.8 mmHg
Ao pk vel: 1.09 m/s
Area-P 1/2: 3.76 cm2
Calc EF: 45.6 %
Height: 75.512 in
MV VTI: 2.45 cm2
S' Lateral: 3.09 cm
Single Plane A2C EF: 43 %
Single Plane A4C EF: 44.6 %
Weight: 4240 oz

## 2021-04-26 LAB — HEMOGLOBIN A1C
Hgb A1c MFr Bld: 6.3 % — ABNORMAL HIGH (ref 4.8–5.6)
Mean Plasma Glucose: 134.11 mg/dL

## 2021-04-26 LAB — APTT: aPTT: 100 seconds — ABNORMAL HIGH (ref 24–36)

## 2021-04-26 LAB — BASIC METABOLIC PANEL
Anion gap: 7 (ref 5–15)
BUN: 14 mg/dL (ref 8–23)
CO2: 24 mmol/L (ref 22–32)
Calcium: 8.6 mg/dL — ABNORMAL LOW (ref 8.9–10.3)
Chloride: 107 mmol/L (ref 98–111)
Creatinine, Ser: 0.96 mg/dL (ref 0.61–1.24)
GFR, Estimated: >60 mL/min (ref >60)
Glucose, Bld: 106 mg/dL — ABNORMAL HIGH (ref 70–99)
Potassium: 3.6 mmol/L (ref 3.5–5.1)
Sodium: 138 mmol/L (ref 135–145)

## 2021-04-26 LAB — LIPID PANEL
Cholesterol: 158 mg/dL (ref 0–200)
HDL: 38 mg/dL — ABNORMAL LOW (ref >40)
LDL Cholesterol: 113 mg/dL — ABNORMAL HIGH (ref 0–99)
Total CHOL/HDL Ratio: 4.2 RATIO
Triglycerides: 33 mg/dL (ref <150)
VLDL: 7 mg/dL (ref 0–40)

## 2021-04-26 LAB — T4, FREE: Free T4: 0.85 ng/dL (ref 0.61–1.12)

## 2021-04-26 LAB — BRAIN NATRIURETIC PEPTIDE: B Natriuretic Peptide: 128 pg/mL — ABNORMAL HIGH (ref 0.0–100.0)

## 2021-04-26 LAB — TSH: TSH: 1.286 u[IU]/mL (ref 0.350–4.500)

## 2021-04-26 LAB — TROPONIN I (HIGH SENSITIVITY): Troponin I (High Sensitivity): 6462 ng/L (ref <18)

## 2021-04-26 SURGERY — LEFT HEART CATH AND CORONARY ANGIOGRAPHY
Anesthesia: LOCAL

## 2021-04-26 MED ORDER — SODIUM CHLORIDE 0.9% FLUSH
3.0000 mL | Freq: Two times a day (BID) | INTRAVENOUS | Status: DC
  Administered 2021-04-26 – 2021-04-27 (×2): 3 mL via INTRAVENOUS

## 2021-04-26 MED ORDER — HEPARIN (PORCINE) IN NACL 1000-0.9 UT/500ML-% IV SOLN
INTRAVENOUS | Status: DC | PRN
  Administered 2021-04-26 (×2): 500 mL

## 2021-04-26 MED ORDER — ASPIRIN 81 MG PO CHEW
CHEWABLE_TABLET | ORAL | Status: DC | PRN
  Administered 2021-04-26: 81 mg via ORAL

## 2021-04-26 MED ORDER — VERAPAMIL HCL 2.5 MG/ML IV SOLN
INTRAVENOUS | Status: DC | PRN
  Administered 2021-04-26: 10 mL via INTRA_ARTERIAL

## 2021-04-26 MED ORDER — SODIUM CHLORIDE 0.9 % IV SOLN: INTRAVENOUS | Status: DC

## 2021-04-26 MED ORDER — TICAGRELOR 90 MG PO TABS
ORAL_TABLET | ORAL | Status: AC
  Filled 2021-04-26: qty 2

## 2021-04-26 MED ORDER — LIDOCAINE HCL (PF) 1 % IJ SOLN
INTRAMUSCULAR | Status: AC
  Filled 2021-04-26: qty 30

## 2021-04-26 MED ORDER — VERAPAMIL HCL 2.5 MG/ML IV SOLN
INTRAVENOUS | Status: AC
  Filled 2021-04-26: qty 2

## 2021-04-26 MED ORDER — SODIUM CHLORIDE 0.9 % IV SOLN: 250.0000 mL | INTRAVENOUS | Status: DC | PRN

## 2021-04-26 MED ORDER — MIDAZOLAM HCL 2 MG/2ML IJ SOLN
INTRAMUSCULAR | Status: AC
  Filled 2021-04-26: qty 2

## 2021-04-26 MED ORDER — SODIUM CHLORIDE 0.9 % IV SOLN
INTRAVENOUS | Status: AC | PRN
  Administered 2021-04-26: 120 mL/h via INTRAVENOUS

## 2021-04-26 MED ORDER — FENTANYL CITRATE (PF) 100 MCG/2ML IJ SOLN
INTRAMUSCULAR | Status: AC
  Filled 2021-04-26: qty 2

## 2021-04-26 MED ORDER — MIDAZOLAM HCL 2 MG/2ML IJ SOLN
INTRAMUSCULAR | Status: DC | PRN
  Administered 2021-04-26: 1 mg via INTRAVENOUS

## 2021-04-26 MED ORDER — IOHEXOL 350 MG/ML SOLN
INTRAVENOUS | Status: DC | PRN
  Administered 2021-04-26: 50 mL

## 2021-04-26 MED ORDER — HEPARIN SODIUM (PORCINE) 1000 UNIT/ML IJ SOLN
INTRAMUSCULAR | Status: AC
  Filled 2021-04-26: qty 1

## 2021-04-26 MED ORDER — FENTANYL CITRATE (PF) 100 MCG/2ML IJ SOLN
INTRAMUSCULAR | Status: DC | PRN
  Administered 2021-04-26: 25 ug via INTRAVENOUS

## 2021-04-26 MED ORDER — RIVAROXABAN 15 MG PO TABS: 15.0000 mg | ORAL_TABLET | Freq: Every day | ORAL | Status: DC

## 2021-04-26 MED ORDER — HEPARIN (PORCINE) IN NACL 1000-0.9 UT/500ML-% IV SOLN
INTRAVENOUS | Status: AC
  Filled 2021-04-26: qty 1000

## 2021-04-26 MED ORDER — ASPIRIN 81 MG PO CHEW
CHEWABLE_TABLET | ORAL | Status: AC
  Filled 2021-04-26: qty 1

## 2021-04-26 MED ORDER — LIDOCAINE HCL (PF) 1 % IJ SOLN
INTRAMUSCULAR | Status: DC | PRN
  Administered 2021-04-26: 2 mL

## 2021-04-26 MED ORDER — SODIUM CHLORIDE 0.9 % WEIGHT BASED INFUSION
1.0000 mL/kg/h | INTRAVENOUS | Status: AC
  Administered 2021-04-26: 1 mL/kg/h via INTRAVENOUS

## 2021-04-26 MED ORDER — SODIUM CHLORIDE 0.9% FLUSH: 3.0000 mL | INTRAVENOUS | Status: DC | PRN

## 2021-04-26 MED ORDER — ISOSORBIDE MONONITRATE ER 30 MG PO TB24
30.0000 mg | ORAL_TABLET | Freq: Every day | ORAL | Status: DC
  Administered 2021-04-26: 30 mg via ORAL
  Filled 2021-04-26 (×2): qty 1

## 2021-04-26 MED ORDER — HEPARIN SODIUM (PORCINE) 1000 UNIT/ML IJ SOLN
INTRAMUSCULAR | Status: DC | PRN
  Administered 2021-04-26: 6000 [IU] via INTRAVENOUS

## 2021-04-26 SURGICAL SUPPLY — 8 items
CATH 5FR JL3.5 JR4 ANG PIG MP (CATHETERS) ×2 IMPLANT
DEVICE RAD COMP TR BAND LRG (VASCULAR PRODUCTS) ×2 IMPLANT
GLIDESHEATH SLEND SS 6F .021 (SHEATH) ×2 IMPLANT
KIT HEART LEFT (KITS) ×2 IMPLANT
PACK CARDIAC CATHETERIZATION (CUSTOM PROCEDURE TRAY) ×2 IMPLANT
TRANSDUCER W/STOPCOCK (MISCELLANEOUS) ×2 IMPLANT
TUBING CIL FLEX 10 FLL-RA (TUBING) ×2 IMPLANT
WIRE HI TORQ VERSACORE J 260CM (WIRE) ×2 IMPLANT

## 2021-04-26 NOTE — Interval H&P Note (Signed)
History and Physical Interval Note:  04/26/2021 10:57 AM  George Matthews  has presented today for surgery, with the diagnosis of nstemi.  The various methods of treatment have been discussed with the patient and family. After consideration of risks, benefits and other options for treatment, the patient has consented to  Procedure(s): LEFT HEART CATH AND CORONARY ANGIOGRAPHY (N/A) as a surgical intervention.  The patient's history has been reviewed, patient examined, no change in status, stable for surgery.  I have reviewed the patient's chart and labs.  Questions were answered to the patient's satisfaction.   Cath Lab Visit (complete for each Cath Lab visit)  Clinical Evaluation Leading to the Procedure:   ACS: Yes.    Non-ACS:    Anginal Classification: CCS IV  Anti-ischemic medical therapy: No Therapy  Non-Invasive Test Results: No non-invasive testing performed  Prior CABG: No previous CABG        [name] [facility] 04/26/2021 10:58 AM

## 2021-04-26 NOTE — ED Notes (Addendum)
Date and time results received: 04/26/21 0115 (use smartphrase ".now" to insert current time)  Test: Troponin Critical Value: [place]  Name of Provider Notified: [place] EDP  Orders Received? Or Actions Taken?: NA

## 2021-04-26 NOTE — Progress Notes (Signed)
ANTICOAGULATION CONSULT NOTE - Follow Up Consult  Pharmacy Consult for heparin Indication:  CP/ACS and h/o VTE  Labs: Recent Labs    04/25/21 1853 04/25/21 2009 04/25/21 2223 04/26/21 0012 04/26/21 0544  HGB 13.0  --   --   --  12.6*  HCT 40.8  --   --   --  38.3*  PLT 306  --   --   --  267  APTT 26  --   --   --  100*  HEPARINUNFRC  --   --  0.93*  --  0.62  CREATININE 1.07  --   --   --   --   TROPONINIHS 1,950* 2,861*  --  6,462*  --     Assessment/Plan:  70yo male therapeutic on heparin with initial dosing while Xarelto on hold. Will continue gtt at current rate of 1400 units/hr and confirm stable with additional level.   Wynona Neat, PharmD, BCPS  04/26/2021,6:30 AM

## 2021-04-26 NOTE — Progress Notes (Signed)
  Echocardiogram 2D Echocardiogram has been performed.  George Matthews 04/26/2021, 9:35 AM

## 2021-04-27 ENCOUNTER — Encounter (HOSPITAL_COMMUNITY): Payer: Self-pay | Admitting: Cardiology

## 2021-04-27 ENCOUNTER — Other Ambulatory Visit: Payer: Self-pay

## 2021-04-27 ENCOUNTER — Other Ambulatory Visit (HOSPITAL_COMMUNITY): Payer: Self-pay

## 2021-04-27 DIAGNOSIS — I10 Essential (primary) hypertension: Secondary | ICD-10-CM

## 2021-04-27 DIAGNOSIS — Z86711 Personal history of pulmonary embolism: Secondary | ICD-10-CM

## 2021-04-27 DIAGNOSIS — R079 Chest pain, unspecified: Secondary | ICD-10-CM

## 2021-04-27 DIAGNOSIS — Z86718 Personal history of other venous thrombosis and embolism: Secondary | ICD-10-CM

## 2021-04-27 DIAGNOSIS — E785 Hyperlipidemia, unspecified: Secondary | ICD-10-CM

## 2021-04-27 DIAGNOSIS — I2583 Coronary atherosclerosis due to lipid rich plaque: Secondary | ICD-10-CM

## 2021-04-27 DIAGNOSIS — R7989 Other specified abnormal findings of blood chemistry: Secondary | ICD-10-CM

## 2021-04-27 DIAGNOSIS — R778 Other specified abnormalities of plasma proteins: Secondary | ICD-10-CM

## 2021-04-27 DIAGNOSIS — I2510 Atherosclerotic heart disease of native coronary artery without angina pectoris: Secondary | ICD-10-CM

## 2021-04-27 DIAGNOSIS — E7800 Pure hypercholesterolemia, unspecified: Secondary | ICD-10-CM

## 2021-04-27 LAB — CBC
HCT: 34 % — ABNORMAL LOW (ref 39.0–52.0)
Hemoglobin: 11.3 g/dL — ABNORMAL LOW (ref 13.0–17.0)
MCH: 29.7 pg (ref 26.0–34.0)
MCHC: 33.2 g/dL (ref 30.0–36.0)
MCV: 89.5 fL (ref 80.0–100.0)
Platelets: 260 10*3/uL (ref 150–400)
RBC: 3.8 MIL/uL — ABNORMAL LOW (ref 4.22–5.81)
RDW: 15.4 % (ref 11.5–15.5)
WBC: 9.6 10*3/uL (ref 4.0–10.5)
nRBC: 0 % (ref 0.0–0.2)

## 2021-04-27 LAB — D-DIMER, QUANTITATIVE: D-Dimer, Quant: 0.52 ug/mL-FEU — ABNORMAL HIGH (ref 0.00–0.50)

## 2021-04-27 LAB — BASIC METABOLIC PANEL
Anion gap: 5 (ref 5–15)
BUN: 12 mg/dL (ref 8–23)
CO2: 24 mmol/L (ref 22–32)
Calcium: 8.1 mg/dL — ABNORMAL LOW (ref 8.9–10.3)
Chloride: 106 mmol/L (ref 98–111)
Creatinine, Ser: 1.13 mg/dL (ref 0.61–1.24)
GFR, Estimated: >60 mL/min (ref >60)
Glucose, Bld: 103 mg/dL — ABNORMAL HIGH (ref 70–99)
Potassium: 3.8 mmol/L (ref 3.5–5.1)
Sodium: 135 mmol/L (ref 135–145)

## 2021-04-27 MED ORDER — ISOSORBIDE MONONITRATE ER 30 MG PO TB24
15.0000 mg | ORAL_TABLET | Freq: Every day | ORAL | 2 refills | Status: DC
  Filled 2021-04-27: qty 30, 60d supply, fill #0

## 2021-04-27 MED ORDER — RIVAROXABAN 20 MG PO TABS
20.0000 mg | ORAL_TABLET | Freq: Every day | ORAL | Status: DC
  Administered 2021-04-27: 20 mg via ORAL
  Filled 2021-04-27: qty 1

## 2021-04-27 MED ORDER — ISOSORBIDE MONONITRATE ER 30 MG PO TB24
15.0000 mg | ORAL_TABLET | Freq: Every day | ORAL | Status: DC
  Filled 2021-04-27: qty 1

## 2021-04-27 MED ORDER — ASPIRIN 81 MG PO TBEC: 81.0000 mg | DELAYED_RELEASE_TABLET | Freq: Every day | ORAL | 11 refills | Status: AC

## 2021-04-27 MED ORDER — ATORVASTATIN CALCIUM 80 MG PO TABS
80.0000 mg | ORAL_TABLET | Freq: Every day | ORAL | 2 refills | Status: DC
  Filled 2021-04-27: qty 30, 30d supply, fill #0

## 2021-04-27 MED ORDER — NITROGLYCERIN 0.4 MG SL SUBL
0.4000 mg | SUBLINGUAL_TABLET | SUBLINGUAL | 2 refills | Status: AC | PRN
  Filled 2021-04-27: qty 25, 7d supply, fill #0

## 2021-04-27 MED FILL — Ticagrelor Tab 90 MG: ORAL | Qty: 2 | Status: AC

## 2021-04-27 NOTE — Discharge Summary (Signed)
Discharge Summary    Patient ID: [name] MRN: [id number]; DOB: Oct 10, 1951  Admit date: 04/25/2021 Discharge date: 04/27/2021  PCP:  George Sill, NP   [facility] Providers Cardiologist:  New (Patient lives in [place] and would like to follow there with Dr. Percival Spanish)  Discharge Diagnoses    Active Problems:   NSTEMI (non-ST elevated myocardial infarction) [facility])   CAD (coronary artery disease)   History of pulmonary embolus (PE)   History of DVT (deep vein thrombosis)   Hypertension   Hyperlipidemia   Chest pain with high risk of acute coronary syndrome   Elevated troponin    Diagnostic Studies/Procedures    Echocardiogram 04/26/2021: Impressions:  1. Left ventricular ejection fraction, by estimation, is 55 to 60%. The  left ventricle has normal function. The left ventricle has no regional  wall motion abnormalities. There is mild left ventricular hypertrophy.  Left ventricular diastolic parameters  were normal.   2. Right ventricular systolic function is normal. The right ventricular  size is normal. Tricuspid regurgitation signal is inadequate for assessing  PA pressure.   3. The mitral valve is grossly normal. Trivial mitral valve  regurgitation.   4. The aortic valve is tricuspid. Aortic valve regurgitation is not  visualized. Aortic valve mean gradient measures 2.0 mmHg.   5. The inferior vena cava is normal in size with greater than 50%  respiratory variability, suggesting right atrial pressure of 3 mmHg.  _____________  Cardiac Catheterization 04/26/2021: 1st Diag lesion is 50% stenosed. Prox LAD to Mid LAD lesion is 30% stenosed. 1st Mrg lesion is 30% stenosed. Prox Cx to Mid Cx lesion is 20% stenosed. Dist RCA lesion is 100% stenosed. LV end diastolic pressure is normal.   1. Single vessel occlusive CAD involving the distal RCA. There are left to right collaterals 2. Normal LVEDP   Plan: recommend medical therapy. OK to resume Xarelto  tomorrow.   Diagnostic Dominance: Right     History of Present Illness     George Matthews is a 70 y.o. 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 Patient reported he was in his usual state of health until 04/24/2021 when he developed significant diaphoresis while working in his garden shucking corn. He went back home and took a 30 minute nap with improvement in symptoms. He then accompanied his wife to an appointment later that afternoon and while there developed chest pressure with associated left arm discomfort and nausea/vomiting which prompted him to come to the ED.   In the [facility] ED, EKG showed sinus bradycardia, rate 54 bpm, with inferior infarct pattern and slight ST elevation along V2. High-sensitivity troponin elevated at 1,950 >> 2,861 >> 6,462. Chest x-ray showed no acute findings. WBC 6.7, Hgb 13.0, Plts 306. Na 139, K 4.2, Cr 1.07.   He was started on IV Heparin as well as aspirin and statin therapy and then transferred to [facility] for cardiac catheterization.  Hospital Course     Consultants: None  NSTEMI CAD Patient transferred from [facility] for further management of NSTEMI. High-sensitivity troponin peaked at 6,462. Cardiac catheterization on 04/26/2021 showed single vessel occlusive CAD with 100% stenosis of distal RCA with left to right collaterals. Otherwise, non-obstructive disease. Medical therapy was recommended. Xarelto restarted on 04/27/2021.  Plan is for concurrent antiplatelet therapy with Aspirin 81mg  daily for 12 months. Started on Imdur 15mg  daily as well as high-intensity statin. Also started on high-intensity statin. No beta-blocker due to baseline bradycardia.  History of Unprovoked DVT/PE S/p IVC filter which was  ultimately removed in 2015. D-dimer 0.52 which is negative when adjusted for age. Continue chronic anticoagulation with Xarelto 20mg  daily.   Hypertension History of hypertension but BP has been soft this admission with systolic BP in the 75Z at times. Asymptomatic with this. Started on low dose Imdur 15mg  daily. On Telmisartan-HCTZ at home but these were held on admission. Will continue to hold at discharge given soft BP and addition of Imdur. Patient to monitor BP at home with these medications changes.  Hyperlipidemia Lipid panel this admission: Total Cholesterol 158, Triglycerides 33, HDL 38, LDL 113. LDL goal <70 given CAD. Started on Lipitor 80mg  daily. Will need repeat lipid panel and LFTs in 6 weeks.  Patient seen and examined by Dr. Radford Pax today and determined to be stable for discharge. Outpatient follow-up has been arranged. Medications as below.    Did the patient have an acute coronary syndrome (MI, NSTEMI, STEMI, etc) this admission?:  Yes                               AHA/ACC Clinical Performance & Quality Measures: Aspirin prescribed? - Yes ADP Receptor Inhibitor (Plavix/Clopidogrel, Brilinta/Ticagrelor or Effient/Prasugrel) prescribed (includes medically managed patients)? - No - no PCI performed and patient on DOAC for history of unprovoked DVT/PE Beta Blocker prescribed? - No - baseline bradycardia High Intensity Statin (Lipitor 40-80mg  or Crestor 20-40mg ) prescribed? - Yes EF assessed during THIS hospitalization? - Yes For EF <40%, was ACEI/ARB prescribed? - Not Applicable (EF >/= 02%) For EF <40%, Aldosterone Antagonist (Spironolactone or Eplerenone) prescribed? - Not Applicable (EF >/= 58%) Cardiac Rehab Phase II ordered (including medically managed patients)? - Yes      _____________  Discharge Vitals Blood pressure (!) 106/53, pulse (!) 56, temperature 99.2 F (37.3 C), temperature source Oral, resp. rate 19, height 6' 3.51" (1.918 m), weight 117.5 kg, SpO2 96 %.   Filed Weights   04/25/21 2203 04/27/21 0344  Weight: 120.2 kg 117.5 kg   General: 70 y.o. male resting comfortably in no acute distress. HEENT: Normocephalic and atraumatic. Sclera clear.  Heart: RRR. Distinct S1 and S2. Soft II/VI systolic murmur. No rubs or gallops. Right radial cath site soft and stable with no signs of hematoma.   Lungs: No increased work of breathing. Clear to ausculation bilaterally. No wheezes, rhonchi, or rales.  Abdomen: Soft, non-distended, and non-tender to palpation.  Extremities: No lower extremity edema.    Skin: Warm and dry. Neuro: Alert and oriented x3. No focal deficits. Psych: Normal affect. Responds appropriately.  Labs & Radiologic Studies    CBC Recent Labs    04/26/21 0544 04/27/21 0403  WBC 6.7 9.6  HGB 12.6* 11.3*  HCT 38.3* 34.0*  MCV 89.7 89.5  PLT 267 527   Basic Metabolic Panel Recent Labs    04/26/21 0544 04/27/21 0403  NA 138 135  K 3.6 3.8  CL 107 106  CO2 24 24  GLUCOSE 106* 103*  BUN 14 12  CREATININE 0.96 1.13  CALCIUM 8.6* 8.1*   Liver Function Tests No results for input(s): AST, ALT, ALKPHOS, BILITOT, PROT, ALBUMIN in the last 72 hours. No results for input(s): LIPASE, AMYLASE in the  last 72 hours. High Sensitivity Troponin:   Recent Labs  Lab 04/25/21 1853 04/25/21 2009 04/26/21 0012  TROPONINIHS 1,950* 2,861* 6,462*    BNP Invalid input(s): POCBNP D-Dimer Recent Labs    04/27/21 0933  DDIMER 0.52*   Hemoglobin A1C Recent Labs    04/26/21 0012  HGBA1C 6.3*   Fasting Lipid Panel Recent Labs    04/26/21 0012  CHOL 158  HDL 38*  LDLCALC 113*  TRIG 33  CHOLHDL 4.2   Thyroid Function Tests Recent Labs    04/26/21 0012  TSH 1.286   _____________  DG Chest 2 View  Result Date: 04/25/2021 CLINICAL DATA:  Chest pain radiating to left arm EXAM: CHEST - 2 VIEW COMPARISON:  April 23, 2015 FINDINGS: The heart size and mediastinal contours are within normal limits. Aortic atherosclerosis. No  focal consolidation. No pleural effusion. No pneumothorax. The visualized skeletal structures are unremarkable. IMPRESSION: No active cardiopulmonary disease. Electronically Signed   By: Dahlia Bailiff MD   On: 04/25/2021 19:37   CARDIAC CATHETERIZATION  Result Date: 04/27/2021  1st Diag lesion is 50% stenosed.  Prox LAD to Mid LAD lesion is 30% stenosed.  1st Mrg lesion is 30% stenosed.  Prox Cx to Mid Cx lesion is 20% stenosed.  Dist RCA lesion is 100% stenosed.  LV end diastolic pressure is normal.  1. Single vessel occlusive CAD involving the distal RCA. There are left to right collaterals 2. Normal LVEDP Plan: recommend medical therapy. OK to resume Xarelto tomorrow.   ECHOCARDIOGRAM COMPLETE  Result Date: 04/26/2021    ECHOCARDIOGRAM REPORT   Patient Name:   George Matthews Date of Exam: 04/26/2021 Medical Rec #:  [id number]     Height:       75.5 in Accession #:    [id number]    Weight:       265.0 lb Date of Birth:  [birth year]      BSA:          2.485 m Patient Age:    19 years      BP:           112/52 mmHg Patient Gender: M             HR:           64 bpm. Exam Location:  [facility] Procedure: 2D Echo, Cardiac Doppler and Color Doppler Indications:    NSTEMI  History:        Patient has prior history of Echocardiogram examinations, most                 recent 06/27/2014. Signs/Symptoms:Chest Pain; Risk Factors:Former                 Smoker.  Sonographer:    Wenda Low Referring Phys: [id number] CHRISTOPHER A Letts  1. Left ventricular ejection fraction, by estimation, is 55 to 60%. The left ventricle has normal function. The left ventricle has no regional wall motion abnormalities. There is mild left ventricular hypertrophy. Left ventricular diastolic parameters were normal.  2. Right ventricular systolic function is normal. The right ventricular size is normal. Tricuspid regurgitation signal is inadequate for assessing PA pressure.  3. The mitral valve is grossly normal. Trivial  mitral valve regurgitation.  4. The aortic valve is tricuspid. Aortic valve regurgitation is not visualized. Aortic valve mean gradient measures 2.0 mmHg.  5. The inferior vena cava is normal in size with greater than 50% respiratory variability, suggesting right atrial pressure of  3 mmHg. FINDINGS  Left Ventricle: Left ventricular ejection fraction, by estimation, is 55 to 60%. The left ventricle has normal function. The left ventricle has no regional wall motion abnormalities. The left ventricular internal cavity size was normal in size. There is  mild left ventricular hypertrophy. Left ventricular diastolic parameters were normal.  LV Wall Scoring: The basal inferior segment is hypokinetic. The entire anterior wall, entire lateral wall, entire septum, entire apex, and mid and distal inferior wall are normal. Right Ventricle: The right ventricular size is normal. No increase in right ventricular wall thickness. Right ventricular systolic function is normal. Tricuspid regurgitation signal is inadequate for assessing PA pressure. Left Atrium: Left atrial size was normal in size. Right Atrium: Right atrial size was normal in size. Pericardium: There is no evidence of pericardial effusion. Mitral Valve: The mitral valve is grossly normal. Mild mitral annular calcification. Trivial mitral valve regurgitation. MV peak gradient, 3.3 mmHg. The mean mitral valve gradient is 1.0 mmHg. Tricuspid Valve: The tricuspid valve is grossly normal. Tricuspid valve regurgitation is trivial. Aortic Valve: The aortic valve is tricuspid. There is mild aortic valve annular calcification. Aortic valve regurgitation is not visualized. Aortic valve mean gradient measures 2.0 mmHg. Aortic valve peak gradient measures 4.8 mmHg. Aortic valve area, by  VTI measures 2.75 cm. Pulmonic Valve: The pulmonic valve was grossly normal. Pulmonic valve regurgitation is trivial. Aorta: The aortic root is normal in size and structure. Venous: The inferior  vena cava is normal in size with greater than 50% respiratory variability, suggesting right atrial pressure of 3 mmHg. IAS/Shunts: No atrial level shunt detected by color flow Doppler.  LEFT VENTRICLE PLAX 2D LVIDd:         5.12 cm     Diastology LVIDs:         3.09 cm     LV e' medial:    6.92 cm/s LV PW:         1.19 cm     LV E/e' medial:  14.0 LV IVS:        0.97 cm     LV e' lateral:   5.52 cm/s LVOT diam:     2.00 cm     LV E/e' lateral: 17.5 LV SV:         74 LV SV Index:   30 LVOT Area:     3.14 cm  LV Volumes (MOD) LV vol d, MOD A2C: 80.7 ml LV vol d, MOD A4C: 92.0 ml LV vol s, MOD A2C: 46.0 ml LV vol s, MOD A4C: 51.0 ml LV SV MOD A2C:     34.7 ml LV SV MOD A4C:     92.0 ml LV SV MOD BP:      40.4 ml RIGHT VENTRICLE RV Basal diam:  3.35 cm RV Mid diam:    3.12 cm RV S prime:     10.80 cm/s TAPSE (M-mode): 2.0 cm LEFT ATRIUM             Index       RIGHT ATRIUM           Index LA diam:        3.70 cm 1.49 cm/m  RA Area:     15.00 cm LA Vol (A2C):   49.0 ml 19.72 ml/m RA Volume:   37.20 ml  14.97 ml/m LA Vol (A4C):   55.0 ml 22.13 ml/m LA Biplane Vol: 57.5 ml 23.14 ml/m  AORTIC VALVE AV Area (Vmax):    3.06  cm AV Area (Vmean):   2.79 cm AV Area (VTI):     2.75 cm AV Vmax:           109.00 cm/s AV Vmean:          71.800 cm/s AV VTI:            0.268 m AV Peak Grad:      4.8 mmHg AV Mean Grad:      2.0 mmHg LVOT Vmax:         106.00 cm/s LVOT Vmean:        63.700 cm/s LVOT VTI:          0.235 m LVOT/AV VTI ratio: 0.88  AORTA Ao Root diam: 3.70 cm Ao Asc diam:  3.10 cm MITRAL VALVE MV Area (PHT): 3.76 cm    SHUNTS MV Area VTI:   2.45 cm    Systemic VTI:  0.24 m MV Peak grad:  3.3 mmHg    Systemic Diam: 2.00 cm MV Mean grad:  1.0 mmHg MV Vmax:       0.92 m/s MV Vmean:      37.8 cm/s MV Decel Time: 202 msec MV E velocity: 96.80 cm/s MV A velocity: 53.10 cm/s MV E/A ratio:  1.82 Rozann Lesches MD Electronically signed by Rozann Lesches MD Signature Date/Time: 04/26/2021/1:20:45 PM    Final     Disposition   Patient is being discharged home today in good condition.  Follow-up Plans & Appointments     Follow-up [facility] Follow up.   Specialty: Cardiology Why: Hospital follow-up with scheduled for 05/10/2021 at 10am with Laurann Montana, one of our Nurse Practitioners at our [place] office. Please arrive 15 minutes early for check-in. If this date/time does not work for you, please call our office to reschedule. Contact information: [address] [place] [address] [phone]               Discharge Instructions     Amb Referral to Cardiac Rehabilitation   Complete by: As directed    Diagnosis: NSTEMI   After initial evaluation and assessments completed: Virtual Based Care may be provided alone or in conjunction with Phase 2 Cardiac Rehab based on patient barriers.: Yes   Diet - low sodium heart healthy   Complete by: As directed    Increase activity slowly   Complete by: As directed        Discharge Medications   Allergies as of 04/27/2021       Reactions   Codeine Itching        Medication List     STOP taking these medications    telmisartan-hydrochlorothiazide 80-12.5 MG tablet Commonly known as: MICARDIS HCT       TAKE these medications    acetaminophen 500 MG tablet Commonly known as: TYLENOL Take 500 mg by mouth every 6 (six) hours as needed for mild pain or moderate pain.   allopurinol 300 MG tablet Commonly known as: ZYLOPRIM Take 300 mg by mouth daily.   ascorbic acid 500 MG tablet Commonly known as: VITAMIN C Take 500 mg by mouth daily.   aspirin 81 MG EC tablet Take 1 tablet (81 mg total) by mouth daily. Swallow whole.   atorvastatin 80 MG tablet Commonly known as: LIPITOR Take 1 tablet (80 mg total) by mouth daily.   ergocalciferol 1.25 MG (50000 UT) capsule Commonly known as: VITAMIN D2 Take 50,000 Units by mouth 2 (two)  times a  week.   Fish Oil 1000 MG Caps Take 1,000 mg by mouth daily.   Garlic 742 MG Tabs Take 100 mg by mouth daily.   isosorbide mononitrate 30 MG 24 hr tablet Commonly known as: IMDUR Take 0.5 tablets (15 mg total) by mouth daily.   meclizine 25 MG tablet Commonly known as: ANTIVERT Take 1 tablet (25 mg total) by mouth 3 (three) times daily as needed for dizziness.   nitroGLYCERIN 0.4 MG SL tablet Commonly known as: NITROSTAT Place 1 tablet (0.4 mg total) under the tongue every 5 (five) minutes x 3 doses as needed for chest pain.   rivaroxaban 20 MG Tabs tablet Commonly known as: XARELTO Take 20 mg by mouth daily with supper.           Outstanding Labs/Studies   N/A.  Duration of Discharge Encounter   Greater than 30 minutes including physician time.  Signed, Darreld Mclean, PA-C 04/27/2021, 11:42 AM

## 2021-04-27 NOTE — Discharge Instructions (Addendum)
Medication Changes:  - START Aspirin 81mg  daily in addition to the Xarelto 20mg  daily which you are already taking.  - START Lipitor 80mg  daily. This is for your cholesterol. - START Imdur 15mg  daily.  - STOP Telmisartan-HCTZ. - We have also given you a prescription of sublingual Nitro for you to take only as needed for chest pain. Please take this sitting down. If your systolic BP (top number) is less than 105, do not take it as it may drop your BP too low.  Please monitor your BP at home the next several days after above medication changes.  Post NSTEMI: NO HEAVY LIFTING X 2 WEEKS. NO SEXUAL ACTIVITY X 2 WEEKS. NO DRIVING X 1 WEEK. NO SOAKING BATHS, HOT TUBS, POOLS, ETC., X 7 DAYS.  Radial Site Care: Refer to this sheet in the next few weeks. These instructions provide you with information on caring for yourself after your procedure. Your caregiver may also give you more specific instructions. Your treatment has been planned according to current medical practices, but problems sometimes occur. Call your caregiver if you have any problems or questions after your procedure. HOME CARE INSTRUCTIONS You may shower the day after the procedure. Remove the bandage (dressing) and gently wash the site with plain soap and water. Gently pat the site dry.  Do not apply powder or lotion to the site.  Do not submerge the affected site in water for 3 to 5 days.  Inspect the site at least twice daily.  Do not flex or bend the affected arm for 24 hours.  No lifting over 5 pounds (2.3 kg) for 5 days after your procedure.  Do not drive home if you are discharged the same day of the procedure. Have someone else drive you.  What to expect: Any bruising will usually fade within 1 to 2 weeks.  Blood that collects in the tissue (hematoma) may be painful to the touch. It should usually decrease in size and tenderness within 1 to 2 weeks.  SEEK IMMEDIATE MEDICAL CARE IF: You have unusual pain at the radial site.   You have redness, warmth, swelling, or pain at the radial site.  You have drainage (other than a small amount of blood on the dressing).  You have chills.  You have a fever or persistent symptoms for more than 72 hours.  You have a fever and your symptoms suddenly get worse.  Your arm becomes pale, cool, tingly, or numb.  You have heavy bleeding from the site. Hold pressure on the site.

## 2021-04-27 NOTE — Progress Notes (Signed)
CARDIAC REHAB PHASE I   PRE:  Rate/Rhythm: 60 SR    BP: sitting 95/46    SaO2: 96 RA  MODE:  Ambulation: 370 ft   POST:  Rate/Rhythm: 63 SR    BP: sitting 106/53     SaO2: 96 RA  Pt able to walk hall without c/o. No CP, SOB or dizziness. HR and BP low, he sts he used to be high in BP. Discussed MI, restrictions, diet, exercise, tobacco cessation, NTG and CRPII with pt and wife. Pt voiced understanding although I'm not sure he is ready to quit tobacco. He is thinking about it. Did discuss A1C being elevated and decreasing carbs/sugars. Will refer to [place].  [id number]   [employer], ACSM 04/27/2021 11:23 AM

## 2021-05-10 ENCOUNTER — Other Ambulatory Visit (HOSPITAL_BASED_OUTPATIENT_CLINIC_OR_DEPARTMENT_OTHER): Payer: Self-pay | Admitting: *Deleted

## 2021-05-10 ENCOUNTER — Encounter (HOSPITAL_BASED_OUTPATIENT_CLINIC_OR_DEPARTMENT_OTHER): Payer: Self-pay | Admitting: Family

## 2021-05-10 ENCOUNTER — Ambulatory Visit (HOSPITAL_BASED_OUTPATIENT_CLINIC_OR_DEPARTMENT_OTHER): Payer: Medicare HMO | Admitting: Family

## 2021-05-10 ENCOUNTER — Other Ambulatory Visit: Payer: Self-pay

## 2021-05-10 VITALS — BP 150/76 | HR 64 | Ht 75.5 in | Wt 250.0 lb

## 2021-05-10 DIAGNOSIS — R001 Bradycardia, unspecified: Secondary | ICD-10-CM | POA: Diagnosis not present

## 2021-05-10 DIAGNOSIS — I10 Essential (primary) hypertension: Secondary | ICD-10-CM | POA: Diagnosis not present

## 2021-05-10 DIAGNOSIS — I25118 Atherosclerotic heart disease of native coronary artery with other forms of angina pectoris: Secondary | ICD-10-CM

## 2021-05-10 DIAGNOSIS — E785 Hyperlipidemia, unspecified: Secondary | ICD-10-CM

## 2021-05-10 DIAGNOSIS — Z7901 Long term (current) use of anticoagulants: Secondary | ICD-10-CM

## 2021-05-10 MED ORDER — PANTOPRAZOLE SODIUM 20 MG PO TBEC: 20.0000 mg | DELAYED_RELEASE_TABLET | Freq: Every day | ORAL | 3 refills | Status: DC

## 2021-05-10 MED ORDER — ISOSORBIDE MONONITRATE ER 30 MG PO TB24: 30.0000 mg | ORAL_TABLET | Freq: Every day | ORAL | 3 refills | Status: DC

## 2021-05-10 NOTE — Progress Notes (Signed)
Office Visit    Patient Name: George Matthews Date of Encounter: 05/10/2021  PCP:  Adaline Sill, NP   [place]  Cardiologist:  Minus Breeding, MD  Advanced Practice Provider:  No care team member to display Electrophysiologist:  None   Chief Complaint    George Matthews is a 70 y.o. male with a hx of coronary disease, CVA, unprovoked DVT/PE s/p IVC (removed in 2015) on chronic anticoagulation Xarelto, hypertension presents today for hospital follow up.   Past Medical History    Past Medical History:  Diagnosis Date   DJD (degenerative joint disease)    DVT (deep venous thrombosis) ([place]) 06/25/2014   ED (erectile dysfunction)    HTN (hypertension)    Polio    right leg/arm weakness   Pulmonary embolism ([place]) 06/25/2014   Somatic dysfunction    Stroke [facility])    affected vision and speech mildly   Tubular adenoma of colon 2002   Past Surgical History:  Procedure Laterality Date   COLONOSCOPY  2002   Dr. Rehman--> Single diverticulum at the splenic flexure, tiny polyp at transverse colon, tubular adenomatous polyp.   COLONOSCOPY  08/23/2012   Procedure: COLONOSCOPY;  Surgeon: Danie Binder, MD;  Location[facility];  Service: Endoscopy;  Laterality: N/A;  10:15   COLONOSCOPY WITH PROPOFOL N/A 10/01/2020   Procedure: COLONOSCOPY WITH PROPOFOL;  Surgeon: Harvel Quale, MD;  Location: [facility];  Service: Gastroenterology;  Laterality: N/A;  7:30 am   LEFT HEART CATH AND CORONARY ANGIOGRAPHY N/A 04/26/2021   Procedure: LEFT HEART CATH AND CORONARY ANGIOGRAPHY;  Surgeon: Martinique, Peter M, MD;  Location: [place];  Service: Cardiovascular;  Laterality: N/A;   POLYPECTOMY  10/01/2020   Procedure: POLYPECTOMY;  Surgeon: Montez Morita, Quillian Quince, MD;  Location: [facility];  Service: Gastroenterology;;    Allergies  Allergies  Allergen Reactions   Codeine Itching    History of Present Illness    George Matthews is  a 70 y.o. male with a hx of coronary disease, CVA, unprovoked DVT/PE s/p IVC (removed in 2015) on chronic anticoagulation Xarelto, hypertension last seen while hospitalized.  Presented to [facility] April 25, 2021 with chest pain associate with left arm pain and nausea.  EKG on admission showed sinus bradycardia rate 54 bpm with inferior infarct pattern and slight ST elevation along V2.  High-sensitivity troponin elevated 1,950 ? 2,861 ? 6,462.  He was started on IV heparin, aspirin, statin and transferred to [facility] for cardiac catheterization.  In a cardiac catheterization April 26, 2021 showing single-vessel occlusive CAD with 100% stenosis of distal RCA with left to right collaterals. Otherwise nonobstructive disease. Recommended for aspirin for 12 months and Imdur started. No beta blocker due to bradycardia.   He presents today for follow up with his wife and grandson. No chest pain, pressure, tightness. Right radial catheterization site healing without pain nor bruising. He has some dyspnea on exertion which is overall improving since hospital discharge. Does note sensation of feeling like food was stuck in his esophagus which he notices every once in awhile. He has not ever been on medication for acid reflux.   EKGs/Labs/Other Studies Reviewed:   The following studies were reviewed today: Echocardiogram 04/26/2021: Impressions:  1. Left ventricular ejection fraction, by estimation, is 55 to 60%. The  left ventricle has normal function. The left ventricle has no regional  wall motion abnormalities. There is mild left ventricular  hypertrophy.  Left ventricular diastolic parameters  were normal.   2. Right ventricular systolic function is normal. The right ventricular  size is normal. Tricuspid regurgitation signal is inadequate for assessing  PA pressure.   3. The mitral valve is grossly normal. Trivial mitral valve  regurgitation.   4. The aortic valve is tricuspid. Aortic valve  regurgitation is not  visualized. Aortic valve mean gradient measures 2.0 mmHg.   5. The inferior vena cava is normal in size with greater than 50%  respiratory variability, suggesting right atrial pressure of 3 mmHg. _____________   Cardiac Catheterization 04/26/2021: 1st Diag lesion is 50% stenosed. Prox LAD to Mid LAD lesion is 30% stenosed. 1st Mrg lesion is 30% stenosed. Prox Cx to Mid Cx lesion is 20% stenosed. Dist RCA lesion is 100% stenosed. LV end diastolic pressure is normal.   1. Single vessel occlusive CAD involving the distal RCA. There are left to right collaterals 2. Normal LVEDP   Plan: recommend medical therapy. OK to resume Xarelto tomorrow.   Diagnostic Dominance: Right       EKG:  No EKG today  Recent Labs: 04/26/2021: B Natriuretic Peptide 128.0; TSH 1.286 04/27/2021: BUN 12; Creatinine, Ser 1.13; Hemoglobin 11.3; Platelets 260; Potassium 3.8; Sodium 135  Recent Lipid Panel    Component Value Date/Time   CHOL 158 04/26/2021 0012   TRIG 33 04/26/2021 0012   HDL 38 (L) 04/26/2021 0012   CHOLHDL 4.2 04/26/2021 0012   VLDL 7 04/26/2021 0012   LDLCALC 113 (H) 04/26/2021 0012    Home Medications   Current Meds  Medication Sig   acetaminophen (TYLENOL) 500 MG tablet Take 500 mg by mouth every 6 (six) hours as needed for mild pain or moderate pain.   allopurinol (ZYLOPRIM) 300 MG tablet Take 300 mg by mouth daily.    ascorbic acid (VITAMIN C) 500 MG tablet Take 500 mg by mouth daily.   aspirin EC 81 MG EC tablet Take 1 tablet (81 mg total) by mouth daily. Swallow whole.   atorvastatin (LIPITOR) 80 MG tablet Take 1 tablet (80 mg total) by mouth daily.   ergocalciferol (VITAMIN D2) 50000 UNITS capsule Take 50,000 Units by mouth 2 (two) times a week.   Garlic 579 MG TABS Take 100 mg by mouth daily.   meclizine (ANTIVERT) 25 MG tablet Take 1 tablet (25 mg total) by mouth 3 (three) times daily as needed for dizziness.   nitroGLYCERIN (NITROSTAT) 0.4 MG SL  tablet Place 1 tablet (0.4 mg total) under the tongue every 5 (five) minutes x 3 doses as needed for chest pain.   Omega-3 Fatty Acids (FISH OIL) 1000 MG CAPS Take 1,000 mg by mouth daily.   pantoprazole (PROTONIX) 20 MG tablet Take 1 tablet (20 mg total) by mouth daily.   rivaroxaban (XARELTO) 20 MG TABS tablet Take 20 mg by mouth daily with supper.   telmisartan-hydrochlorothiazide (MICARDIS HCT) 80-12.5 MG tablet Take 1 tablet by mouth daily.   [DISCONTINUED] isosorbide mononitrate (IMDUR) 30 MG 24 hr tablet Take 0.5 tablets (15 mg total) by mouth daily.     Review of Systems      All other systems reviewed and are otherwise negative except as noted above.  Physical Exam    VS:  BP (!) 150/76   Pulse 64   Ht 6' 3.5" (1.918 m)   Wt 250 lb (113.4 kg)   BMI 30.84 kg/m  , BMI Body mass index is 30.84 kg/m.  Wt Readings from  Last 3 Encounters:  05/10/21 250 lb (113.4 kg)  04/27/21 259 lb (117.5 kg)  02/08/21 259 lb (117.5 kg)     GEN: Well nourished, well developed, in no acute distress. HEENT: normal. Neck: Supple, no JVD, carotid bruits, or masses. Cardiac: RRR, no murmurs, rubs, or gallops. No clubbing, cyanosis, edema.  Radials/PT 2+ and equal bilaterally.  Respiratory:  Respirations regular and unlabored, clear to auscultation bilaterally. GI: Soft, nontender, nondistended. MS: No deformity or atrophy. Skin: Warm and dry, no rash. Neuro:  Strength and sensation are intact. Psych: Normal affect.  Assessment & Plan    CAD - Stable with no anginal symptoms. No indication for ischemic evaluation.  GDMT includes aspirin for 1 year along with his Xarelto.  Denies bleeding complications.  Additional GDMT includes Imdur which we will increase to 30 mg daily today due to hypertension and atorvastatin 80 mg daily.  No beta-blocker due to baseline bradycardia.  Encouraged to participate in cardiac rehab.  Plans to participate in [facility]. Heart healthy diet and regular  cardiovascular exercise encouraged.    HLD, LDL goal <70 -lipid panel during admission with LDL 113.  He was started on atorvastatin 80 mg daily.  Tolerating well with no myalgias.  Plan for repeat lipid/c-Met in 6 weeks.  If LDL above goal at that time plan to add Zetia.  Bradycardia -asymptomatic with no lightheadedness, dizziness, near-syncope, syncope.  Avoid AV nodal blocking agents.  Continue to monitor with periodic EKG.  History of unprovoked DVT/PE s/p IVC filter with removal in 2015 / Chronic anticoagulation - continue chronic anticoagulation on Xarelto 20 mg daily.  Swallowing difficulty - Notes sensation of food being "stuck" in throat every once in awhile. May be symptom of untreated GERD. Start Protonix 33m daily. If symptoms persist, encouraged to follow up with primary care.   HTN - BP elevated with systolic in the 1893Yat home and in clinic.  His primary care recently resumed triamterene-HCTZ.  We will increase his Imdur to 30 mg daily today.  He will continue to monitor blood pressure at home and report BP consistently greater than 130/80.  Disposition: Follow up in 3 month(s) with Dr. HPercival Spanishor APP.  Signed, CLoel Dubonnet NP 05/10/2021, 8:30 PM [place]

## 2021-05-10 NOTE — Patient Instructions (Addendum)
Medication Instructions:  Your physician has recommended you make the following change in your medication:    START Protonix one '20mg'$  tablet daily  CHANGE Isosorbide Mononitrate to one '30mg'$  tablet daily  *If you need a refill on your cardiac medications before your next appointment, please call your pharmacy*  Lab Work: Your physician recommends that you return for lab work in 6 weeks at [facility] for fasting lipid panel and CMP  If you have labs (blood work) drawn today and your tests are completely normal, you will receive your results only by: [place] (if you have [place]) OR A paper copy in the mail If you have any lab test that is abnormal or we need to change your treatment, we will call you to review the results.   Testing/Procedures: None ordered today.    Follow-Up: At [facility], you and your health needs are our priority.  As part of our continuing mission to provide you with exceptional heart care, we have created designated Provider Care Teams.  These Care Teams include your primary Cardiologist (physician) and Advanced Practice Providers (APPs -  Physician Assistants and Nurse Practitioners) who all work together to provide you with the care you need, when you need it.  We recommend signing up for the patient portal called "MyChart".  Sign up information is provided on this After Visit Summary.  MyChart is used to connect with patients for Virtual Visits (Telemedicine).  Patients are able to view lab/test results, encounter notes, upcoming appointments, etc.  Non-urgent messages can be sent to your provider as well.   To learn more about what you can do with MyChart, go to [email or web address].    Your next appointment:   3 month(s) with Dr. Percival Spanish in [place] on Wednesday, October 26 @ 11:40.   The format for your next appointment:   In Person  Provider:   Dr. Percival Spanish or APP (Patient prefers [place] if possible)  Other Instructions  Heart Healthy  Diet Recommendations: A low-salt diet is recommended. Meats should be grilled, baked, or boiled. Avoid fried foods. Focus on lean protein sources like fish or chicken with vegetables and fruits. The American Heart Association is a Microbiologist!    Exercise recommendations: The American Heart Association recommends 150 minutes of moderate intensity exercise weekly. Try 30 minutes of moderate intensity exercise 4-5 times per week. This could include walking, jogging, or swimming.   You have been referred to cardiac rehab. This is a combination program including monitored exercise, dietary education, and support group. We strongly recommend participating in the program. Expect a phone call from them in approximately 2-3 weeks.     Heart-Healthy Eating Plan Heart-healthy meal planning includes: Eating less unhealthy fats. Eating more healthy fats. Making other changes in your diet. What are tips for following this plan? Cooking Avoid frying your food. Try to bake, boil, grill, or broil it instead. You can also reduce fat by: Removing the skin from poultry. Removing all visible fats from meats. Steaming vegetables in water or broth. Meal planning  At meals, divide your plate into four equal parts: Fill one-half of your plate with vegetables and green salads. Fill one-fourth of your plate with whole grains. Fill one-fourth of your plate with lean protein foods. Eat 4-5 servings of vegetables per day. A serving of vegetables is: 1 cup of raw or cooked vegetables. 2 cups of raw leafy greens. Eat 4-5 servings of fruit per day. A serving of fruit is: 1 medium whole fruit.  cup of dried fruit.  cup of fresh, frozen, or canned fruit.  cup of 100% fruit juice. Eat more foods that have soluble fiber. These are apples, broccoli, carrots, beans, peas, and barley. Try to get 20-30 g of fiber per day. Eat 4-5 servings of nuts, legumes, and seeds per week: 1 serving of dried beans or legumes  equals  cup after being cooked. 1 serving of nuts is  cup. 1 serving of seeds equals 1 tablespoon.  General information Eat more home-cooked food. Eat less restaurant, buffet, and fast food. Limit or avoid alcohol. Limit foods that are high in starch and sugar. Avoid fried foods. Lose weight if you are overweight. Keep track of how much salt (sodium) you eat. This is important if you have high blood pressure. Ask your doctor to tell you more about this. Try to add vegetarian meals each week. Fats Choose healthy fats. These include olive oil and canola oil, flaxseeds, walnuts, almonds, and seeds. Eat more omega-3 fats. These include salmon, mackerel, sardines, tuna, flaxseed oil, and ground flaxseeds. Try to eat fish at least 2 times each week. Check food labels. Avoid foods with trans fats or high amounts of saturated fat. Limit saturated fats. These are often found in animal products, such as meats, butter, and cream. These are also found in plant foods, such as palm oil, palm kernel oil, and coconut oil. Avoid foods with partially hydrogenated oils in them. These have trans fats. Examples are stick margarine, some tub margarines, cookies, crackers, and other baked goods. What foods can I eat? Fruits All fresh, canned (in natural juice), or frozen fruits. Vegetables Fresh or frozen vegetables (raw, steamed, roasted, or grilled). Green salads. Grains Most grains. Choose whole wheat and whole grains most of the time. Rice andpasta, including brown rice and pastas made with whole wheat. Meats and other proteins Lean, well-trimmed beef, veal, pork, and lamb. Chicken and [place] without skin. All fish and shellfish. Wild duck, rabbit, pheasant, and venison. Egg whites or low-cholesterol egg substitutes. Dried beans, peas, lentils, and tofu. Seedsand most nuts. Dairy Low-fat or nonfat cheeses, including ricotta and mozzarella. Skim or 1% milk that is liquid, powdered, or evaporated.  Buttermilk that is made with low-fatmilk. Nonfat or low-fat yogurt. Fats and oils Non-hydrogenated (trans-free) margarines. Vegetable oils, including soybean, sesame, sunflower, olive, peanut, safflower, corn, canola, and cottonseed. Salad dressings or mayonnaisemade with a vegetable oil. Beverages Mineral water. Coffee and tea. Diet carbonated beverages. Sweets and desserts Sherbet, gelatin, and fruit ice. Small amounts of dark chocolate. Limit all sweets and desserts. Seasonings and condiments All seasonings and condiments. The items listed above may not be a complete list of foods and drinks you can eat. Contact a dietitian for more options. What foods should I avoid? Fruits Canned fruit in heavy syrup. Fruit in cream or butter sauce. Fried fruit. Limitcoconut. Vegetables Vegetables cooked in cheese, cream, or butter sauce. Fried vegetables. Grains Breads that are made with saturated or trans fats, oils, or whole milk. Croissants. Sweet rolls. Donuts. High-fat crackers,such as cheese crackers. Meats and other proteins Fatty meats, such as hot dogs, ribs, sausage, bacon, rib-eye roast or steak. High-fat deli meats, such as salami and bologna. Caviar. Domestic duck andgoose. Organ meats, such as liver. Dairy Cream, sour cream, cream cheese, and creamed cottage cheese. Whole-milk cheeses. Whole or 2% milk that is liquid, evaporated, or condensed. Whole buttermilk. Cream sauce or high-fat cheese sauce. Yogurt that is made fromwhole milk. Fats and oils Meat fat, or  shortening. Cocoa butter, hydrogenated oils, palm oil, coconut oil, palm kernel oil. Solid fats and shortenings, including bacon fat, salt pork, lard, and butter. Nondairy cream substitutes. Salad dressings with cheeseor sour cream. Beverages Regular sodas and juice drinks with added sugar. Sweets and desserts Frosting. Pudding. Cookies. Cakes. Pies. Milk chocolate or white chocolate.Buttered syrups. Full-fat ice cream or ice  cream drinks. The items listed above may not be a complete list of foods and drinks to avoid. Contact a dietitian for more information. Summary Heart-healthy meal planning includes eating less unhealthy fats, eating more healthy fats, and making other changes in your diet. Eat a balanced diet. This includes fruits and vegetables, low-fat or nonfat dairy, lean protein, nuts and legumes, whole grains, and heart-healthy oils and fats. This information is not intended to replace advice given to you by your health care provider. Make sure you discuss any questions you have with your healthcare provider. Document Revised: 12/06/2017 Document Reviewed: 11/09/2017 Elsevier Patient Education  2022 [employer].

## 2021-05-19 ENCOUNTER — Telehealth: Payer: Self-pay | Admitting: Cardiology

## 2021-05-19 NOTE — Telephone Encounter (Signed)
[id number] 

## 2021-05-19 NOTE — Telephone Encounter (Signed)
Pt is calling to go over medications that he was recently put on

## 2021-05-20 NOTE — Telephone Encounter (Signed)
Pt is returning call.  

## 2021-05-20 NOTE — Telephone Encounter (Signed)
Left message for patient to call back  

## 2021-05-30 NOTE — Telephone Encounter (Signed)
Called patient about a phone message from 11 days ago.Stated he wanted to let Laurann Montana NP know the pantoprazole 20 mg she prescribed is not helping his acid reflux.Advised I will send message to her for advice.

## 2021-05-30 NOTE — Telephone Encounter (Signed)
He may increase to Pantoprazole '40mg'$  daily and add Famotidine '20mg'$  (over the counter Pepcid) 20 mg as needed for breakthrough indigestion. He should schedule follow up with his primary care provider regarding indigestion.   Loel Dubonnet, NP

## 2021-05-31 ENCOUNTER — Other Ambulatory Visit: Payer: Self-pay

## 2021-05-31 MED ORDER — FAMOTIDINE 20 MG PO TABS: ORAL_TABLET | ORAL | 6 refills | Status: DC

## 2021-05-31 MED ORDER — PANTOPRAZOLE SODIUM 40 MG PO TBEC: 40.0000 mg | DELAYED_RELEASE_TABLET | Freq: Every day | ORAL | 6 refills | Status: AC

## 2021-05-31 NOTE — Telephone Encounter (Signed)
Spoke to patient George Montana NP's advice given.

## 2021-05-31 NOTE — Progress Notes (Signed)
OTC Pep

## 2021-06-24 ENCOUNTER — Other Ambulatory Visit: Payer: Self-pay

## 2021-06-24 DIAGNOSIS — I25118 Atherosclerotic heart disease of native coronary artery with other forms of angina pectoris: Secondary | ICD-10-CM

## 2021-06-24 DIAGNOSIS — E7800 Pure hypercholesterolemia, unspecified: Secondary | ICD-10-CM

## 2021-06-24 MED ORDER — ATORVASTATIN CALCIUM 80 MG PO TABS: 80.0000 mg | ORAL_TABLET | Freq: Every day | ORAL | 2 refills | Status: AC

## 2021-06-24 NOTE — Telephone Encounter (Signed)
Rreceived fax from [facility] [place] [facility] asking to rx for 100 day for better compliance. OK per Sande Rives, PA-C FORWARDED TO PHARMACY

## 2021-08-09 DIAGNOSIS — R001 Bradycardia, unspecified: Secondary | ICD-10-CM | POA: Insufficient documentation

## 2021-08-09 NOTE — Progress Notes (Signed)
Cardiology Office Note   Date:  08/10/2021   ID:  George Matthews, DOB [birth year], MRN [id number]  PCP:  Adaline Sill, NP  Cardiologist:   Minus Breeding, MD [place]  Chief Complaint  Patient presents with   Coronary Artery Disease      History of Present Illness: George Matthews is a 70 y.o. male who presents for follow up of CAD.    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  He had cardiac catheterization April 26, 2021 showing single-vessel occlusive CAD with 100% stenosis of distal RCA with left to right collaterals. Otherwise nonobstructive disease.    Since he was last seen he has done okay.  Pushes a lawnmower while riding lawnmower.  He denies any of the arm pain that was his previous angina.  He has no new shortness of breath, PND or orthopnea.  He has no palpitations, presyncope or syncope.  He said no weight gain or edema.   Past Medical History:  Diagnosis Date   Bradycardia    CAD (coronary artery disease)    Chronic anticoagulation    DJD (degenerative joint disease)    DVT (deep venous thrombosis) ([facility]) 06/25/2014   ED (erectile dysfunction)    HTN (hypertension)    Polio    right leg/arm weakness   Pulmonary embolism ([facility]) 06/25/2014   Somatic dysfunction    Stroke [facility])    affected vision and speech mildly   Tubular adenoma of colon 2002    Past Surgical History:  Procedure Laterality Date   COLONOSCOPY  2002   Dr. Rehman--> Single diverticulum at the splenic flexure, tiny polyp at transverse colon, tubular adenomatous polyp.   COLONOSCOPY  08/23/2012   Procedure: COLONOSCOPY;  Surgeon: Danie Binder, MD;  [facility];  Service: Endoscopy;  Laterality: N/A;  10:15   COLONOSCOPY WITH PROPOFOL N/A 10/01/2020   Procedure: COLONOSCOPY WITH PROPOFOL;  Surgeon: Harvel Quale, MD;  Location: [facility];  Service: Gastroenterology;  Laterality: N/A;  7:30 am    LEFT HEART CATH AND CORONARY ANGIOGRAPHY N/A 04/26/2021   Procedure: LEFT HEART CATH AND CORONARY ANGIOGRAPHY;  Surgeon: Martinique, Peter M, MD;  Location: [facility] CV [facility];  Service: Cardiovascular;  Laterality: N/A;   POLYPECTOMY  10/01/2020   Procedure: POLYPECTOMY;  Surgeon: Montez Morita, Quillian Quince, MD;  Location: [facility];  Service: Gastroenterology;;     Current Outpatient Medications  Medication Sig Dispense Refill   allopurinol (ZYLOPRIM) 300 MG tablet Take 300 mg by mouth daily.      ergocalciferol (VITAMIN D2) 50000 UNITS capsule Take 50,000 Units by mouth 2 (two) times a week.     famotidine (PEPCID) 20 MG tablet Take 20 mg daily if needed for break through indigestion 30 tablet 6   Garlic 944 MG TABS Take 100 mg by mouth daily.     isosorbide mononitrate (IMDUR) 30 MG 24 hr tablet Take 1 tablet (30 mg total) by mouth daily. 90 tablet 3   nitroGLYCERIN (NITROSTAT) 0.4 MG SL tablet Place 1 tablet (0.4 mg total) under the tongue every 5 (five) minutes x 3 doses as needed for chest pain. 25 tablet 2   Omega-3 Fatty Acids (FISH OIL) 1000 MG CAPS Take 1,000 mg by mouth daily.     rivaroxaban (XARELTO) 20 MG TABS tablet Take 20 mg by mouth daily with supper.     telmisartan-hydrochlorothiazide (MICARDIS HCT) 80-12.5  MG tablet Take 1 tablet by mouth daily.     acetaminophen (TYLENOL) 500 MG tablet Take 500 mg by mouth every 6 (six) hours as needed for mild pain or moderate pain.     ascorbic acid (VITAMIN C) 500 MG tablet Take 500 mg by mouth daily.     aspirin EC 81 MG EC tablet Take 1 tablet (81 mg total) by mouth daily. Swallow whole. 30 tablet 11   atorvastatin (LIPITOR) 80 MG tablet Take 1 tablet (80 mg total) by mouth daily. 100 tablet 2   meclizine (ANTIVERT) 25 MG tablet Take 1 tablet (25 mg total) by mouth 3 (three) times daily as needed for dizziness. 30 tablet 0   pantoprazole (PROTONIX) 40 MG tablet Take 1 tablet (40 mg total) by mouth daily. (Patient not taking: Reported  on 08/10/2021) 30 tablet 6   No current facility-administered medications for this visit.    Allergies:   Codeine    ROS:  Please see the history of present illness.   Otherwise, review of systems are positive for none.   All other systems are reviewed and negative.    PHYSICAL EXAM: VS:  BP (!) 148/78   Pulse 64   Ht 6' 3.5" (1.918 m)   Wt 244 lb (110.7 kg)   BMI 30.10 kg/m  , BMI Body mass index is 30.1 kg/m. GENERAL:  Well appearing NECK:  No jugular venous distention, waveform within normal limits, carotid upstroke brisk and symmetric, no bruits, no thyromegaly LUNGS:  Clear to auscultation bilaterally CHEST:  Unremarkable HEART:  PMI not displaced or sustained,S1 and S2 within normal limits, no S3, no S4, no clicks, no rubs, no murmurs ABD:  Flat, positive bowel sounds normal in frequency in pitch, no bruits, no rebound, no guarding, no midline pulsatile mass, no hepatomegaly, no splenomegaly EXT:  2 plus pulses throughout, no edema, no cyanosis no clubbing   EKG:  EKG is not ordered today.   Recent Labs: 04/26/2021: B Natriuretic Peptide 128.0; TSH 1.286 04/27/2021: BUN 12; Creatinine, Ser 1.13; Hemoglobin 11.3; Platelets 260; Potassium 3.8; Sodium 135    Lipid Panel    Component Value Date/Time   CHOL 158 04/26/2021 0012   TRIG 33 04/26/2021 0012   HDL 38 (L) 04/26/2021 0012   CHOLHDL 4.2 04/26/2021 0012   VLDL 7 04/26/2021 0012   LDLCALC 113 (H) 04/26/2021 0012      Wt Readings from Last 3 Encounters:  08/10/21 244 lb (110.7 kg)  05/10/21 250 lb (113.4 kg)  04/27/21 259 lb (117.5 kg)      Other studies Reviewed: Additional studies/ records that were reviewed today include: Labs. Review of the above records demonstrates:  Please see elsewhere in the note.     ASSESSMENT AND PLAN:   CAD:   The patient has no new sypmtoms.  No further cardiovascular testing is indicated.  We will continue with aggressive risk reduction and meds as  listed.  Dyslipidemia: His LDL is not at target.  Thinks he is itching on the Lipitor and so this is being stopped and he says that Adaline Sill, NP is going to manage him with another statin with a goal LDL less than 70.  I will defer to his management.   History of unprovoked DVT/PE s/p IVC filter with removal in 2015: We gave him some patient assistance forms for the Xarelto.   HTN: The blood pressure is at target.  No change in therapy.   Current medicines are  reviewed at length with the patient today.  The patient does not have concerns regarding medicines.  The following changes have been made:  no change  Labs/ tests ordered today include: None No orders of the defined types were placed in this encounter.    Disposition:   FU with 12 months   Signed, Minus Breeding, MD  08/10/2021 12:41 PM    [place]

## 2021-08-10 ENCOUNTER — Ambulatory Visit (INDEPENDENT_AMBULATORY_CARE_PROVIDER_SITE_OTHER): Payer: Medicare HMO | Admitting: Cardiology

## 2021-08-10 ENCOUNTER — Other Ambulatory Visit: Payer: Self-pay

## 2021-08-10 ENCOUNTER — Encounter: Payer: Self-pay | Admitting: Cardiology

## 2021-08-10 VITALS — BP 148/78 | HR 64 | Ht 75.5 in | Wt 244.0 lb

## 2021-08-10 DIAGNOSIS — R001 Bradycardia, unspecified: Secondary | ICD-10-CM | POA: Diagnosis not present

## 2021-08-10 DIAGNOSIS — I10 Essential (primary) hypertension: Secondary | ICD-10-CM

## 2021-08-10 DIAGNOSIS — E785 Hyperlipidemia, unspecified: Secondary | ICD-10-CM | POA: Diagnosis not present

## 2021-08-10 DIAGNOSIS — I2510 Atherosclerotic heart disease of native coronary artery without angina pectoris: Secondary | ICD-10-CM | POA: Diagnosis not present

## 2021-08-10 NOTE — Patient Instructions (Signed)
Medication Instructions:  The current medical regimen is effective;  continue present plan and medications.  *If you need a refill on your cardiac medications before your next appointment, please call your pharmacy*  Follow-Up: At [facility], you and your health needs are our priority.  As part of our continuing mission to provide you with exceptional heart care, we have created designated Provider Care Teams.  These Care Teams include your primary Cardiologist (physician) and Advanced Practice Providers (APPs -  Physician Assistants and Nurse Practitioners) who all work together to provide you with the care you need, when you need it.  We recommend signing up for the patient portal called "MyChart".  Sign up information is provided on this After Visit Summary.  MyChart is used to connect with patients for Virtual Visits (Telemedicine).  Patients are able to view lab/test results, encounter notes, upcoming appointments, etc.  Non-urgent messages can be sent to your provider as well.   To learn more about what you can do with MyChart, go to [email or web address].    Your next appointment:   1 year(s)  The format for your next appointment:   In Person  Provider:   Fermon Hochrein, MD   Thank you for choosing [facility]!!    

## 2022-05-18 ENCOUNTER — Other Ambulatory Visit (HOSPITAL_BASED_OUTPATIENT_CLINIC_OR_DEPARTMENT_OTHER): Payer: Self-pay | Admitting: Family

## 2022-05-18 NOTE — Telephone Encounter (Signed)
Rx(s) sent to pharmacy electronically.  

## 2022-06-19 ENCOUNTER — Other Ambulatory Visit: Payer: Self-pay | Admitting: Family

## 2022-06-20 NOTE — Telephone Encounter (Signed)
Pt of Dr. Percival Spanish. Please review for refill. Thank you!

## 2022-08-18 ENCOUNTER — Other Ambulatory Visit (HOSPITAL_BASED_OUTPATIENT_CLINIC_OR_DEPARTMENT_OTHER): Payer: Self-pay | Admitting: Cardiology

## 2022-11-19 ENCOUNTER — Other Ambulatory Visit (HOSPITAL_BASED_OUTPATIENT_CLINIC_OR_DEPARTMENT_OTHER): Payer: Self-pay | Admitting: Cardiology

## 2023-01-16 ENCOUNTER — Other Ambulatory Visit (HOSPITAL_BASED_OUTPATIENT_CLINIC_OR_DEPARTMENT_OTHER): Payer: Self-pay | Admitting: Cardiology

## 2023-03-02 ENCOUNTER — Emergency Department (HOSPITAL_COMMUNITY)
Admission: EM | Admit: 2023-03-02 | Discharge: 2023-03-02 | Disposition: A | Discharge status: Home / Self Care | Payer: Medicare HMO | Attending: Emergency Medicine | Admitting: Emergency Medicine

## 2023-03-02 ENCOUNTER — Encounter (HOSPITAL_COMMUNITY): Payer: Self-pay | Admitting: *Deleted

## 2023-03-02 ENCOUNTER — Other Ambulatory Visit: Payer: Self-pay

## 2023-03-02 DIAGNOSIS — I10 Essential (primary) hypertension: Secondary | ICD-10-CM | POA: Insufficient documentation

## 2023-03-02 DIAGNOSIS — Z7982 Long term (current) use of aspirin: Secondary | ICD-10-CM | POA: Insufficient documentation

## 2023-03-02 DIAGNOSIS — R531 Weakness: Secondary | ICD-10-CM | POA: Insufficient documentation

## 2023-03-02 DIAGNOSIS — Z7901 Long term (current) use of anticoagulants: Secondary | ICD-10-CM | POA: Diagnosis not present

## 2023-03-02 DIAGNOSIS — R42 Dizziness and giddiness: Secondary | ICD-10-CM | POA: Diagnosis present

## 2023-03-02 DIAGNOSIS — Z79899 Other long term (current) drug therapy: Secondary | ICD-10-CM | POA: Diagnosis not present

## 2023-03-02 LAB — CBC WITH DIFFERENTIAL/PLATELET
Abs Immature Granulocytes: 0.01 10*3/uL (ref 0.00–0.07)
Basophils Absolute: 0.1 10*3/uL (ref 0.0–0.1)
Basophils Relative: 1 %
Eosinophils Absolute: 0.6 10*3/uL — ABNORMAL HIGH (ref 0.0–0.5)
Eosinophils Relative: 12 %
HCT: 44.6 % (ref 39.0–52.0)
Hemoglobin: 14.3 g/dL (ref 13.0–17.0)
Immature Granulocytes: 0 %
Lymphocytes Relative: 32 %
Lymphs Abs: 1.6 10*3/uL (ref 0.7–4.0)
MCH: 28 pg (ref 26.0–34.0)
MCHC: 32.1 g/dL (ref 30.0–36.0)
MCV: 87.5 fL (ref 80.0–100.0)
Monocytes Absolute: 0.4 10*3/uL (ref 0.1–1.0)
Monocytes Relative: 8 %
Neutro Abs: 2.3 10*3/uL (ref 1.7–7.7)
Neutrophils Relative %: 47 %
Platelets: 269 10*3/uL (ref 150–400)
RBC: 5.1 MIL/uL (ref 4.22–5.81)
RDW: 16.2 % — ABNORMAL HIGH (ref 11.5–15.5)
WBC: 4.9 10*3/uL (ref 4.0–10.5)
nRBC: 0 % (ref 0.0–0.2)

## 2023-03-02 LAB — COMPREHENSIVE METABOLIC PANEL
ALT: 12 U/L (ref 0–44)
AST: 22 U/L (ref 15–41)
Albumin: 3.6 g/dL (ref 3.5–5.0)
Alkaline Phosphatase: 57 U/L (ref 38–126)
Anion gap: 7 (ref 5–15)
BUN: 16 mg/dL (ref 8–23)
CO2: 29 mmol/L (ref 22–32)
Calcium: 8.9 mg/dL (ref 8.9–10.3)
Chloride: 102 mmol/L (ref 98–111)
Creatinine, Ser: 1.02 mg/dL (ref 0.61–1.24)
GFR, Estimated: >60 mL/min (ref >60)
Glucose, Bld: 101 mg/dL — ABNORMAL HIGH (ref 70–99)
Potassium: 4.2 mmol/L (ref 3.5–5.1)
Sodium: 138 mmol/L (ref 135–145)
Total Bilirubin: 0.8 mg/dL (ref 0.3–1.2)
Total Protein: 7.3 g/dL (ref 6.5–8.1)

## 2023-03-02 MED ORDER — MECLIZINE HCL 25 MG PO TABS: 25.0000 mg | ORAL_TABLET | Freq: Three times a day (TID) | ORAL | 0 refills | Status: AC | PRN

## 2023-03-02 MED ORDER — ONDANSETRON 4 MG PO TBDP: ORAL_TABLET | ORAL | 0 refills | Status: DC

## 2023-03-02 MED ORDER — ONDANSETRON HCL 4 MG/2ML IJ SOLN
4.0000 mg | Freq: Once | INTRAMUSCULAR | Status: AC
  Administered 2023-03-02: 4 mg via INTRAVENOUS
  Filled 2023-03-02: qty 2

## 2023-03-02 MED ORDER — SODIUM CHLORIDE 0.9 % IV BOLUS
500.0000 mL | Freq: Once | INTRAVENOUS | Status: AC
  Administered 2023-03-02: 500 mL via INTRAVENOUS

## 2023-03-02 MED ORDER — MECLIZINE HCL 12.5 MG PO TABS
25.0000 mg | ORAL_TABLET | Freq: Once | ORAL | Status: AC
  Administered 2023-03-02: 25 mg via ORAL
  Filled 2023-03-02: qty 2

## 2023-03-02 MED ORDER — DIAZEPAM 5 MG/ML IJ SOLN
5.0000 mg | Freq: Once | INTRAMUSCULAR | Status: AC
  Administered 2023-03-02: 5 mg via INTRAVENOUS
  Filled 2023-03-02: qty 2

## 2023-03-02 NOTE — Discharge Instructions (Signed)
Drink plenty of fluids.  Rest this weekend.  Follow-up with your doctor next week for recheck.  Take the medicine if needed

## 2023-03-02 NOTE — ED Triage Notes (Signed)
Pt BIB [facility] for c/o dizziness that has since resolved  Pt state he woke up at 5am and was feeling normal. He went back to bed and woke up at 0645 and states he felt dizzy and nauseous  Cbg 112 BP 162/76 HR 57  Pt recently had a chest infection and has been taking amoxicillin  Pt's spouse told ems his urine has been dark in color  Pt denies any pain, dizziness or weakness at this time

## 2023-03-02 NOTE — ED Provider Notes (Signed)
[place] EMERGENCY DEPARTMENT AT [facility] Provider Note   CSN: [id number] Arrival date & time: 03/02/23  4098     History {Add pertinent medical, surgical, social history, OB history to HPI:1} Chief Complaint  Patient presents with   Weakness    George Matthews is a 72 y.o. male.  Patient has a history of hypertension and he has had a PE before.  Patient states he woke up this morning and he felt like the room was spinning   Weakness      Home Medications Prior to Admission medications   Medication Sig Start Date End Date Taking? Authorizing Provider  meclizine (ANTIVERT) 25 MG tablet Take 1 tablet (25 mg total) by mouth 3 (three) times daily as needed for dizziness. 03/02/23  Yes Bethann Berkshire, MD  ondansetron (ZOFRAN-ODT) 4 MG disintegrating tablet 4mg  ODT q4 hours prn nausea/vomit 03/02/23  Yes Bethann Berkshire, MD  acetaminophen (TYLENOL) 500 MG tablet Take 500 mg by mouth every 6 (six) hours as needed for mild pain or moderate pain.    [provider]  allopurinol (ZYLOPRIM) 300 MG tablet Take 300 mg by mouth daily.     [provider]  ascorbic acid (VITAMIN C) 500 MG tablet Take 500 mg by mouth daily.    [provider]  aspirin EC 81 MG EC tablet Take 1 tablet (81 mg total) by mouth daily. Swallow whole. 04/27/21   Marjie Skiff E, PA-C  atorvastatin (LIPITOR) 80 MG tablet Take 1 tablet (80 mg total) by mouth daily. 06/24/21   Marjie Skiff E, PA-C  ergocalciferol (VITAMIN D2) 50000 UNITS capsule Take 50,000 Units by mouth 2 (two) times a week.    [provider]  famotidine (PEPCID) 20 MG tablet TAKE 1 TABLET (20 MG) DAILY IF NEEDED FOR BREAK THROUGH INDIGESTION 06/22/22   [name], MD  Garlic 100 MG TABS Take 100 mg by mouth daily.    [provider]  isosorbide mononitrate (IMDUR) 30 MG 24 hr tablet Take 1 tablet (30 mg total) by mouth daily. PATIENT MUST SCHEDULE APPOINTMENT FOR FUTURE REFILLS FIRST  ATTEMPT 01/16/23   [name], MD  nitroGLYCERIN (NITROSTAT) 0.4 MG SL tablet Place 1 tablet (0.4 mg total) under the tongue every 5 (five) minutes x 3 doses as needed for chest pain. 04/27/21   Corrin Parker, PA-C  Omega-3 Fatty Acids (FISH OIL) 1000 MG CAPS Take 1,000 mg by mouth daily.    [provider]  pantoprazole (PROTONIX) 40 MG tablet Take 1 tablet (40 mg total) by mouth daily. Patient not taking: Reported on 08/10/2021 05/31/21   Alver Sorrow, NP  rivaroxaban (XARELTO) 20 MG TABS tablet Take 20 mg by mouth daily with supper.    [provider]  telmisartan-hydrochlorothiazide (MICARDIS HCT) 80-12.5 MG tablet Take 1 tablet by mouth daily.    [provider]      Allergies    Codeine    Review of Systems   Review of Systems  Neurological:  Positive for weakness.    Physical Exam Updated Vital Signs BP (!) 181/78   Pulse (!) 51   Temp 97.7 F (36.5 C) (Oral)   Resp 15   Ht 6' 3.5" (1.918 m)   Wt 107 kg   SpO2 97%   BMI 29.11 kg/m  Physical Exam  ED Results / Procedures / Treatments   Labs (all labs ordered are listed, but only abnormal results are displayed) Labs Reviewed  CBC WITH  DIFFERENTIAL/PLATELET - Abnormal; Notable for the following components:      Result Value   RDW 16.2 (*)    Eosinophils Absolute 0.6 (*)    All other components within normal limits  COMPREHENSIVE METABOLIC PANEL - Abnormal; Notable for the following components:   Glucose, Bld 101 (*)    All other components within normal limits    EKG None  Radiology No results found.  Procedures Procedures  {Document cardiac monitor, telemetry assessment procedure when appropriate:1}  Medications Ordered in ED Medications  ondansetron (ZOFRAN) injection 4 mg (4 mg Intravenous Given 03/02/23 0843)  diazepam (VALIUM) injection 5 mg (5 mg Intravenous Given 03/02/23 0843)  meclizine (ANTIVERT) tablet 25 mg (25 mg Oral Given 03/02/23 0844)  sodium chloride  0.9 % bolus 500 mL (0 mLs Intravenous Stopped 03/02/23 1002)    ED Course/ Medical Decision Making/ A&P  Patient improved with Valium and Antivert.  Patient no longer has vertigo symptoms. {   Click here for ABCD2, HEART and other calculatorsREFRESH Note before signing :1}                          Medical Decision Making Amount and/or Complexity of Data Reviewed Labs: ordered. ECG/medicine tests: ordered.  Risk Prescription drug management.   Patient with vertigo that improved with Valium and Antivert.  He will be discharged home with Antivert and follow-up with his PCP  {Document critical care time when appropriate:1} {Document review of labs and clinical decision tools ie heart score, Chads2Vasc2 etc:1}  {Document your independent review of radiology images, and any outside records:1} {Document your discussion with family members, caretakers, and with consultants:1} {Document social determinants of health affecting pt's care:1} {Document your decision making why or why not admission, treatments were needed:1} Final Clinical Impression(s) / ED Diagnoses Final diagnoses:  Vertigo    Rx / DC Orders ED Discharge Orders          Ordered    meclizine (ANTIVERT) 25 MG tablet  3 times daily PRN        03/02/23 1047    ondansetron (ZOFRAN-ODT) 4 MG disintegrating tablet        03/02/23 1047

## 2023-04-25 ENCOUNTER — Encounter: Payer: Self-pay | Admitting: Urology

## 2023-04-25 ENCOUNTER — Ambulatory Visit: Payer: Medicare HMO | Admitting: Urology

## 2023-04-25 VITALS — BP 123/65 | HR 79

## 2023-04-25 DIAGNOSIS — R972 Elevated prostate specific antigen [PSA]: Secondary | ICD-10-CM

## 2023-04-25 MED ORDER — LEVOFLOXACIN 750 MG PO TABS: 750.0000 mg | ORAL_TABLET | Freq: Once | ORAL | 0 refills | Status: AC

## 2023-04-25 NOTE — Progress Notes (Signed)
04/25/2023 11:56 AM   George Matthews June 27, 1951 [id number]  Referring provider: [name], NP [address] [place],  [place] [address]  Elevated PSA   HPI: [name] is a 72yo here for evaluation of elevated PSA. PSA was 10.4 in 01/2023 and on recheck was 11.0. IPSS 3 QOL 1 on no BPH medications. No dysuria. Urine stream strong. No straining to urinate.  His father had prostate diagnosed when he was in his 5s. He is xarelto for hx of PE 3-4 years ago.    PMH: Past Medical History:  Diagnosis Date   Bradycardia    CAD (coronary artery disease)    Chronic anticoagulation    DJD (degenerative joint disease)    DVT (deep venous thrombosis) ([facility]) 06/25/2014   ED (erectile dysfunction)    HTN (hypertension)    Polio    right leg/arm weakness   Pulmonary embolism ([facility]) 06/25/2014   Somatic dysfunction    Stroke [facility])    affected vision and speech mildly   Tubular adenoma of colon 2002    Surgical History: Past Surgical History:  Procedure Laterality Date   COLONOSCOPY  2002   Dr. Rehman--> Single diverticulum at the splenic flexure, tiny polyp at transverse colon, tubular adenomatous polyp.   COLONOSCOPY  08/23/2012   Procedure: COLONOSCOPY;  Surgeon: [place], MD;  [facility];  Service: Endoscopy;  Laterality: N/A;  10:15   COLONOSCOPY WITH PROPOFOL N/A 10/01/2020   Procedure: COLONOSCOPY WITH PROPOFOL;  Surgeon: Dolores Frame, MD;  [facility];  Service: Gastroenterology;  Laterality: N/A;  7:30 am   LEFT HEART CATH AND CORONARY ANGIOGRAPHY N/A 04/26/2021   Procedure: LEFT HEART CATH AND CORONARY ANGIOGRAPHY;  Surgeon: [place], Peter M, MD;  Location: [facility] INVASIVE CV LAB;  Service: Cardiovascular;  Laterality: N/A;   POLYPECTOMY  10/01/2020   Procedure: POLYPECTOMY;  Surgeon: Dolores Frame, MD;  [facility];  Service: Gastroenterology;;    Home Medications:  Allergies as of 04/25/2023       Reactions    Codeine Itching        Medication List        Accurate as of April 25, 2023 11:56 AM. If you have any questions, ask your nurse or doctor.          acetaminophen 500 MG tablet Commonly known as: TYLENOL Take 500 mg by mouth every 6 (six) hours as needed for mild pain or moderate pain.   allopurinol 300 MG tablet Commonly known as: ZYLOPRIM Take 300 mg by mouth daily.   ascorbic acid 500 MG tablet Commonly known as: VITAMIN C Take 500 mg by mouth daily.   aspirin EC 81 MG tablet Take 1 tablet (81 mg total) by mouth daily. Swallow whole.   atorvastatin 80 MG tablet Commonly known as: LIPITOR Take 1 tablet (80 mg total) by mouth daily.   ergocalciferol 1.25 MG (50000 UT) capsule Commonly known as: VITAMIN D2 Take 50,000 Units by mouth 2 (two) times a week.   famotidine 20 MG tablet Commonly known as: PEPCID TAKE 1 TABLET (20 MG) DAILY IF NEEDED FOR BREAK THROUGH INDIGESTION   Fish Oil 1000 MG Caps Take 1,000 mg by mouth daily.   Garlic 100 MG Tabs Take 100 mg by mouth daily.   isosorbide mononitrate 30 MG 24 hr tablet Commonly known as: IMDUR Take 1 tablet (30 mg total) by mouth daily. PATIENT MUST SCHEDULE APPOINTMENT FOR FUTURE  REFILLS FIRST ATTEMPT   meclizine 25 MG tablet Commonly known as: ANTIVERT Take 1 tablet (25 mg total) by mouth 3 (three) times daily as needed for dizziness.   nitroGLYCERIN 0.4 MG SL tablet Commonly known as: NITROSTAT Place 1 tablet (0.4 mg total) under the tongue every 5 (five) minutes x 3 doses as needed for chest pain.   ondansetron 4 MG disintegrating tablet Commonly known as: ZOFRAN-ODT 4mg  ODT q4 hours prn nausea/vomit   pantoprazole 40 MG tablet Commonly known as: PROTONIX Take 1 tablet (40 mg total) by mouth daily.   rivaroxaban 20 MG Tabs tablet Commonly known as: XARELTO Take 20 mg by mouth daily with supper.   telmisartan-hydrochlorothiazide 80-12.5 MG tablet Commonly known as: MICARDIS HCT Take 1 tablet  by mouth daily.   traZODone 100 MG tablet Commonly known as: DESYREL 1/2 to 1 tablet at bedtime Orally Once a day for 30 day(s)        Allergies:  Allergies  Allergen Reactions   Codeine Itching    Family History: Family History  Problem Relation Age of Onset   Colon cancer Father        age 27   Heart attack Father        deceased age 77   Liver disease Neg Hx     Social History:  reports that he has quit smoking. His smokeless tobacco use includes chew. He reports that he does not drink alcohol and does not use drugs.  ROS: All other review of systems were reviewed and are negative except what is noted above in HPI  Physical Exam: BP 123/65   Pulse 79   Constitutional:  Alert and oriented, No acute distress. HEENT: [place] AT, moist mucus membranes.  Trachea midline, no masses. Cardiovascular: No clubbing, cyanosis, or edema. Respiratory: Normal respiratory effort, no increased work of breathing. GI: Abdomen is soft, nontender, nondistended, no abdominal masses GU: No CVA tenderness. Circumcised phallus. No masses/lesions on penis, testis, scrotum. Prostate 60g smooth no nodules no induration.  Lymph: No cervical or inguinal lymphadenopathy. Skin: No rashes, bruises or suspicious lesions. Neurologic: Grossly intact, no focal deficits, moving all 4 extremities. Psychiatric: Normal mood and affect.  Laboratory Data: Lab Results  Component Value Date   WBC 4.9 03/02/2023   HGB 14.3 03/02/2023   HCT 44.6 03/02/2023   MCV 87.5 03/02/2023   PLT 269 03/02/2023    Lab Results  Component Value Date   CREATININE 1.02 03/02/2023    No results found for: "PSA"  No results found for: "TESTOSTERONE"  Lab Results  Component Value Date   HGBA1C 6.3 (H) 04/26/2021    Urinalysis    Component Value Date/Time   COLORURINE YELLOW 09/11/2015 1012   APPEARANCEUR CLOUDY (A) 09/11/2015 1012   LABSPEC 1.020 09/11/2015 1012   PHURINE 6.0 09/11/2015 1012   GLUCOSEU  NEGATIVE 09/11/2015 1012   HGBUR LARGE (A) 09/11/2015 1012   BILIRUBINUR NEGATIVE 09/11/2015 1012   KETONESUR NEGATIVE 09/11/2015 1012   PROTEINUR TRACE (A) 09/11/2015 1012   UROBILINOGEN 0.2 04/23/2015 1015   NITRITE POSITIVE (A) 09/11/2015 1012   LEUKOCYTESUR LARGE (A) 09/11/2015 1012    Lab Results  Component Value Date   BACTERIA MANY (A) 09/11/2015    Pertinent Imaging:  No results found for this or any previous visit.  No results found for this or any previous visit.  No results found for this or any previous visit.  No results found for this or any previous visit.  No  results found for this or any previous visit.  No valid procedures specified. No results found for this or any previous visit.  No results found for this or any previous visit.   Assessment & Plan:    1. Elevated PSA The patient and I talked about etiologies of elevated PSA.  We discussed the possible relationship between elevated PSA, prostate cancer, BPH, prostatitis, and UTI.   Conservative treatment of elevated PSA with watchful waiting was discussed with the patient.  All questions were answered.        All of the risks and benefits along with alternatives to prostate biopsy were discussed with the patient.  The patient gave fully informed consent to proceed with a transrectal ultrasound guided biopsy of the prostate for the evaluation of their evated PSA.  Prostate biopsy instructions and antibiotics were given to the patient.    No follow-ups on file.  [name], MD  [facility] [place]

## 2023-04-25 NOTE — Patient Instructions (Addendum)
Appointment Time:12:30 Appointment Date:06/06/23  Location: [facility]   Prostate Biopsy Instructions  Stop all aspirin or blood thinners (aspirin, plavix, coumadin, warfarin, motrin, ibuprofen, advil, aleve, naproxen, naprosyn) for 7 days prior to the procedure.  If you have any questions about stopping these medications, please contact your primary care physician or cardiologist.  Having a light meal prior to the procedure is recommended.  If you are diabetic or have low blood sugar please bring a small snack or glucose tablet.  A Fleets enema is needed to be purchased over the counter at a local pharmacy and used 2 hours before you scheduled appointment.  This can be purchased over the counter at any pharmacy.  Antibiotics will be administered in the clinic at the time of the procedure and 1 tablet has been sent to your pharmacy. Please take the antibiotic as prescribed.    Please bring someone with you to the procedure to drive you home if you are given a valium to take prior to your procedure.   If you have any questions or concerns, please feel free to call the office at [phone] or send a Mychart message.    Thank you, [facility]        Transrectal Ultrasound-Guided Prostate Biopsy, Care After The following information offers guidance on how to care for yourself after your procedure. Your health care provider may also give you more specific instructions. If you have problems or questions, contact your health care provider. What can I expect after the procedure? After the procedure, it is common to have: Pain and discomfort near your rectum, especially while sitting. Pink-colored urine due to small amounts of blood in your urine. A burning feeling while urinating. Blood in your stool (feces) or bleeding from your rectum. Blood in your semen. Follow these instructions at home: Medicines Take over-the-counter and prescription  medicines only as told by your health care provider. If you were given a sedative during your procedure, it can affect you for several hours. Do not drive or operate machinery until your health care provider says that it is safe. If you were prescribed an antibiotic medicine, take it as told by your health care provider. Do not stop using the antibiotic even if you start to feel better. Activity  Return to your normal activities as told by your health care provider. Ask your health care provider what activities are safe for you. Ask your health care provider when it is okay for you to resume sexual activity. You may have to avoid lifting. Ask your health care provider how much you can safely lift. General instructions  Drink enough fluid to keep your urine pale yellow. Watch your urine, stool, and semen for new or increased bleeding. Keep all follow-up visits. This is important. Contact a health care provider if: You have any of the following: Blood clots in your urine or stool. Blood in your urine more than 2 weeks after the procedure. Blood in your semen more than 2 months after the procedure. New or increased bleeding in your urine, stool, or semen. Severe pain in your abdomen. Your urine smells bad or unusual. You have trouble urinating. Your lower abdomen feels firm. You have problems getting an erection. You have nausea or you vomit. Get help right away if: You have a fever or chills. This could be a sign of infection. You have bright red urine. You have severe pain that does not get better with medicine. You cannot  urinate. Summary After this procedure, it is common to have pain and discomfort around your rectum, especially while sitting. You may have blood in your urine and stool after the procedure. It is common to have blood in your semen after this procedure. Get help right away if you have a fever or chills. This could be a sign of infection. This information is not  intended to replace advice given to you by your health care provider. Make sure you discuss any questions you have with your health care provider. Document Revised: 03/28/2021 Document Reviewed: 03/28/2021 Elsevier Patient Education  2024 [employer].

## 2023-05-16 ENCOUNTER — Other Ambulatory Visit: Payer: Medicare HMO | Admitting: Urology

## 2023-05-31 ENCOUNTER — Telehealth: Payer: Self-pay

## 2023-05-31 NOTE — Telephone Encounter (Signed)
Patient called in today about surgery appt and time. [phone] [phone]

## 2023-06-06 ENCOUNTER — Ambulatory Visit (HOSPITAL_COMMUNITY)
Admission: RE | Admit: 2023-06-06 | Discharge: 2023-06-06 | Disposition: A | Discharge status: Home / Self Care | Payer: Medicare HMO | Source: Ambulatory Visit | Attending: Urology | Admitting: Urology

## 2023-06-06 ENCOUNTER — Ambulatory Visit (HOSPITAL_BASED_OUTPATIENT_CLINIC_OR_DEPARTMENT_OTHER): Payer: Medicare HMO | Admitting: Urology

## 2023-06-06 ENCOUNTER — Other Ambulatory Visit: Payer: Self-pay | Admitting: Urology

## 2023-06-06 ENCOUNTER — Encounter (HOSPITAL_COMMUNITY): Payer: Self-pay

## 2023-06-06 VITALS — BP 155/57 | HR 58 | Temp 97.6°F | Resp 18

## 2023-06-06 DIAGNOSIS — C61 Malignant neoplasm of prostate: Secondary | ICD-10-CM | POA: Diagnosis not present

## 2023-06-06 DIAGNOSIS — R972 Elevated prostate specific antigen [PSA]: Secondary | ICD-10-CM

## 2023-06-06 MED ORDER — GENTAMICIN SULFATE 40 MG/ML IJ SOLN
INTRAMUSCULAR | Status: AC
  Filled 2023-06-06: qty 2

## 2023-06-06 MED ORDER — GENTAMICIN SULFATE 40 MG/ML IJ SOLN
80.0000 mg | Freq: Once | INTRAMUSCULAR | Status: AC
  Administered 2023-06-06: 80 mg via INTRAMUSCULAR

## 2023-06-06 MED ORDER — LIDOCAINE HCL (PF) 2 % IJ SOLN
INTRAMUSCULAR | Status: AC
  Filled 2023-06-06: qty 10

## 2023-06-06 MED ORDER — LIDOCAINE HCL (PF) 2 % IJ SOLN
10.0000 mL | Freq: Once | INTRAMUSCULAR | Status: AC
  Administered 2023-06-06: 10 mL

## 2023-06-06 NOTE — Progress Notes (Signed)
PT tolerated prostate biopsy procedure well today. Labs obtained and sent for pathology by Richard from ultrasound. PT ambulatory at discharge with no acute distress noted and verbalized understanding of discharge instructions. PT to follow up with urologist as scheduled on 06/20/23.

## 2023-06-06 NOTE — Progress Notes (Signed)
Prostate Biopsy Procedure   Informed consent was obtained after discussing risks/benefits of the procedure.  A time out was performed to ensure correct patient identity.  Pre-Procedure: - Last PSA Level: No results found for: "PSA" - Gentamicin given prophylactically - Levaquin 500 mg administered PO -Transrectal Ultrasound performed revealing a 57.8 gm prostate -No significant hypoechoic or median lobe noted  Procedure: - Prostate block performed using 10 cc 1% lidocaine and biopsies taken from sextant areas, a total of 12 under ultrasound guidance.  Post-Procedure: - Patient tolerated the procedure well - He was counseled to seek immediate medical attention if experiences any severe pain, significant bleeding, or fevers - Return in one week to discuss biopsy results

## 2023-06-13 ENCOUNTER — Ambulatory Visit: Payer: Medicare HMO | Admitting: Urology

## 2023-06-20 ENCOUNTER — Ambulatory Visit: Payer: Medicare HMO | Admitting: Urology

## 2023-06-20 VITALS — BP 136/66 | HR 73

## 2023-06-20 DIAGNOSIS — R972 Elevated prostate specific antigen [PSA]: Secondary | ICD-10-CM | POA: Diagnosis not present

## 2023-06-20 DIAGNOSIS — C61 Malignant neoplasm of prostate: Secondary | ICD-10-CM

## 2023-06-20 NOTE — Progress Notes (Signed)
06/20/2023 4:23 PM   George Matthews [birth year] [id number]  Referring provider: [name], NP [address] [place],  [place] [address]  Followup prostate biopsy   HPI: [name] is a 72yo here for followup after prostate biopsy. Gleason 3+4=7 in 6/12 cores and Gleason 3+3=6 in 2/12 cores. PSA 11.0   PMH: Past Medical History:  Diagnosis Date   Bradycardia    CAD (coronary artery disease)    Chronic anticoagulation    DJD (degenerative joint disease)    DVT (deep venous thrombosis) (HCC) 06/25/2014   ED (erectile dysfunction)    HTN (hypertension)    Polio    right leg/arm weakness   Pulmonary embolism (HCC) 06/25/2014   Somatic dysfunction    Stroke [facility])    affected vision and speech mildly   Tubular adenoma of colon 2002    Surgical History: Past Surgical History:  Procedure Laterality Date   COLONOSCOPY  2002   Dr. Rehman--> Single diverticulum at the splenic flexure, tiny polyp at transverse colon, tubular adenomatous polyp.   COLONOSCOPY  08/23/2012   Procedure: COLONOSCOPY;  Surgeon: West Bali, MD;  [facility];  Service: Endoscopy;  Laterality: N/A;  10:15   COLONOSCOPY WITH PROPOFOL N/A 10/01/2020   Procedure: COLONOSCOPY WITH PROPOFOL;  Surgeon: Dolores Frame, MD;  [facility];  Service: Gastroenterology;  Laterality: N/A;  7:30 am   LEFT HEART CATH AND CORONARY ANGIOGRAPHY N/A 04/26/2021   Procedure: LEFT HEART CATH AND CORONARY ANGIOGRAPHY;  Surgeon: Swaziland, Peter M, MD;  Location: [facility] INVASIVE CV LAB;  Service: Cardiovascular;  Laterality: N/A;   POLYPECTOMY  10/01/2020   Procedure: POLYPECTOMY;  Surgeon: Dolores Frame, MD;  [facility];  Service: Gastroenterology;;    Home Medications:  Allergies as of 06/20/2023       Reactions   Codeine Itching        Medication List        Accurate as of June 20, 2023  4:23 PM. If you have any questions, ask your nurse or doctor.           acetaminophen 500 MG tablet Commonly known as: TYLENOL Take 500 mg by mouth every 6 (six) hours as needed for mild pain or moderate pain.   allopurinol 300 MG tablet Commonly known as: ZYLOPRIM Take 300 mg by mouth daily.   ascorbic acid 500 MG tablet Commonly known as: VITAMIN C Take 500 mg by mouth daily.   aspirin EC 81 MG tablet Take 1 tablet (81 mg total) by mouth daily. Swallow whole.   atorvastatin 80 MG tablet Commonly known as: LIPITOR Take 1 tablet (80 mg total) by mouth daily.   ergocalciferol 1.25 MG (50000 UT) capsule Commonly known as: VITAMIN D2 Take 50,000 Units by mouth 2 (two) times a week.   famotidine 20 MG tablet Commonly known as: PEPCID TAKE 1 TABLET (20 MG) DAILY IF NEEDED FOR BREAK THROUGH INDIGESTION   Fish Oil 1000 MG Caps Take 1,000 mg by mouth daily.   Garlic 100 MG Tabs Take 100 mg by mouth daily.   isosorbide mononitrate 30 MG 24 hr tablet Commonly known as: IMDUR Take 1 tablet (30 mg total) by mouth daily. PATIENT MUST SCHEDULE APPOINTMENT FOR FUTURE REFILLS FIRST ATTEMPT   meclizine 25 MG tablet Commonly known as: ANTIVERT Take 1 tablet (25 mg total) by mouth 3 (three) times daily as needed for dizziness.   nitroGLYCERIN 0.4 MG SL  tablet Commonly known as: NITROSTAT Place 1 tablet (0.4 mg total) under the tongue every 5 (five) minutes x 3 doses as needed for chest pain.   ondansetron 4 MG disintegrating tablet Commonly known as: ZOFRAN-ODT 4mg  ODT q4 hours prn nausea/vomit   pantoprazole 40 MG tablet Commonly known as: PROTONIX Take 1 tablet (40 mg total) by mouth daily.   rivaroxaban 20 MG Tabs tablet Commonly known as: XARELTO Take 20 mg by mouth daily with supper.   telmisartan-hydrochlorothiazide 80-12.5 MG tablet Commonly known as: MICARDIS HCT Take 1 tablet by mouth daily.   traZODone 100 MG tablet Commonly known as: DESYREL 1/2 to 1 tablet at bedtime Orally Once a day for 30 day(s)        Allergies:   Allergies  Allergen Reactions   Codeine Itching    Family History: Family History  Problem Relation Age of Onset   Colon cancer Father        age 71   Heart attack Father        deceased age 73   Liver disease Neg Hx     Social History:  reports that he has quit smoking. His smokeless tobacco use includes chew. He reports that he does not drink alcohol and does not use drugs.  ROS: All other review of systems were reviewed and are negative except what is noted above in HPI  Physical Exam: BP 136/66   Pulse 73   Constitutional:  Alert and oriented, No acute distress. HEENT:  AT, moist mucus membranes.  Trachea midline, no masses. Cardiovascular: No clubbing, cyanosis, or edema. Respiratory: Normal respiratory effort, no increased work of breathing. GI: Abdomen is soft, nontender, nondistended, no abdominal masses GU: No CVA tenderness.  Lymph: No cervical or inguinal lymphadenopathy. Skin: No rashes, bruises or suspicious lesions. Neurologic: Grossly intact, no focal deficits, moving all 4 extremities. Psychiatric: Normal mood and affect.  Laboratory Data: Lab Results  Component Value Date   WBC 4.9 03/02/2023   HGB 14.3 03/02/2023   HCT 44.6 03/02/2023   MCV 87.5 03/02/2023   PLT 269 03/02/2023    Lab Results  Component Value Date   CREATININE 1.02 03/02/2023    No results found for: "PSA"  No results found for: "TESTOSTERONE"  Lab Results  Component Value Date   HGBA1C 6.3 (H) 04/26/2021    Urinalysis    Component Value Date/Time   COLORURINE YELLOW 09/11/2015 1012   APPEARANCEUR CLOUDY (A) 09/11/2015 1012   LABSPEC 1.020 09/11/2015 1012   PHURINE 6.0 09/11/2015 1012   GLUCOSEU NEGATIVE 09/11/2015 1012   HGBUR LARGE (A) 09/11/2015 1012   BILIRUBINUR NEGATIVE 09/11/2015 1012   KETONESUR NEGATIVE 09/11/2015 1012   PROTEINUR TRACE (A) 09/11/2015 1012   UROBILINOGEN 0.2 04/23/2015 1015   NITRITE POSITIVE (A) 09/11/2015 1012   LEUKOCYTESUR LARGE  (A) 09/11/2015 1012    Lab Results  Component Value Date   BACTERIA MANY (A) 09/11/2015    Pertinent Imaging:  No results found for this or any previous visit.  No results found for this or any previous visit.  No results found for this or any previous visit.  No results found for this or any previous visit.  No results found for this or any previous visit.  No valid procedures specified. No results found for this or any previous visit.  No results found for this or any previous visit.   Assessment & Plan:    1. Prostate cancer [facility]) I discussed the natural history of  intermediate risk prostate cancer with the patient and the various treatment options including active surveillance, RALP, IMRT, brachytherapy, cryotherapy, HIFU and ADT. After discussing the options the patient elects for Radiation therapy versus surveillance.  The patient will be referred to Dr. Kathrynn Running for consideration of radiation therapy. PSMA PET ordered   No follow-ups on file.  [name], MD  [facility] [place]

## 2023-06-26 ENCOUNTER — Encounter: Payer: Self-pay | Admitting: Urology

## 2023-06-26 NOTE — Patient Instructions (Signed)

## 2023-07-04 ENCOUNTER — Telehealth: Payer: Self-pay | Admitting: Radiation Oncology

## 2023-07-04 NOTE — Telephone Encounter (Signed)
Left message for patient to call back to schedule consult per 9/4 referral.

## 2023-07-05 ENCOUNTER — Telehealth: Payer: Self-pay | Admitting: Radiation Oncology

## 2023-07-05 NOTE — Progress Notes (Signed)
Patient declined appointment for consult per Dr. Dimas Millin referral request.   RN notified staff at Dr. Dimas Millin office that patient has declined and would like to discuss surgery options.

## 2023-07-05 NOTE — Telephone Encounter (Signed)
Patient called back regarding 9/4 referral. Patient stated he wanted to speak to Dr. Ronne Binning regarding prostate removal instead of Radiation treatments. Advised patient to reach out to referring office to be re-referred in the future.

## 2023-08-29 ENCOUNTER — Ambulatory Visit: Payer: Medicare HMO | Admitting: Urology

## 2023-11-08 ENCOUNTER — Encounter (HOSPITAL_COMMUNITY)
Admission: RE | Admit: 2023-11-08 | Discharge: 2023-11-08 | Disposition: A | Discharge status: Home / Self Care | Payer: Medicare HMO | Source: Ambulatory Visit | Attending: Urology | Admitting: Urology

## 2023-11-08 DIAGNOSIS — C61 Malignant neoplasm of prostate: Secondary | ICD-10-CM | POA: Diagnosis present

## 2023-11-08 MED ORDER — FLOTUFOLASTAT F 18 GALLIUM 296-5846 MBQ/ML IV SOLN
8.2270 | Freq: Once | INTRAVENOUS | Status: AC
  Administered 2023-11-08: 8.227 via INTRAVENOUS

## 2023-11-14 ENCOUNTER — Ambulatory Visit: Payer: Medicare HMO | Admitting: Urology

## 2023-11-14 ENCOUNTER — Encounter: Payer: Self-pay | Admitting: Urology

## 2023-11-14 VITALS — BP 158/90 | HR 70

## 2023-11-14 DIAGNOSIS — C61 Malignant neoplasm of prostate: Secondary | ICD-10-CM | POA: Diagnosis not present

## 2023-11-14 LAB — URINALYSIS, ROUTINE W REFLEX MICROSCOPIC
Bilirubin, UA: NEGATIVE
Glucose, UA: NEGATIVE
Ketones, UA: NEGATIVE
Leukocytes,UA: NEGATIVE
Nitrite, UA: NEGATIVE
Protein,UA: NEGATIVE
RBC, UA: NEGATIVE
Specific Gravity, UA: 1.025 (ref 1.005–1.030)
Urobilinogen, Ur: 0.2 mg/dL (ref 0.2–1.0)
pH, UA: 6 (ref 5.0–7.5)

## 2023-11-14 NOTE — Progress Notes (Signed)
11/14/2023 11:15 AM   Elisabeth Cara May 15, 1951 [id number]  Referring provider: [name], NP [address] [place],  [place] [address]  Followup prostate cancer   HPI: [name] is a 73yo here for followup for prostate cancer. He cancelled his appointment with radiation oncology. He has known high volume Gleason 3+4=7 prostate cancer. PSA 12.1 in 09/2023. PSMA PET completed 1/23, read pending. He denies any significant LUTS.    PMH: Past Medical History:  Diagnosis Date   Bradycardia    CAD (coronary artery disease)    Chronic anticoagulation    DJD (degenerative joint disease)    DVT (deep venous thrombosis) (HCC) 06/25/2014   ED (erectile dysfunction)    HTN (hypertension)    Polio    right leg/arm weakness   Pulmonary embolism (HCC) 06/25/2014   Somatic dysfunction    Stroke [facility])    affected vision and speech mildly   Tubular adenoma of colon 2002    Surgical History: Past Surgical History:  Procedure Laterality Date   COLONOSCOPY  2002   Dr. Rehman--> Single diverticulum at the splenic flexure, tiny polyp at transverse colon, tubular adenomatous polyp.   COLONOSCOPY  08/23/2012   Procedure: COLONOSCOPY;  Surgeon: West Bali, MD;  [facility];  Service: Endoscopy;  Laterality: N/A;  10:15   COLONOSCOPY WITH PROPOFOL N/A 10/01/2020   Procedure: COLONOSCOPY WITH PROPOFOL;  Surgeon: Dolores Frame, MD;  [facility];  Service: Gastroenterology;  Laterality: N/A;  7:30 am   LEFT HEART CATH AND CORONARY ANGIOGRAPHY N/A 04/26/2021   Procedure: LEFT HEART CATH AND CORONARY ANGIOGRAPHY;  Surgeon: Swazil[place], Peter M, MD;  Location: [facility] INVASIVE CV LAB;  Service: Cardiovascular;  Laterality: N/A;   POLYPECTOMY  10/01/2020   Procedure: POLYPECTOMY;  Surgeon: Dolores Frame, MD;  [facility];  Service: Gastroenterology;;    Home Medications:  Allergies as of 11/14/2023       Reactions   Codeine Itching         Medication List        Accurate as of November 14, 2023 11:15 AM. If you have any questions, ask your nurse or doctor.          acetaminophen 500 MG tablet Commonly known as: TYLENOL Take 500 mg by mouth every 6 (six) hours as needed for mild pain or moderate pain.   allopurinol 300 MG tablet Commonly known as: ZYLOPRIM Take 300 mg by mouth daily.   ascorbic acid 500 MG tablet Commonly known as: VITAMIN C Take 500 mg by mouth daily.   aspirin EC 81 MG tablet Take 1 tablet (81 mg total) by mouth daily. Swallow whole.   atorvastatin 80 MG tablet Commonly known as: LIPITOR Take 1 tablet (80 mg total) by mouth daily.   ergocalciferol 1.25 MG (50000 UT) capsule Commonly known as: VITAMIN D2 Take 50,000 Units by mouth 2 (two) times a week.   famotidine 20 MG tablet Commonly known as: PEPCID TAKE 1 TABLET (20 MG) DAILY IF NEEDED FOR BREAK THROUGH INDIGESTION   Fish Oil 1000 MG Caps Take 1,000 mg by mouth daily.   Garlic 100 MG Tabs Take 100 mg by mouth daily.   isosorbide mononitrate 30 MG 24 hr tablet Commonly known as: IMDUR Take 1 tablet (30 mg total) by mouth daily. PATIENT MUST SCHEDULE APPOINTMENT FOR FUTURE REFILLS FIRST ATTEMPT   meclizine 25 MG tablet Commonly known as: ANTIVERT Take 1 tablet (25  mg total) by mouth 3 (three) times daily as needed for dizziness.   nitroGLYCERIN 0.4 MG SL tablet Commonly known as: NITROSTAT Place 1 tablet (0.4 mg total) under the tongue every 5 (five) minutes x 3 doses as needed for chest pain.   ondansetron 4 MG disintegrating tablet Commonly known as: ZOFRAN-ODT 4mg  ODT q4 hours prn nausea/vomit   pantoprazole 40 MG tablet Commonly known as: PROTONIX Take 1 tablet (40 mg total) by mouth daily.   rivaroxaban 20 MG Tabs tablet Commonly known as: XARELTO Take 20 mg by mouth daily with supper.   telmisartan-hydrochlorothiazide 80-12.5 MG tablet Commonly known as: MICARDIS HCT Take 1 tablet by mouth daily.    traZODone 100 MG tablet Commonly known as: DESYREL 1/2 to 1 tablet at bedtime Orally Once a day for 30 day(s)        Allergies:  Allergies  Allergen Reactions   Codeine Itching    Family History: Family History  Problem Relation Age of Onset   Colon cancer Father        age 61   Heart attack Father        deceased age 74   Liver disease Neg Hx     Social History:  reports that he has quit smoking. His smokeless tobacco use includes chew. He reports that he does not drink alcohol and does not use drugs.  ROS: All other review of systems were reviewed and are negative except what is noted above in HPI  Physical Exam: BP (!) 158/90   Pulse 70   Constitutional:  Alert and oriented, No acute distress. HEENT: [place] AT, moist mucus membranes.  Trachea midline, no masses. Cardiovascular: No clubbing, cyanosis, or edema. Respiratory: Normal respiratory effort, no increased work of breathing. GI: Abdomen is soft, nontender, nondistended, no abdominal masses GU: No CVA tenderness.  Lymph: No cervical or inguinal lymphadenopathy. Skin: No rashes, bruises or suspicious lesions. Neurologic: Grossly intact, no focal deficits, moving all 4 extremities. Psychiatric: Normal mood and affect.  Laboratory Data: Lab Results  Component Value Date   WBC 4.9 03/02/2023   HGB 14.3 03/02/2023   HCT 44.6 03/02/2023   MCV 87.5 03/02/2023   PLT 269 03/02/2023    Lab Results  Component Value Date   CREATININE 1.02 03/02/2023    No results found for: "PSA"  No results found for: "TESTOSTERONE"  Lab Results  Component Value Date   HGBA1C 6.3 (H) 04/26/2021    Urinalysis    Component Value Date/Time   COLORURINE YELLOW 09/11/2015 1012   APPEARANCEUR CLOUDY (A) 09/11/2015 1012   LABSPEC 1.020 09/11/2015 1012   PHURINE 6.0 09/11/2015 1012   GLUCOSEU NEGATIVE 09/11/2015 1012   HGBUR LARGE (A) 09/11/2015 1012   BILIRUBINUR NEGATIVE 09/11/2015 1012   KETONESUR NEGATIVE 09/11/2015  1012   PROTEINUR TRACE (A) 09/11/2015 1012   UROBILINOGEN 0.2 04/23/2015 1015   NITRITE POSITIVE (A) 09/11/2015 1012   LEUKOCYTESUR LARGE (A) 09/11/2015 1012    Lab Results  Component Value Date   BACTERIA MANY (A) 09/11/2015    Pertinent Imaging: PSMA PET: Images reviewed and discussed with the patient  No results found for this or any previous visit.  No results found for this or any previous visit.  No results found for this or any previous visit.  No results found for this or any previous visit.  No results found for this or any previous visit.  No results found for this or any previous visit.  No results found  for this or any previous visit.  No results found for this or any previous visit.   Assessment & Plan:    1. Prostate cancer (HCC) (Primary) I discussed the natural history of intermediate risk prostate cancer with the patient and the various treatment options including active surveillance, RALP, IMRT, brachytherapy, cryotherapy, HIFU and ADT. I will refer him back to Dr. Kathrynn Running for consideration of IMRT versus brachytherapy.  No follow-ups on file.  [name], MD  [facility] [place]

## 2023-11-14 NOTE — Patient Instructions (Signed)

## 2023-11-16 ENCOUNTER — Telehealth: Payer: Self-pay | Admitting: Radiation Oncology

## 2023-11-16 NOTE — Telephone Encounter (Signed)
Called patients wife to schedule patient for a consultation w. Dr. Kathrynn Running. No answer, LVM for a return call.

## 2023-11-19 ENCOUNTER — Telehealth: Payer: Self-pay | Admitting: Radiation Oncology

## 2023-11-19 NOTE — Telephone Encounter (Signed)
Called patient to schedule a consultation w. Dr. Manning. No answer, LVM for a return call.  ?

## 2023-11-20 ENCOUNTER — Telehealth: Payer: Self-pay | Admitting: Radiation Oncology

## 2023-11-20 NOTE — Telephone Encounter (Signed)
Called patient to schedule a consultation w. Dr. Manning. No answer, LVM for a return call.  ?

## 2023-11-20 NOTE — Progress Notes (Signed)
 Letter sent.

## 2023-11-21 NOTE — Progress Notes (Signed)
GU Location of Tumor / Histology: Prostate Ca  If Prostate Cancer, Gleason Score is (3 + 4) and PSA is (12.1 on 09/17/2023)  PSA 11.0 on 03/27/2023 PSA 10.4 on 02/02/2023  George Matthews presented as referral from Dr. [name] at [facility] [place].  Biopsies     06/20/2023 Dr. [name] NM PET (PSMA) Skull to Mid Thigh CLINICAL DATA:  Intermediate risk prostate carcinoma. Biopsy 3 months prior.   IMPRESSION: 1. High intensity radiotracer activity throughout the prostate gland consistent with multifocal prostate adenocarcinoma. 2. No evidence metastatic adenopathy in the pelvis or periaortic retroperitoneum. 3. No visceral metastasis or skeletal metastasis   Past/Anticipated interventions by urology, if any: NA  Past/Anticipated interventions by medical oncology, if any: NA  Weight changes, if any:  No  IPSS:  1 SHIM:  17  Bowel/Bladder complaints, if any:  No  Nausea/Vomiting, if any: No  Pain issues, if any:  0/10  SAFETY ISSUES: Prior radiation?  No Pacemaker/ICD? No Possible current pregnancy? Male Is the patient on methotrexate? No  Current Complaints / other details:

## 2023-11-29 ENCOUNTER — Encounter: Payer: Self-pay | Admitting: Radiation Oncology

## 2023-11-29 ENCOUNTER — Ambulatory Visit
Admission: RE | Admit: 2023-11-29 | Discharge: 2023-11-29 | Disposition: A | Discharge status: Home / Self Care | Payer: Medicare HMO | Source: Ambulatory Visit | Attending: Radiation Oncology | Admitting: Radiation Oncology

## 2023-11-29 ENCOUNTER — Telehealth: Payer: Self-pay

## 2023-11-29 VITALS — BP 142/84 | HR 84 | Temp 97.7°F | Resp 18 | Ht 75.5 in | Wt 244.6 lb

## 2023-11-29 DIAGNOSIS — Z7901 Long term (current) use of anticoagulants: Secondary | ICD-10-CM | POA: Diagnosis not present

## 2023-11-29 DIAGNOSIS — Z86718 Personal history of other venous thrombosis and embolism: Secondary | ICD-10-CM | POA: Diagnosis not present

## 2023-11-29 DIAGNOSIS — Z860101 Personal history of adenomatous and serrated colon polyps: Secondary | ICD-10-CM | POA: Diagnosis not present

## 2023-11-29 DIAGNOSIS — Z8673 Personal history of transient ischemic attack (TIA), and cerebral infarction without residual deficits: Secondary | ICD-10-CM | POA: Diagnosis not present

## 2023-11-29 DIAGNOSIS — I2510 Atherosclerotic heart disease of native coronary artery without angina pectoris: Secondary | ICD-10-CM | POA: Diagnosis not present

## 2023-11-29 DIAGNOSIS — Z7982 Long term (current) use of aspirin: Secondary | ICD-10-CM | POA: Diagnosis not present

## 2023-11-29 DIAGNOSIS — Z79899 Other long term (current) drug therapy: Secondary | ICD-10-CM | POA: Insufficient documentation

## 2023-11-29 DIAGNOSIS — C61 Malignant neoplasm of prostate: Secondary | ICD-10-CM | POA: Insufficient documentation

## 2023-11-29 DIAGNOSIS — Z800 Family history of malignant neoplasm of digestive organs: Secondary | ICD-10-CM | POA: Diagnosis not present

## 2023-11-29 DIAGNOSIS — Z86711 Personal history of pulmonary embolism: Secondary | ICD-10-CM | POA: Insufficient documentation

## 2023-11-29 DIAGNOSIS — I10 Essential (primary) hypertension: Secondary | ICD-10-CM | POA: Diagnosis not present

## 2023-11-29 DIAGNOSIS — M1990 Unspecified osteoarthritis, unspecified site: Secondary | ICD-10-CM | POA: Diagnosis not present

## 2023-11-29 NOTE — Progress Notes (Signed)
Introduced myself to the patient as the prostate nurse navigator.  No barriers to care identified at this time.  He is here to discuss his radiation treatment options and will proceed with brachytherapy.  I gave him my business card and asked him to call me with questions or concerns.  Also provided written instructions on next steps and brachytherapy for review.

## 2023-11-29 NOTE — Telephone Encounter (Signed)
Dr. Broadus John office called to coordinate Brachy therapy for patient.  Agreed date is at [facility] on 01/10/2024 at 730 start time.  Can you please add to surgery workque.

## 2023-11-29 NOTE — Progress Notes (Signed)
Radiation Oncology         [phone] ________________________________  Initial Outpatient Consultation  Name: George Matthews MRN: [id number]  Date: 11/29/2023  DOB: August 31, 1951  WU:JWJXBJ, [place] Morelle, NP  McKenzie, Mardene Celeste, MD   REFERRING PHYSICIAN: [name], MD  DIAGNOSIS: 73 y.o. gentleman with Stage T1c adenocarcinoma of the prostate with Gleason score of 3+4, and PSA of 12.1.    ICD-10-CM   1. Malignant neoplasm of prostate (HCC)  C61       HISTORY OF PRESENT ILLNESS: George Matthews is a 73 y.o. male with a diagnosis of prostate cancer. He was noted to have an elevated PSA of 10.4 on routine labs in 01/2023 by his primary care provider, Gilman Schmidt, NP.  A repeat PSA was performed in 04/2023 and remained elevated at 11.0. Accordingly, he was referred for evaluation in urology by Dr. Ronne Binning on 04/25/2023,  digital rectal examination performed at that time showed no nodules or concerning findings.  The patient proceeded to transrectal ultrasound with 12 biopsies of the prostate on 06/06/2023.  The prostate volume measured 58 cc.  Out of 12 core biopsies, 8 were positive.  The maximum Gleason score was 3+4, and this was seen in 5 of 6 cores on the left as well as in the right base lateral.  Additionally, Gleason 3+3 was seen in the right mid lateral and left mid lateral.  He had a repeat PSA on 09/17/2023 that was further elevated at 12.1.  A PSMA PET scan was performed on 11/08/2023 for disease staging and was without any evidence of metastatic disease.  The patient reviewed the biopsy, lab and imaging results with his urologist and he has kindly been referred today for discussion of potential radiation treatment options.   PREVIOUS RADIATION THERAPY: No  PAST MEDICAL HISTORY:  Past Medical History:  Diagnosis Date   Bradycardia    CAD (coronary artery disease)    Chronic anticoagulation    DJD (degenerative joint disease)    DVT (deep venous thrombosis) (HCC)  06/25/2014   ED (erectile dysfunction)    HTN (hypertension)    Polio    right leg/arm weakness   Pulmonary embolism (HCC) 06/25/2014   Somatic dysfunction    Stroke (HCC)    affected vision and speech mildly   Tubular adenoma of colon 2002      PAST SURGICAL HISTORY: Past Surgical History:  Procedure Laterality Date   COLONOSCOPY  2002   Dr. Rehman--> Single diverticulum at the splenic flexure, tiny polyp at transverse colon, tubular adenomatous polyp.   COLONOSCOPY  08/23/2012   Procedure: COLONOSCOPY;  Surgeon: [place] Bali, MD;  Location: [facility];  Service: Endoscopy;  Laterality: N/A;  10:15   COLONOSCOPY WITH PROPOFOL N/A 10/01/2020   Procedure: COLONOSCOPY WITH PROPOFOL;  Surgeon: Dolores Frame, MD;  Location: [facility];  Service: Gastroenterology;  Laterality: N/A;  7:30 am   LEFT HEART CATH AND CORONARY ANGIOGRAPHY N/A 04/26/2021   Procedure: LEFT HEART CATH AND CORONARY ANGIOGRAPHY;  Surgeon: [place], Peter M, MD;  Location: [facility] INVASIVE CV LAB;  Service: Cardiovascular;  Laterality: N/A;   POLYPECTOMY  10/01/2020   Procedure: POLYPECTOMY;  Surgeon: Dolores Frame, MD;  Location: [facility];  Service: Gastroenterology;;    FAMILY HISTORY:  Family History  Problem Relation Age of Onset   Colon cancer Father        age 27   Heart attack Father  deceased age 70   Liver disease Neg Hx     SOCIAL HISTORY:  Social History   Socioeconomic History   Marital status: Married    Spouse name: Not on file   Number of children: 2   Years of education: Not on file   Highest education level: Not on file  Occupational History   Occupation: retired, Producer, television/film/video and devp  Tobacco Use   Smoking status: Former   Smokeless tobacco: Current    Types: Engineer, drilling   Vaping status: Never Used  Substance and Sexual Activity   Alcohol use: No   Drug use: No   Sexual activity: Not on file  Other Topics Concern   Not on file  Social  History Narrative   Not on file   Social Drivers of Health   Financial Resource Strain: Not on file  Food Insecurity: Not on file  Transportation Needs: Not on file  Physical Activity: Not on file  Stress: Not on file  Social Connections: Not on file  Intimate Partner Violence: Not on file    ALLERGIES: Codeine and Atorvastatin  MEDICATIONS:  Current Outpatient Medications  Medication Sig Dispense Refill   acetaminophen (TYLENOL) 500 MG tablet Take 500 mg by mouth every 6 (six) hours as needed for mild pain or moderate pain.     allopurinol (ZYLOPRIM) 300 MG tablet Take 300 mg by mouth daily.      ascorbic acid (VITAMIN C) 500 MG tablet Take 500 mg by mouth daily.     aspirin EC 81 MG EC tablet Take 1 tablet (81 mg total) by mouth daily. Swallow whole. 30 tablet 11   atorvastatin (LIPITOR) 80 MG tablet Take 1 tablet (80 mg total) by mouth daily. 100 tablet 2   ergocalciferol (VITAMIN D2) 50000 UNITS capsule Take 50,000 Units by mouth 2 (two) times a week. (Patient not taking: Reported on 11/14/2023)     famotidine (PEPCID) 20 MG tablet TAKE 1 TABLET (20 MG) DAILY IF NEEDED FOR BREAK THROUGH INDIGESTION 30 tablet 6   Garlic 100 MG TABS Take 100 mg by mouth daily.     isosorbide mononitrate (IMDUR) 30 MG 24 hr tablet Take 1 tablet (30 mg total) by mouth daily. PATIENT MUST SCHEDULE APPOINTMENT FOR FUTURE REFILLS FIRST ATTEMPT 30 tablet 0   meclizine (ANTIVERT) 25 MG tablet Take 1 tablet (25 mg total) by mouth 3 (three) times daily as needed for dizziness. 30 tablet 0   nitroGLYCERIN (NITROSTAT) 0.4 MG SL tablet Place 1 tablet (0.4 mg total) under the tongue every 5 (five) minutes x 3 doses as needed for chest pain. 25 tablet 2   Omega-3 Fatty Acids (FISH OIL) 1000 MG CAPS Take 1,000 mg by mouth daily.     ondansetron (ZOFRAN-ODT) 4 MG disintegrating tablet 4mg  ODT q4 hours prn nausea/vomit 10 tablet 0   pantoprazole (PROTONIX) 40 MG tablet Take 1 tablet (40 mg total) by mouth daily. 30  tablet 6   rivaroxaban (XARELTO) 20 MG TABS tablet Take 20 mg by mouth daily with supper.     telmisartan-hydrochlorothiazide (MICARDIS HCT) 80-12.5 MG tablet Take 1 tablet by mouth daily.     traZODone (DESYREL) 100 MG tablet 1/2 to 1 tablet at bedtime Orally Once a day for 30 day(s)     No current facility-administered medications for this visit.    REVIEW OF SYSTEMS:  On review of systems, the patient reports that he is doing well overall. He denies any chest pain, shortness  of breath, cough, fevers, chills, night sweats, unintended weight changes. He denies any bowel disturbances, and denies abdominal pain, nausea or vomiting. He denies any new musculoskeletal or joint aches or pains. His IPSS was 1, indicating minimal urinary symptoms. His SHIM was 17, indicating he has mild erectile dysfunction. A complete review of systems is obtained and is otherwise negative.    PHYSICAL EXAM:  Wt Readings from Last 3 Encounters:  03/02/23 236 lb (107 kg)  08/10/21 244 lb (110.7 kg)  05/10/21 250 lb (113.4 kg)   Temp Readings from Last 3 Encounters:  06/06/23 97.6 F (36.4 C) (Oral)  03/02/23 97.7 F (36.5 C) (Oral)  04/27/21 99.2 F (37.3 C) (Oral)   BP Readings from Last 3 Encounters:  11/14/23 (!) 158/90  06/20/23 136/66  06/06/23 (!) 155/57   Pulse Readings from Last 3 Encounters:  11/14/23 70  06/20/23 73  06/06/23 (!) 58    /10  In general this is a well appearing African-American male in no acute distress. He's alert and oriented x4 and appropriate throughout the examination. Cardiopulmonary assessment is negative for acute distress, and he exhibits normal effort.    KPS = 100  100 - Normal; no complaints; no evidence of disease. 90   - Able to carry on normal activity; minor signs or symptoms of disease. 80   - Normal activity with effort; some signs or symptoms of disease. 57   - Cares for self; unable to carry on normal activity or to do active work. 60   - Requires  occasional assistance, but is able to care for most of his personal needs. 50   - Requires considerable assistance and frequent medical care. 40   - Disabled; requires special care and assistance. 30   - Severely disabled; hospital admission is indicated although death not imminent. 20   - Very sick; hospital admission necessary; active supportive treatment necessary. 10   - Moribund; fatal processes progressing rapidly. 0     - Dead  Karnofsky DA, Abelmann WH, Craver LS and Burchenal [facility] [phone]) The use of the nitrogen mustards in the palliative treatment of carcinoma: with particular reference to bronchogenic carcinoma Cancer 1 634-56  LABORATORY DATA:  Lab Results  Component Value Date   WBC 4.9 03/02/2023   HGB 14.3 03/02/2023   HCT 44.6 03/02/2023   MCV 87.5 03/02/2023   PLT 269 03/02/2023   Lab Results  Component Value Date   NA 138 03/02/2023   K 4.2 03/02/2023   CL 102 03/02/2023   CO2 29 03/02/2023   Lab Results  Component Value Date   ALT 12 03/02/2023   AST 22 03/02/2023   ALKPHOS 57 03/02/2023   BILITOT 0.8 03/02/2023     RADIOGRAPHY: NM PET (PSMA) SKULL TO MID THIGH Result Date: 11/16/2023 CLINICAL DATA:  Intermediate risk prostate carcinoma. Biopsy 3 months prior. EXAM: NUCLEAR MEDICINE PET SKULL BASE TO THIGH TECHNIQUE: 8.3 mCi Flotufolastat (Posluma) was injected intravenously. Full-ring PET imaging was performed from the skull base to thigh after the radiotracer. CT data was obtained and used for attenuation correction and anatomic localization. COMPARISON:  None Available. FINDINGS: NECK No radiotracer activity in neck lymph nodes. Incidental CT finding: None. CHEST No radiotracer accumulation within mediastinal or hilar lymph nodes. No suspicious pulmonary nodules on the CT scan. Incidental CT finding: None. ABDOMEN/PELVIS Prostate: Intense radiotracer activity throughout the prostate gland with most intense activity towards the LEFT apex with SUV max equal 19.6.  Activity involves both  the LEFT and RIGHT lobe of the prostate gland. Lymph nodes: No abnormal radiotracer accumulation within pelvic or abdominal nodes. Periaortic LEFT retroperitoneal lymph node measuring 10 mm (image 145/4) does not have radiotracer activity. Liver: No evidence of liver metastasis. Incidental CT finding: None. SKELETON No focal activity to suggest skeletal metastasis. IMPRESSION: 1. High intensity radiotracer activity throughout the prostate gland consistent with multifocal prostate adenocarcinoma. 2. No evidence metastatic adenopathy in the pelvis or periaortic retroperitoneum. 3. No visceral metastasis or skeletal metastasis Electronically Signed   By: Genevive Bi M.D.   On: 11/16/2023 14:26      IMPRESSION/PLAN: 1. 73 y.o. gentleman with Stage T1c adenocarcinoma of the prostate with Gleason Score of 3+ 4, and PSA of 12.1. We discussed the patient's workup and outlined the nature of prostate cancer in this setting. The patient's T stage, Gleason's score, and PSA put him into the favorable intermediate risk group but based on the volume of disease on biopsy, he is technically in the unfavorable intermediate risk group. Accordingly, he is eligible for a variety of potential treatment options including prostatectomy, brachytherapy, or ST-ADT concurrent with 5.5 weeks of external radiation. We discussed the available radiation techniques, and focused on the details and logistics of delivery. We discussed and outlined the risks, benefits, short and long-term effects associated with radiotherapy and compared and contrasted these with prostatectomy. We discussed the role of SpaceOAR gel in reducing the rectal toxicity associated with radiotherapy. We also detailed the role of ADT in the treatment of unfavorable intermediate risk prostate cancer and outlined the associated side effects that could be expected with this therapy.  He appears to have a good understanding of his disease and our  treatment recommendations which are of curative intent.  He was encouraged to ask questions that were answered to his stated satisfaction.  At the conclusion of our conversation, the patient is interested in moving forward with brachytherapy and use of SpaceOAR gel to reduce rectal toxicity from radiotherapy.  We will share our discussion with Dr. Ronne Binning and move forward with scheduling his CT [facility] planning appointment in the near future.  The patient met briefly with Darryl Nestle in our office who will be working closely with him to coordinate OR scheduling and pre and post procedure appointments.  We will contact the pharmaceutical rep to ensure that SpaceOAR is available at the time of procedure.  We enjoyed meeting him today and look forward to continuing to participate in his care.  We personally spent 70 minutes in this encounter including chart review, reviewing radiological studies, meeting face-to-face with the patient, entering orders and completing documentation.    Marguarite Arbour, PA-C    Margaretmary Dys, MD  [facility]  Radiation Oncology Direct Dial: [phone]  Fax: [phone] [email or web address]  Skype  [email or web address]

## 2023-12-04 ENCOUNTER — Other Ambulatory Visit: Payer: Self-pay

## 2023-12-04 ENCOUNTER — Telehealth: Payer: Self-pay | Admitting: *Deleted

## 2023-12-04 ENCOUNTER — Other Ambulatory Visit: Payer: Self-pay | Admitting: Urology

## 2023-12-04 DIAGNOSIS — C61 Malignant neoplasm of prostate: Secondary | ICD-10-CM

## 2023-12-04 MED ORDER — FLEET ENEMA RE ENEM: 1.0000 | ENEMA | Freq: Once | RECTAL | 0 refills | Status: AC

## 2023-12-04 NOTE — Telephone Encounter (Signed)
   Pre-operative Risk Assessment    Patient Name: George Matthews  DOB: [birth year] MRN: [id number]   Date of last office visit: 08/10/21 DR. HOCHREIN Date of next office visit: NONE   Request for Surgical Clearance    Procedure:   BRACHYTHERAPY AND SPACEOAR PROCEDURE   Date of Surgery:  Clearance 01/10/24                                Surgeon:  DR. Ronne Binning Surgeon's Group or Practice Name:  [facility] [place] Phone number:  [phone] Fax number:  [phone]   Type of Clearance Requested:   - Medical  - Pharmacy:  Hold Rivaroxaban (Xarelto) x 3 DAYS PRIOR   Type of Anesthesia:  General    Additional requests/questions:    Elpidio Anis   12/04/2023, 5:08 PM

## 2023-12-05 ENCOUNTER — Telehealth: Payer: Self-pay | Admitting: *Deleted

## 2023-12-05 NOTE — Telephone Encounter (Signed)
Called patient to inform of pre-seed appts. and implant date, spoke with patient and made him aware of these dates.

## 2023-12-06 NOTE — Telephone Encounter (Signed)
   Name: George Matthews  DOB: 09/13/1951  MRN: [id number]  Primary Cardiologist: [name], MD  Chart reviewed as part of pre-operative protocol coverage. Because of George Matthews's past medical history and time since last visit, he will require a follow-up in-office visit in order to better assess preoperative cardiovascular risk.Has not been seen by Dr. Antoine Poche since 2022.  Pre-op covering staff: - Please schedule appointment and call patient to inform them. If patient already had an upcoming appointment within acceptable timeframe, please add "pre-op clearance" to the appointment notes so provider is aware. - Please contact requesting surgeon's office via preferred method (i.e, phone, fax) to inform them of need for appointment prior to surgery.  Patient is on Xarelto due to DVT and is managed by PCP.     Joni Reining, NP  12/06/2023, 12:02 PM

## 2023-12-06 NOTE — Telephone Encounter (Signed)
Pt not seen in cardiology since 2022.  On Xarelto for history of unprovoked DVT/PE.  Please send to PCP for determination of Xarelto hold.

## 2023-12-06 NOTE — Telephone Encounter (Signed)
Called patient to set up an in-office visit not answer left a vm

## 2023-12-07 NOTE — Telephone Encounter (Signed)
Called patient to set up an office visit for a pre-op clearance on 03/10 @8 :50.

## 2023-12-14 NOTE — Telephone Encounter (Signed)
 Called patient to change appt from 12/24/23 to 12/27/23 for his pre-op clearance. Patient is aware.

## 2023-12-19 ENCOUNTER — Telehealth: Payer: Self-pay | Admitting: *Deleted

## 2023-12-19 NOTE — Telephone Encounter (Signed)
 CALLED PATIENT TO REMIND OF PRE-SEED APPTS. FOR 12-20-23, SPOKE WITH PATIENT AND HE IS AWARE OF THESE APPTS.

## 2023-12-19 NOTE — Progress Notes (Signed)
 Radiation Oncology         [phone] ________________________________  Outpatient Follow up- Pre-seed visit  Name: George Matthews MRN: [id number]  Date: 12/20/2023  DOB: [birth year]  YN:WGNFAO, San Morelle, NP  Matthews, George Celeste, MD   REFERRING PHYSICIAN: [name], MD  DIAGNOSIS: 73 y.o. gentleman with Stage T1c adenocarcinoma of the prostate with Gleason score of 3+4, and PSA of 12.1.     ICD-10-CM   1. Malignant neoplasm of prostate (HCC)  C61       HISTORY OF PRESENT ILLNESS: George Matthews is a 73 y.o. male with a diagnosis of prostate cancer. He was noted to have an elevated PSA of 10.4 on routine labs in 01/2023 by his primary care provider, Gilman Schmidt, NP.  A repeat PSA was performed in 04/2023 and remained elevated at 11.0. Accordingly, he was referred for evaluation in urology by Dr. Ronne Binning on 04/25/2023,  digital rectal examination performed at that time showed no nodules or concerning findings.  The patient proceeded to transrectal ultrasound with 12 biopsies of the prostate on 06/06/2023.  The prostate volume measured 58 cc.  Out of 12 core biopsies, 8 were positive.  The maximum Gleason score was 3+4, and this was seen in 5 of 6 cores on the left as well as in the right base lateral.  Additionally, Gleason 3+3 was seen in the right mid lateral and left mid lateral.   He had a repeat PSA on 09/17/2023 that was further elevated at 12.1.  A PSMA PET scan was performed on 11/08/2023 for disease staging and was without any evidence of metastatic disease.  The patient reviewed the biopsy results with his urologist and was kindly referred to [place] for discussion of potential radiation treatment options. We initially met the patient on 11/29/23 and he was most interested in proceeding with brachytherapy and SpaceOAR gel placement for treatment of his disease. He is here today for his pre-procedure imaging for planning and to answer any additional questions he may have about this  treatment.   PREVIOUS RADIATION THERAPY: No  PAST MEDICAL HISTORY:  Past Medical History:  Diagnosis Date   Bradycardia    CAD (coronary artery disease)    Chronic anticoagulation    DJD (degenerative joint disease)    DVT (deep venous thrombosis) (HCC) 06/25/2014   ED (erectile dysfunction)    HTN (hypertension)    Polio    right leg/arm weakness   Pulmonary embolism (HCC) 06/25/2014   Somatic dysfunction    Stroke (HCC)    affected vision and speech mildly   Tubular adenoma of colon 2002      PAST SURGICAL HISTORY: Past Surgical History:  Procedure Laterality Date   COLONOSCOPY  2002   Dr. Rehman--> Single diverticulum at the splenic flexure, tiny polyp at transverse colon, tubular adenomatous polyp.   COLONOSCOPY  08/23/2012   Procedure: COLONOSCOPY;  Surgeon: West Bali, MD;  Location: [facility];  Service: Endoscopy;  Laterality: N/A;  10:15   COLONOSCOPY WITH PROPOFOL N/A 10/01/2020   Procedure: COLONOSCOPY WITH PROPOFOL;  Surgeon: Dolores Frame, MD;  Location: [facility];  Service: Gastroenterology;  Laterality: N/A;  7:30 am   LEFT HEART CATH AND CORONARY ANGIOGRAPHY N/A 04/26/2021   Procedure: LEFT HEART CATH AND CORONARY ANGIOGRAPHY;  Surgeon: Sw[place], Peter M, MD;  Location: [facility] INVASIVE CV LAB;  Service: Cardiovascular;  Laterality: N/A;   POLYPECTOMY  10/01/2020   Procedure: POLYPECTOMY;  Surgeon: Dolores Frame,  MD;  Location: [facility];  Service: Gastroenterology;;    FAMILY HISTORY:  Family History  Problem Relation Age of Onset   Colon cancer Father        age 69   Heart attack Father        deceased age 89   Liver disease Neg Hx     SOCIAL HISTORY:  Social History   Socioeconomic History   Marital status: Married    Spouse name: Not on file   Number of children: 2   Years of education: Not on file   Highest education level: Not on file  Occupational History   Occupation: retired, Producer, television/film/video and devp  Tobacco  Use   Smoking status: Former   Smokeless tobacco: Current    Types: Engineer, drilling   Vaping status: Never Used  Substance and Sexual Activity   Alcohol use: No   Drug use: No   Sexual activity: Not on file  Other Topics Concern   Not on file  Social History Narrative   Not on file   Social Drivers of Health   Financial Resource Strain: Not on file  Food Insecurity: No Food Insecurity (11/29/2023)   Hunger Vital Sign    Worried About Running Out of Food in the Last Year: Never true    Ran Out of Food in the Last Year: Never true  Transportation Needs: No Transportation Needs (11/29/2023)   PRAPARE - Administrator, Civil Service (Medical): No    Lack of Transportation (Non-Medical): No  Physical Activity: Not on file  Stress: Not on file  Social Connections: Not on file  Intimate Partner Violence: Not At Risk (11/29/2023)   Humiliation, Afraid, Rape, and Kick questionnaire    Fear of Current or Ex-Partner: No    Emotionally Abused: No    Physically Abused: No    Sexually Abused: No    ALLERGIES: Codeine and Atorvastatin  MEDICATIONS:  Current Outpatient Medications  Medication Sig Dispense Refill   acetaminophen (TYLENOL) 500 MG tablet Take 500 mg by mouth every 6 (six) hours as needed for mild pain or moderate pain.     allopurinol (ZYLOPRIM) 300 MG tablet Take 300 mg by mouth daily.      ascorbic acid (VITAMIN C) 500 MG tablet Take 500 mg by mouth daily.     aspirin EC 81 MG EC tablet Take 1 tablet (81 mg total) by mouth daily. Swallow whole. 30 tablet 11   atorvastatin (LIPITOR) 80 MG tablet Take 1 tablet (80 mg total) by mouth daily. 100 tablet 2   ergocalciferol (VITAMIN D2) 50000 UNITS capsule Take 50,000 Units by mouth 2 (two) times a week. (Patient not taking: Reported on 11/14/2023)     famotidine (PEPCID) 20 MG tablet TAKE 1 TABLET (20 MG) DAILY IF NEEDED FOR BREAK THROUGH INDIGESTION 30 tablet 6   Garlic 100 MG TABS Take 100 mg by mouth daily.      isosorbide mononitrate (IMDUR) 30 MG 24 hr tablet Take 1 tablet (30 mg total) by mouth daily. PATIENT MUST SCHEDULE APPOINTMENT FOR FUTURE REFILLS FIRST ATTEMPT 30 tablet 0   meclizine (ANTIVERT) 25 MG tablet Take 1 tablet (25 mg total) by mouth 3 (three) times daily as needed for dizziness. 30 tablet 0   nitroGLYCERIN (NITROSTAT) 0.4 MG SL tablet Place 1 tablet (0.4 mg total) under the tongue every 5 (five) minutes x 3 doses as needed for chest pain. 25 tablet 2   Omega-3 Fatty  Acids (FISH OIL) 1000 MG CAPS Take 1,000 mg by mouth daily.     pantoprazole (PROTONIX) 40 MG tablet Take 1 tablet (40 mg total) by mouth daily. 30 tablet 6   rivaroxaban (XARELTO) 20 MG TABS tablet Take 20 mg by mouth daily with supper.     telmisartan-hydrochlorothiazide (MICARDIS HCT) 80-12.5 MG tablet Take 1 tablet by mouth daily.     No current facility-administered medications for this visit.    REVIEW OF SYSTEMS:  On review of systems, the patient reports that he is doing well overall. He denies any chest pain, shortness of breath, cough, fevers, chills, night sweats, unintended weight changes. He denies any bowel disturbances, and denies abdominal pain, nausea or vomiting. He denies any new musculoskeletal or joint aches or pains. His IPSS was 1, indicating minimal urinary symptoms. His SHIM was 17, indicating he has mild erectile dysfunction. A complete review of systems is obtained and is otherwise negative.     PHYSICAL EXAM:  Wt Readings from Last 3 Encounters:  11/29/23 244 lb 9.6 oz (110.9 kg)  03/02/23 236 lb (107 kg)  08/10/21 244 lb (110.7 kg)   Temp Readings from Last 3 Encounters:  11/29/23 97.7 F (36.5 C) (Tympanic)  06/06/23 97.6 F (36.4 C) (Oral)  03/02/23 97.7 F (36.5 C) (Oral)   BP Readings from Last 3 Encounters:  11/29/23 (!) 142/84  11/14/23 (!) 158/90  06/20/23 136/66   Pulse Readings from Last 3 Encounters:  11/29/23 84  11/14/23 70  06/20/23 73    /10  In general this  is a well appearing African American male in no acute distress. He's alert and oriented x4 and appropriate throughout the examination. Cardiopulmonary assessment is negative for acute distress, and he exhibits normal effort.     KPS = 100  100 - Normal; no complaints; no evidence of disease. 90   - Able to carry on normal activity; minor signs or symptoms of disease. 80   - Normal activity with effort; some signs or symptoms of disease. 107   - Cares for self; unable to carry on normal activity or to do active work. 60   - Requires occasional assistance, but is able to care for most of his personal needs. 50   - Requires considerable assistance and frequent medical care. 40   - Disabled; requires special care and assistance. 30   - Severely disabled; hospital admission is indicated although death not imminent. 20   - Very sick; hospital admission necessary; active supportive treatment necessary. 10   - Moribund; fatal processes progressing rapidly. 0     - Dead  Karnofsky DA, Abelmann WH, Craver LS and Burchenal [facility] [phone]) The use of the nitrogen mustards in the palliative treatment of carcinoma: with particular reference to bronchogenic carcinoma Cancer 1 634-56  LABORATORY DATA:  Lab Results  Component Value Date   WBC 4.9 03/02/2023   HGB 14.3 03/02/2023   HCT 44.6 03/02/2023   MCV 87.5 03/02/2023   PLT 269 03/02/2023   Lab Results  Component Value Date   NA 138 03/02/2023   K 4.2 03/02/2023   CL 102 03/02/2023   CO2 29 03/02/2023   Lab Results  Component Value Date   ALT 12 03/02/2023   AST 22 03/02/2023   ALKPHOS 57 03/02/2023   BILITOT 0.8 03/02/2023     RADIOGRAPHY: No results found.    IMPRESSION/PLAN: 1. 73 y.o. gentleman with Stage T1c adenocarcinoma of the prostate with Gleason score of  3+4, and PSA of 12.1.  The patient has elected to proceed with seed implant for treatment of his disease. We reviewed the risks, benefits, short and long-term effects associated  with brachytherapy and discussed the role of SpaceOAR in reducing the rectal toxicity associated with radiotherapy.  He appears to have a good understanding of his disease and our treatment recommendations which are of curative intent.  He was encouraged to ask questions that were answered to his stated satisfaction. He has freely signed written consent to proceed today in the office and a copy of this document will be placed in his medical record. His procedure is tentatively scheduled for 01/10/24 in collaboration with Dr. Ronne Binning and we will see him back for his post-procedure visit approximately 3 weeks thereafter. We look forward to continuing to participate in his care. He knows that he is welcome to call with any questions or concerns at any time in the interim.  I personally spent 30 minutes in this encounter including chart review, reviewing radiological studies, meeting face-to-face with the patient, entering orders and completing documentation.    Marguarite Arbour, MMS, PA-C [place]  [facility] Radiation Oncology Physician Assistant Direct Dial: [phone]  Fax: [phone]

## 2023-12-19 NOTE — Progress Notes (Signed)
  Radiation Oncology         [phone] ________________________________  Name: George Matthews MRN: [id number]  Date: 12/20/2023  DOB: Dec 30, 1950  SIMULATION AND TREATMENT PLANNING NOTE PUBIC ARCH STUDY  NW:GNFAOZ, San Morelle, NP  Ronne Binning Mardene Celeste, MD  DIAGNOSIS: 73 y.o. gentleman with Stage T1c adenocarcinoma of the prostate with Gleason score of 3+4, and PSA of 12.1.  Oncology History  Malignant neoplasm of prostate (HCC)  06/06/2023 Cancer Staging   Staging form: Prostate, AJCC 8th Edition - Clinical stage from 06/06/2023: Stage IIB (cT1c, cN0, cM0, PSA: 12.1, Grade Group: 2) - Signed by Marcello Fennel, PA-C on 11/29/2023 Histopathologic type: Adenocarcinoma, NOS Stage prefix: Initial diagnosis Prostate specific antigen (PSA) range: 10 to 19 Gleason primary pattern: 3 Gleason secondary pattern: 4 Gleason score: 7 Histologic grading system: 5 grade system Number of biopsy cores examined: 12 Number of biopsy cores positive: 8 Location of positive needle core biopsies: Both sides   11/29/2023 Initial Diagnosis   Malignant neoplasm of prostate (HCC)       ICD-10-CM   1. Malignant neoplasm of prostate (HCC)  C61       COMPLEX SIMULATION:  The patient presented today for evaluation for possible prostate seed implant. He was brought to the radiation planning suite and placed supine on the CT couch. A 3-dimensional image study set was obtained in upload to the planning computer. There, on each axial slice, I contoured the prostate gland. Then, using three-dimensional radiation planning tools I reconstructed the prostate in view of the structures from the transperineal needle pathway to assess for possible pubic arch interference. In doing so, I did not appreciate any pubic arch interference. Also, the patient's prostate volume was estimated based on the drawn structure. The volume was 59 cc.  Given the pubic arch appearance and prostate volume, patient remains a good candidate to  proceed with prostate seed implant. Today, he freely provided informed written consent to proceed.    PLAN: The patient will undergo prostate seed implant.   ________________________________  Artist Pais. Kathrynn Running, M.D.

## 2023-12-20 ENCOUNTER — Ambulatory Visit
Admission: RE | Admit: 2023-12-20 | Discharge: 2023-12-20 | Disposition: A | Discharge status: Home / Self Care | Payer: Self-pay | Source: Ambulatory Visit | Attending: Urology | Admitting: Urology

## 2023-12-20 ENCOUNTER — Encounter: Payer: Self-pay | Admitting: Urology

## 2023-12-20 ENCOUNTER — Ambulatory Visit
Admission: RE | Admit: 2023-12-20 | Discharge: 2023-12-20 | Disposition: A | Discharge status: Home / Self Care | Payer: Medicare HMO | Source: Ambulatory Visit | Attending: Radiation Oncology | Admitting: Radiation Oncology

## 2023-12-20 VITALS — Resp 18 | Ht 75.5 in | Wt 240.0 lb

## 2023-12-20 DIAGNOSIS — C61 Malignant neoplasm of prostate: Secondary | ICD-10-CM | POA: Diagnosis present

## 2023-12-20 NOTE — Progress Notes (Signed)
 Pre-seed nursing interview for a diagnosis of Malignant neoplasm of prostate (HCC) Stage IIB (cT1c, cN0, cM0, PSA: 12.1, Grade Group: 2)  Patient identity verified x2.   Patient reports doing well. No issues conveyed at this time.  Meaningful use complete.  Urinary Management medication(s)- None Urology appointment date- 01/16/24, with Dr. Ronne Binning at [facility] [place]   Resp 18   Ht 6' 3.5" (1.918 m)   Wt 240 lb (108.9 kg)   BMI 29.60 kg/m   This concludes the interaction.  Ruel Favors, LPN

## 2023-12-21 NOTE — Patient Instructions (Addendum)
 SURGICAL WAITING ROOM VISITATION  Patients having surgery or a procedure may have no more than 2 support people in the waiting area - these visitors may rotate.    Children under the age of 55 must have an adult with them who is not the patient.  Due to an increase in RSV and influenza rates and associated hospitalizations, children ages 16 and under may not visit patients in [facility] hospitals.  Visitors with respiratory illnesses are discouraged from visiting and should remain at home.  If the patient needs to stay at the hospital during part of their recovery, the visitor guidelines for inpatient rooms apply. Pre-op nurse will coordinate an appropriate time for 1 support person to accompany patient in pre-op.  This support person may not rotate.    Please refer to the [facility] website for the visitor guidelines for Inpatients (after your surgery is over and you are in a regular room).    Your procedure is scheduled on: 01/10/24   Report to [facility] Main Entrance    Report to admitting at 10:45 AM   Call this number if you have problems the morning of surgery [phone]   Do not eat food or drink liquids :After Midnight.          If you have questions, please contact your surgeon's office.   FOLLOW BOWEL PREP AND ANY ADDITIONAL PRE OP INSTRUCTIONS YOU RECEIVED FROM YOUR SURGEON'S OFFICE!!!     Oral Hygiene is also important to reduce your risk of infection.                                    Remember - BRUSH YOUR TEETH THE MORNING OF SURGERY WITH YOUR REGULAR TOOTHPASTE  DENTURES WILL BE REMOVED PRIOR TO SURGERY PLEASE DO NOT APPLY "Poly grip" OR ADHESIVES!!!   Stop all vitamins and herbal supplements 7 days before surgery.   Take these medicines the morning of surgery with A SIP OF WATER: Tylenol, Allopurinol, Atorvastatin, Pepcid, Isosorbide, Pantoprazole   These are anesthesia recommendations for holding your anticoagulants.  Please contact your  prescribing physician to confirm IF it is safe to hold your anticoagulants for this length of time.   Eliquis Apixaban   72 hours   Xarelto Rivaroxaban   72 hours  Plavix Clopidogrel   120 hours  Pletal Cilostazol   120 hours                                You may not have any metal on your body including jewelry, and body piercing             Do not wear make-up, lotions, powders, cologne, or deodorant              Men may shave face and neck.   Do not bring valuables to the hospital. [facility] IS NOT             RESPONSIBLE   FOR VALUABLES.   Contacts, glasses, dentures or bridgework may not be worn into surgery.  DO NOT BRING YOUR HOME MEDICATIONS TO THE HOSPITAL. PHARMACY WILL DISPENSE MEDICATIONS LISTED ON YOUR MEDICATION LIST TO YOU DURING YOUR ADMISSION IN THE HOSPITAL!    Patients discharged on the day of surgery will not be allowed to drive home.  Someone NEEDS to stay with you for  the first 24 hours after anesthesia.   Special Instructions: Bring a copy of your healthcare power of attorney and living will documents the day of surgery if you haven't scanned them before.              Please read over the following fact sheets you were given: IF YOU HAVE QUESTIONS ABOUT YOUR PRE-OP INSTRUCTIONS PLEASE CALL [phone] Contras    If you received a COVID test during your pre-op visit  it is requested that you wear a mask when out in public, stay away from anyone that may not be feeling well and notify your surgeon if you develop symptoms. If you test positive for Covid or have been in contact with anyone that has tested positive in the last 10 days please notify you surgeon.    [place] - Preparing for Surgery Before surgery, you can play an important role.  Because skin is not sterile, your skin needs to be as free of germs as possible.  You can reduce the number of germs on your skin by washing with CHG (chlorahexidine gluconate) soap before surgery.  CHG is an  antiseptic cleaner which kills germs and bonds with the skin to continue killing germs even after washing. Please DO NOT use if you have an allergy to CHG or antibacterial soaps.  If your skin becomes reddened/irritated stop using the CHG and inform your nurse when you arrive at Short Stay. Do not shave (including legs and underarms) for at least 48 hours prior to the first CHG shower.  You may shave your face/neck.  Please follow these instructions carefully:  1.  Shower with CHG Soap the night before surgery and the  morning of surgery.  2.  If you choose to wash your hair, wash your hair first as usual with your normal  shampoo.  3.  After you shampoo, rinse your hair and body thoroughly to remove the shampoo.                             4.  Use CHG as you would any other liquid soap.  You can apply chg directly to the skin and wash.  Gently with a scrungie or clean washcloth.  5.  Apply the CHG Soap to your body ONLY FROM THE NECK DOWN.   Do   not use on face/ open                           Wound or open sores. Avoid contact with eyes, ears mouth and   genitals (private parts).                       Wash face,  Genitals (private parts) with your normal soap.             6.  Wash thoroughly, paying special attention to the area where your    surgery  will be performed.  7.  Thoroughly rinse your body with warm water from the neck down.  8.  DO NOT shower/wash with your normal soap after using and rinsing off the CHG Soap.                9.  Pat yourself dry with a clean towel.            10.  Wear clean pajamas.  11.  Place clean sheets on your bed the night of your first shower and do not  sleep with pets. Day of Surgery : Do not apply any lotions/deodorants the morning of surgery.  Please wear clean clothes to the hospital/surgery center.  FAILURE TO FOLLOW THESE INSTRUCTIONS MAY RESULT IN THE CANCELLATION OF YOUR SURGERY  PATIENT  SIGNATURE_________________________________  NURSE SIGNATURE__________________________________  ________________________________________________________________________

## 2023-12-21 NOTE — Progress Notes (Addendum)
 COVID Vaccine Completed: yes  Date of COVID positive in last 90 days: no  PCP - Gilman Schmidt, NP Cardiologist - [name], MD  Medical clearance by Gilman Schmidt, NP in media tab 12/12/23  PET- 11/08/23 Epic Chest x-ray - n/a EKG - 03/05/23 Epic brady 45 Stress Test - many years ago per pt ECHO - 04/26/21 Epic Cardiac Cath - 04/26/21 Epic Pacemaker/ICD device last checked: n/a Spinal Cord Stimulator: n/a  Bowel Prep - no  Sleep Study - n/a CPAP -   Fasting Blood Sugar - n/a Checks Blood Sugar _____ times a day  Last dose of GLP1 agonist-  N/A GLP1 instructions:  Hold 7 days before surgery    Last dose of SGLT-2 inhibitors-  N/A SGLT-2 instructions:  Hold 3 days before surgery    Blood Thinner Instructions:  Xarelto, hold 3  days Aspirin Instructions: ASA 81, hold 7 days Last Dose: 01/06/24 0900  Activity level: Can go up a flight of stairs and perform activities of daily living without stopping and without symptoms of chest pain or shortness of breath.  Anesthesia review: PE, NSTEMI, CAD, HTN, DVT, stroke  Patient denies shortness of breath, fever, cough and chest pain at PAT appointment  Patient verbalized understanding of instructions that were given to them at the PAT appointment. Patient was also instructed that they will need to review over the PAT instructions again at home before surgery.

## 2023-12-24 ENCOUNTER — Encounter (HOSPITAL_COMMUNITY): Payer: Self-pay

## 2023-12-24 ENCOUNTER — Ambulatory Visit: Payer: Medicare HMO | Admitting: Physician Assistant

## 2023-12-24 ENCOUNTER — Encounter (HOSPITAL_COMMUNITY)
Admission: RE | Admit: 2023-12-24 | Discharge: 2023-12-24 | Disposition: A | Discharge status: Home / Self Care | Payer: Medicare HMO | Source: Ambulatory Visit | Attending: Urology | Admitting: Urology

## 2023-12-24 ENCOUNTER — Other Ambulatory Visit: Payer: Self-pay

## 2023-12-24 VITALS — BP 125/73 | HR 71 | Temp 98.3°F | Resp 16 | Ht 75.0 in | Wt 244.2 lb

## 2023-12-24 DIAGNOSIS — Z01812 Encounter for preprocedural laboratory examination: Secondary | ICD-10-CM | POA: Diagnosis present

## 2023-12-24 DIAGNOSIS — I10 Essential (primary) hypertension: Secondary | ICD-10-CM | POA: Insufficient documentation

## 2023-12-24 HISTORY — DX: Anxiety disorder, unspecified: F41.9

## 2023-12-24 HISTORY — DX: Gastro-esophageal reflux disease without esophagitis: K21.9

## 2023-12-24 LAB — BASIC METABOLIC PANEL
Anion gap: 9 (ref 5–15)
BUN: 21 mg/dL (ref 8–23)
CO2: 23 mmol/L (ref 22–32)
Calcium: 8.8 mg/dL — ABNORMAL LOW (ref 8.9–10.3)
Chloride: 108 mmol/L (ref 98–111)
Creatinine, Ser: 1.05 mg/dL (ref 0.61–1.24)
GFR, Estimated: >60 mL/min (ref >60)
Glucose, Bld: 93 mg/dL (ref 70–99)
Potassium: 4.2 mmol/L (ref 3.5–5.1)
Sodium: 140 mmol/L (ref 135–145)

## 2023-12-24 LAB — CBC
HCT: 43 % (ref 39.0–52.0)
Hemoglobin: 13.4 g/dL (ref 13.0–17.0)
MCH: 29.1 pg (ref 26.0–34.0)
MCHC: 31.2 g/dL (ref 30.0–36.0)
MCV: 93.5 fL (ref 80.0–100.0)
Platelets: 320 10*3/uL (ref 150–400)
RBC: 4.6 MIL/uL (ref 4.22–5.81)
RDW: 15.3 % (ref 11.5–15.5)
WBC: 5.4 10*3/uL (ref 4.0–10.5)
nRBC: 0 % (ref 0.0–0.2)

## 2023-12-25 NOTE — Progress Notes (Signed)
 Letter sent.

## 2023-12-27 ENCOUNTER — Encounter: Payer: Self-pay | Admitting: Nurse Practitioner

## 2023-12-27 ENCOUNTER — Ambulatory Visit: Payer: Medicare HMO | Attending: Nurse Practitioner | Admitting: Nurse Practitioner

## 2023-12-27 VITALS — BP 120/60 | HR 64 | Ht 75.5 in | Wt 243.0 lb

## 2023-12-27 DIAGNOSIS — Z86718 Personal history of other venous thrombosis and embolism: Secondary | ICD-10-CM

## 2023-12-27 DIAGNOSIS — E785 Hyperlipidemia, unspecified: Secondary | ICD-10-CM

## 2023-12-27 DIAGNOSIS — Z8673 Personal history of transient ischemic attack (TIA), and cerebral infarction without residual deficits: Secondary | ICD-10-CM

## 2023-12-27 DIAGNOSIS — I10 Essential (primary) hypertension: Secondary | ICD-10-CM

## 2023-12-27 DIAGNOSIS — I2510 Atherosclerotic heart disease of native coronary artery without angina pectoris: Secondary | ICD-10-CM

## 2023-12-27 DIAGNOSIS — Z01810 Encounter for preprocedural cardiovascular examination: Secondary | ICD-10-CM | POA: Diagnosis not present

## 2023-12-27 DIAGNOSIS — Z86711 Personal history of pulmonary embolism: Secondary | ICD-10-CM | POA: Diagnosis not present

## 2023-12-27 LAB — COMPREHENSIVE METABOLIC PANEL
ALT: 8 IU/L (ref 0–44)
AST: 19 IU/L (ref 0–40)
Albumin: 4.2 g/dL (ref 3.8–4.8)
Alkaline Phosphatase: 75 IU/L (ref 44–121)
BUN/Creatinine Ratio: 16 (ref 10–24)
BUN: 18 mg/dL (ref 8–27)
Bilirubin Total: 0.6 mg/dL (ref 0.0–1.2)
CO2: 24 mmol/L (ref 20–29)
Calcium: 9.4 mg/dL (ref 8.6–10.2)
Chloride: 103 mmol/L (ref 96–106)
Creatinine, Ser: 1.13 mg/dL (ref 0.76–1.27)
Globulin, Total: 3 g/dL (ref 1.5–4.5)
Glucose: 100 mg/dL — ABNORMAL HIGH (ref 70–99)
Potassium: 4.6 mmol/L (ref 3.5–5.2)
Sodium: 140 mmol/L (ref 134–144)
Total Protein: 7.2 g/dL (ref 6.0–8.5)
eGFR: 68 mL/min/{1.73_m2} (ref >59)

## 2023-12-27 LAB — LIPID PANEL
Chol/HDL Ratio: 4.7 ratio (ref 0.0–5.0)
Cholesterol, Total: 178 mg/dL (ref 100–199)
HDL: 38 mg/dL — ABNORMAL LOW (ref >39)
LDL Chol Calc (NIH): 119 mg/dL — ABNORMAL HIGH (ref 0–99)
Triglycerides: 115 mg/dL (ref 0–149)
VLDL Cholesterol Cal: 21 mg/dL (ref 5–40)

## 2023-12-27 NOTE — Progress Notes (Signed)
 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  Past Medical History    Past Medical History:  Diagnosis Date   Anxiety    Bradycardia    CAD (coronary artery disease)    Chronic anticoagulation    DJD (degenerative joint disease)    DVT (deep venous thrombosis) (HCC) 06/25/2014   ED (erectile dysfunction)    GERD (gastroesophageal reflux disease)    HTN (hypertension)    Polio    right leg/arm weakness   Pulmonary embolism (HCC) 06/25/2014   Somatic dysfunction    Stroke (HCC)    affected vision and speech mildly   Tubular adenoma of colon 2002   Past Surgical History:  Procedure Laterality Date   COLONOSCOPY  2002   Dr. Rehman--> Single diverticulum at the splenic flexure, tiny polyp at transverse colon, tubular adenomatous polyp.   COLONOSCOPY  08/23/2012   Procedure: COLONOSCOPY;  Surgeon: [place] Bali, MD;  [facility];  Service: Endoscopy;  Laterality: N/A;  10:15   COLONOSCOPY WITH PROPOFOL N/A 10/01/2020   Procedure: COLONOSCOPY WITH PROPOFOL;  Surgeon: Dolores Frame, MD;  [facility];  Service: Gastroenterology;  Laterality: N/A;  7:30 am   LEFT HEART CATH AND CORONARY ANGIOGRAPHY N/A 04/26/2021   Procedure: LEFT HEART CATH AND CORONARY ANGIOGRAPHY;  Surgeon: [place], Peter M, MD;  Location: [facility] INVASIVE CV LAB;  Service: Cardiovascular;  Laterality: N/A;   POLYPECTOMY  10/01/2020   Procedure: POLYPECTOMY;  Surgeon: Dolores Frame, MD;  [facility];  Service: Gastroenterology;;     Allergies  Allergies  Allergen Reactions   Codeine Itching   Atorvastatin Itching     Labs/Other Studies Reviewed    The following studies were reviewed today:  Cardiac Studies & Procedures   ______________________________________________________________________________________________ CARDIAC CATHETERIZATION  CARDIAC CATHETERIZATION 04/26/2021  Narrative  1st Diag lesion is 50% stenosed.  Prox LAD to Mid LAD lesion is 30% stenosed.  1st Mrg lesion is 30% stenosed.  Prox Cx to Mid Cx lesion is 20% stenosed.  Dist RCA lesion is 100% stenosed.  LV end diastolic pressure is normal.  1. Single vessel occlusive CAD involving the distal RCA. There are left to right collaterals 2. Normal LVEDP  Plan: recommend medical therapy. OK to resume Xarelto tomorrow.  Findings Coronary Findings Diagnostic  Dominance: Right  Left Main Vessel was injected. Vessel is normal in caliber. Vessel is angiographically normal.  Left Anterior Descending Prox LAD to Mid LAD lesion is 30% stenosed.  First Diagonal Branch 1st Diag lesion is 50% stenosed.  Left Circumflex Prox Cx to Mid Cx lesion is 20% stenosed.  First Obtuse Marginal Branch 1st Mrg lesion is 30% stenosed.  Right Coronary Artery Dist RCA lesion is 100% stenosed. The lesion is thrombotic.  Right Posterior Descending Artery Collaterals RPDA filled by collaterals from Dist LAD.  Intervention  No interventions have been documented.     ECHOCARDIOGRAM  ECHOCARDIOGRAM COMPLETE 04/26/2021  Narrative ECHOCARDIOGRAM REPORT    Patient Name:   George Matthews Date of Exam: 04/26/2021 Medical Rec #:  [id number]  Height:       75.5 in Accession #:    [id number]    Weight:       265.0 lb Date of Birth:  11-25-1950      BSA:          2.485 m Patient Age:    70 years      BP:           112/52 mmHg Patient Gender: M             HR:           64 bpm. [facility]  Procedure: 2D Echo, Cardiac  Doppler and Color Doppler  Indications:    NSTEMI  History:        Patient has prior history of Echocardiogram examinations, most recent 06/27/2014. Signs/Symptoms:Chest Pain; Risk Factors:Former Smoker.  Sonographer:    Mikki Harbor Referring Phys: [id number] CHRISTOPHER A WROBEL  IMPRESSIONS   1. Left ventricular ejection fraction, by estimation, is 55 to 60%. The left ventricle has normal function. The left ventricle has no regional wall motion abnormalities. There is mild left ventricular hypertrophy. Left ventricular diastolic parameters were normal. 2. Right ventricular systolic function is normal. The right ventricular size is normal. Tricuspid regurgitation signal is inadequate for assessing PA pressure. 3. The mitral valve is grossly normal. Trivial mitral valve regurgitation. 4. The aortic valve is tricuspid. Aortic valve regurgitation is not visualized. Aortic valve mean gradient measures 2.0 mmHg. 5. The inferior vena cava is normal in size with greater than 50% respiratory variability, suggesting right atrial pressure of 3 mmHg.  FINDINGS Left Ventricle: Left ventricular ejection fraction, by estimation, is 55 to 60%. The left ventricle has normal function. The left ventricle has no regional wall motion abnormalities. The left ventricular internal cavity size was normal in size. There is mild left ventricular hypertrophy. Left ventricular diastolic parameters were normal.   LV Wall Scoring: The basal inferior segment is hypokinetic. The entire anterior wall, entire lateral wall, entire septum, entire apex, and mid and distal inferior wall are normal.  Right Ventricle: The right ventricular size is normal. No increase in right ventricular wall thickness. Right ventricular systolic function is normal. Tricuspid regurgitation signal is inadequate for assessing PA pressure.  Left Atrium: Left atrial size was normal in size.  Right Atrium: Right atrial size was normal in  size.  Pericardium: There is no evidence of pericardial effusion.  Mitral Valve: The mitral valve is grossly normal. Mild mitral annular calcification. Trivial mitral valve regurgitation. MV peak gradient, 3.3 mmHg. The mean mitral valve gradient is 1.0 mmHg.  Tricuspid Valve: The tricuspid valve is grossly normal. Tricuspid valve regurgitation is trivial.  Aortic Valve: The aortic valve is tricuspid. There is mild aortic valve annular calcification. Aortic valve regurgitation is not visualized. Aortic valve mean gradient measures 2.0 mmHg. Aortic valve peak gradient measures 4.8 mmHg. Aortic valve area, by VTI measures 2.75 cm.  Pulmonic Valve: The pulmonic valve was grossly normal. Pulmonic valve regurgitation is trivial.  Aorta: The aortic root is normal in size and structure.  Venous: The inferior vena cava is normal in size with greater than 50% respiratory variability, suggesting right atrial pressure of 3 mmHg.  IAS/Shunts: No atrial level shunt detected by color flow Doppler.   LEFT VENTRICLE PLAX 2D LVIDd:         5.12 cm     Diastology LVIDs:         3.09 cm  LV e' medial:    6.92 cm/s LV PW:         1.19 cm     LV E/e' medial:  14.0 LV IVS:        0.97 cm     LV e' lateral:   5.52 cm/s LVOT diam:     2.00 cm     LV E/e' lateral: 17.5 LV SV:         74 LV SV Index:   30 LVOT Area:     3.14 cm  LV Volumes (MOD) LV vol d, MOD A2C: 80.7 ml LV vol d, MOD A4C: 92.0 ml LV vol s, MOD A2C: 46.0 ml LV vol s, MOD A4C: 51.0 ml LV SV MOD A2C:     34.7 ml LV SV MOD A4C:     92.0 ml LV SV MOD BP:      40.4 ml  RIGHT VENTRICLE RV Basal diam:  3.35 cm RV Mid diam:    3.12 cm RV S prime:     10.80 cm/s TAPSE (M-mode): 2.0 cm  LEFT ATRIUM             Index       RIGHT ATRIUM           Index LA diam:        3.70 cm 1.49 cm/m  RA Area:     15.00 cm LA Vol (A2C):   49.0 ml 19.72 ml/m RA Volume:   37.20 ml  14.97 ml/m LA Vol (A4C):   55.0 ml 22.13 ml/m LA Biplane Vol:  57.5 ml 23.14 ml/m AORTIC VALVE AV Area (Vmax):    3.06 cm AV Area (Vmean):   2.79 cm AV Area (VTI):     2.75 cm AV Vmax:           109.00 cm/s AV Vmean:          71.800 cm/s AV VTI:            0.268 m AV Peak Grad:      4.8 mmHg AV Mean Grad:      2.0 mmHg LVOT Vmax:         106.00 cm/s LVOT Vmean:        63.700 cm/s LVOT VTI:          0.235 m LVOT/AV VTI ratio: 0.88  AORTA Ao Root diam: 3.70 cm Ao Asc diam:  3.10 cm  MITRAL VALVE MV Area (PHT): 3.76 cm    SHUNTS MV Area VTI:   2.45 cm    Systemic VTI:  0.24 m MV Peak grad:  3.3 mmHg    Systemic Diam: 2.00 cm MV Mean grad:  1.0 mmHg MV Vmax:       0.92 m/s MV Vmean:      37.8 cm/s MV Decel Time: 202 msec MV E velocity: 96.80 cm/s MV A velocity: 53.10 cm/s MV E/A ratio:  1.82  Nona Dell MD Electronically signed by Nona Dell MD Signature Date/Time: 04/26/2021/1:20:45 PM    Final          ______________________________________________________________________________________________     Recent Labs: 03/02/2023: ALT 12 12/24/2023: BUN 21; Creatinine, Ser 1.05; Hemoglobin 13.4; Platelets 320; Potassium 4.2; Sodium 140  Recent Lipid Panel    Component Value Date/Time   CHOL 158 04/26/2021 0012   TRIG 33 04/26/2021 0012   HDL 38 (L) 04/26/2021 0012   CHOLHDL 4.2 04/26/2021 0012   VLDL 7 04/26/2021 0012   LDLCALC 113 (H)  04/26/2021 0012    History of Present Illness    73 year old male with the above past medical history including CAD, 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   He was hospitalized in July 2022 in the setting of NSTEMI. Cardiac catheterization at the time revealed single-vessel occlusive CAD with 100% stenosis of the distal left main with left-to-right collaterals, otherwise nonobstructive disease. Medical management was advised. Echocardiogram showed EF 55 to  60%, normal LV function, no RWMA, mild LVH, normal LV systolic function, no significant valvular abnormalities. He was last seen in the office on 08/10/2021 and was doing well from a cardiac standpoint.  He denied symptoms concerning for angina.  He presents today for follow-up and for preoperative cardiac evaluation for brachytherapy and SpaceOAR procedure on 01/10/2024 with Dr. Ronne Binning of [facility] urology [place] request to hold Xarelto prior to surgery. Since his last visit he has done well from a cardiac standpoint.  He denies any symptoms concerning for angina.  He remains active.  Overall, he reports feeling well.  Home Medications    Current Outpatient Medications  Medication Sig Dispense Refill   acetaminophen (TYLENOL) 500 MG tablet Take 500 mg by mouth every 6 (six) hours as needed for mild pain or moderate pain.     allopurinol (ZYLOPRIM) 300 MG tablet Take 300 mg by mouth daily.      ascorbic acid (VITAMIN C) 500 MG tablet Take 500 mg by mouth daily.     aspirin EC 81 MG EC tablet Take 1 tablet (81 mg total) by mouth daily. Swallow whole. 30 tablet 11   atorvastatin (LIPITOR) 80 MG tablet Take 1 tablet (80 mg total) by mouth daily. 100 tablet 2   ergocalciferol (VITAMIN D2) 50000 UNITS capsule Take 50,000 Units by mouth 2 (two) times a week.     famotidine (PEPCID) 20 MG tablet TAKE 1 TABLET (20 MG) DAILY IF NEEDED FOR BREAK THROUGH INDIGESTION 30 tablet 6   Garlic 100 MG TABS Take 100 mg by mouth daily.     isosorbide mononitrate (IMDUR) 30 MG 24 hr tablet Take 1 tablet (30 mg total) by mouth daily. PATIENT MUST SCHEDULE APPOINTMENT FOR FUTURE REFILLS FIRST ATTEMPT 30 tablet 0   meclizine (ANTIVERT) 25 MG tablet Take 1 tablet (25 mg total) by mouth 3 (three) times daily as needed for dizziness. 30 tablet 0   nitroGLYCERIN (NITROSTAT) 0.4 MG SL tablet Place 1 tablet (0.4 mg total) under the tongue every 5 (five) minutes x 3 doses as needed for chest pain. 25 tablet 2   Omega-3 Fatty  Acids (FISH OIL) 1000 MG CAPS Take 1,000 mg by mouth daily.     pantoprazole (PROTONIX) 40 MG tablet Take 1 tablet (40 mg total) by mouth daily. 30 tablet 6   rivaroxaban (XARELTO) 20 MG TABS tablet Take 20 mg by mouth daily with supper.     telmisartan-hydrochlorothiazide (MICARDIS HCT) 80-12.5 MG tablet Take 1 tablet by mouth daily.     No current facility-administered medications for this visit.     Review of Systems    He denies chest pain, palpitations, dyspnea, pnd, orthopnea, n, v, dizziness, syncope, edema, weight gain, or early satiety. All other systems reviewed and are otherwise negative except as noted above.   Physical Exam    VS:  BP 120/60   Pulse 64   Ht 6' 3.5" (1.918 m)   Wt 243 lb (110.2 kg)  SpO2 96%   BMI 29.97 kg/m  GEN: Well nourished, well developed, in no acute distress. HEENT: normal. Neck: Supple, no JVD, carotid bruits, or masses. Cardiac: RRR, no murmurs, rubs, or gallops. No clubbing, cyanosis, edema.  Radials/DP/PT 2+ and equal bilaterally.  Respiratory:  Respirations regular and unlabored, clear to auscultation bilaterally. GI: Soft, nontender, nondistended, BS + x 4. MS: no deformity or atrophy. Skin: warm and dry, no rash. Neuro:  Strength and sensation are intact. Psych: Normal affect.  Accessory Clinical Findings    ECG personally reviewed by me today - EKG Interpretation Date/Time:  Thursday December 27 2023 08:33:46 EDT Ventricular Rate:  64 PR Interval:  162 QRS Duration:  86 QT Interval:  404 QTC Calculation: 416 R Axis:   -12  Text Interpretation: Normal sinus rhythm Nonspecific ST and T wave abnormality When compared with ECG of 02-Mar-2023 08:03, No significant change was found Confirmed by Bernadene Person ([id number]) on 12/27/2023 8:42:15 AM  - no acute changes.   Lab Results  Component Value Date   WBC 5.4 12/24/2023   HGB 13.4 12/24/2023   HCT 43.0 12/24/2023   MCV 93.5 12/24/2023   PLT 320 12/24/2023   Lab Results  Component  Value Date   CREATININE 1.05 12/24/2023   BUN 21 12/24/2023   NA 140 12/24/2023   K 4.2 12/24/2023   CL 108 12/24/2023   CO2 23 12/24/2023   Lab Results  Component Value Date   ALT 12 03/02/2023   AST 22 03/02/2023   ALKPHOS 57 03/02/2023   BILITOT 0.8 03/02/2023   Lab Results  Component Value Date   CHOL 158 04/26/2021   HDL 38 (L) 04/26/2021   LDLCALC 113 (H) 04/26/2021   TRIG 33 04/26/2021   CHOLHDL 4.2 04/26/2021    Lab Results  Component Value Date   HGBA1C 6.3 (H) 04/26/2021    Assessment & Plan    1. CAD: Cardiac catheterization at the time revealed single-vessel occlusive CAD with 100% stenosis of the distal left main with left-to-right collaterals, otherwise nonobstructive disease. Medical management was advised. Echocardiogram showed EF 55 to 60%, normal LV function, no RWMA, mild LVH, normal LV systolic function, no significant valvular abnormalities. Stable with no anginal symptoms. No indication for ischemic evaluation.  Continue aspirin, telmisartan-HCTZ, Imdur, and Lipitor.  2. History of PE/DVT: S/p IVC filter, subsequently removed in 2015.  On chronic anticoagulation.  Continue Xarelto. Managed per PCP.  3. 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  Continue aspirin, Xarelto, Lipitor.  4. Hypertension: BP well controlled. Continue current antihypertensive regimen.   5. Hyperlipidemia: No recent LDL on file.  Will update fasting lipid panel, CMET today.  Continue Lipitor.  6. Preoperative cardiac exam:  According to the Revised Cardiac Risk Index (RCRI), his Perioperative Risk of Major Cardiac Event is (%): 6.6. His Functional Capacity in METs is: 7.01 according to the Duke Activity Status Index (DASI). Therefore, based on ACC/AHA guidelines, patient would be at acceptable risk for the planned procedure without further cardiovascular testing. I will route this recommendation to the requesting party via Epic fax function. Patient takes Xarelto  for history of unprovoked DVT/PE. This is not managed by cardiology.  Recommendations for holding Xarelto prior to surgery should come from managing provider (PCP).  From a cardiology perspective, he may hold aspirin for 7 days prior to procedure.  Please resume aspirin as soon as possible postprocedure at the discretion of the surgeon.  He does have a  history of ophthalmic stroke, additional recommendations for holding aspirin prior to procedure should come from managing provider (PCP).  7. Disposition: Follow-up in 1 year, sooner if needed.      Joylene Grapes, NP 12/27/2023, 9:04 AM

## 2023-12-27 NOTE — Patient Instructions (Addendum)
 Medication Instructions:  Your physician recommends that you continue on your current medications as directed. Please refer to the Current Medication list given to you today.  *If you need a refill on your cardiac medications before your next appointment, please call your pharmacy*   Lab Work: Fasting Lipid panel & CMET  Testing/Procedures: NONE ordered at this time of appointment   Follow-Up: At [facility], you and your health needs are our priority.  As part of our continuing mission to provide you with exceptional heart care, we have created designated Provider Care Teams.  These Care Teams include your primary Cardiologist (physician) and Advanced Practice Providers (APPs -  Physician Assistants and Nurse Practitioners) who all work together to provide you with the care you need, when you need it.  We recommend signing up for the patient portal called "MyChart".  Sign up information is provided on this After Visit Summary.  MyChart is used to connect with patients for Virtual Visits (Telemedicine).  Patients are able to view lab/test results, encounter notes, upcoming appointments, etc.  Non-urgent messages can be sent to your provider as well.   To learn more about what you can do with MyChart, go to [email or web address].    Your next appointment:   1 year(s)  Provider:   [name], MD  or Bernadene Person, NP

## 2023-12-28 ENCOUNTER — Telehealth: Payer: Self-pay

## 2023-12-28 DIAGNOSIS — I10 Essential (primary) hypertension: Secondary | ICD-10-CM

## 2023-12-28 DIAGNOSIS — E7800 Pure hypercholesterolemia, unspecified: Secondary | ICD-10-CM

## 2023-12-28 MED ORDER — EZETIMIBE 10 MG PO TABS: 10.0000 mg | ORAL_TABLET | Freq: Every day | ORAL | 3 refills | Status: AC

## 2023-12-28 NOTE — Telephone Encounter (Signed)
 Spoke with pt. Pt was notified of lab results and recommendations. Pt is going to start Zetia 10 mg as recommended and repeat lab work (fasting Lipid panel & LFTs) in 6-8 weeks. Medication sent to pts pharmacy and lab orders mailed to pt.

## 2024-01-01 NOTE — Anesthesia Preprocedure Evaluation (Addendum)
 Anesthesia Evaluation  Patient identified by MRN, date of birth, ID band Patient awake    Reviewed: Allergy & Precautions, H&P , NPO status , Patient's Chart, lab work & pertinent test results  Airway Mallampati: II   Neck ROM: full    Dental   Pulmonary former smoker   breath sounds clear to auscultation       Cardiovascular hypertension, + CAD and + DVT   Rhythm:regular Rate:Normal     Neuro/Psych   Anxiety     CVA    GI/Hepatic ,GERD  ,,  Endo/Other    Renal/GU      Musculoskeletal  (+) Arthritis ,    Abdominal   Peds  Hematology   Anesthesia Other Findings   Reproductive/Obstetrics                             Anesthesia Physical Anesthesia Plan  ASA: 3  Anesthesia Plan: General   Post-op Pain Management:    Induction: Intravenous  PONV Risk Score and Plan: 2 and Ondansetron, Dexamethasone and Treatment may vary due to age or medical condition  Airway Management Planned: Oral ETT  Additional Equipment:   Intra-op Plan:   Post-operative Plan: Extubation in OR  Informed Consent: I have reviewed the patients History and Physical, chart, labs and discussed the procedure including the risks, benefits and alternatives for the proposed anesthesia with the patient or authorized representative who has indicated his/her understanding and acceptance.     Dental advisory given  Plan Discussed with: CRNA, Anesthesiologist and Surgeon  Anesthesia Plan Comments: (See PAT note 01/01/2024)       Anesthesia Quick Evaluation

## 2024-01-01 NOTE — Progress Notes (Signed)
 Anesthesia Chart Review   Case: [id number] Date/Time: 01/10/24 1245   Procedures:      INSERTION, RADIATION SOURCE, PROSTATE     INJECTION, HYDROGEL SPACER   Anesthesia type: General   Pre-op diagnosis: Prostate Cancer   Location: [facility] ROOM 10 / [facility]   Surgeons: [name], MD       DISCUSSION:73 y.o. former smoker with h/o HTN, PE, DVT, Stroke, CAD, prostate cancer scheduled for above procedure 01/10/2024 with Dr. [name].   Pt seen by cardiology 12/27/2023. Per OV note, "Preoperative cardiac exam:  According to the Revised Cardiac Risk Index (RCRI), his Perioperative Risk of Major Cardiac Event is (%): 6.6. His Functional Capacity in METs is: 7.01 according to the Duke Activity Status Index (DASI). Therefore, based on ACC/AHA guidelines, patient would be at acceptable risk for the planned procedure without further cardiovascular testing. I will route this recommendation to the requesting party via Epic fax function. Patient takes Xarelto for history of unprovoked DVT/PE. This is not managed by cardiology.  Recommendations for holding Xarelto prior to surgery should come from managing provider (PCP).  From a cardiology perspective, he may hold aspirin for 7 days prior to procedure.  Please resume aspirin as soon as possible postprocedure at the discretion of the surgeon.  He does have a history of ophthalmic stroke, additional recommendations for holding aspirin prior to procedure should come from managing provider (PCP). "  PCP received from PCP which states pt is low risk and may hold Xarelto for 3 days.  VS: There were no vitals taken for this visit.  PROVIDERS: [name], NP is PCP   Primary Cardiologist:  [name], MD  LABS: Labs reviewed: Acceptable for surgery. (all labs ordered are listed, but only abnormal results are displayed)  Labs Reviewed - No data to display   IMAGES:   EKG:   CV: Echo 04/26/2021 1. Left ventricular ejection  fraction, by estimation, is 55 to 60%. The  left ventricle has normal function. The left ventricle has no regional  wall motion abnormalities. There is mild left ventricular hypertrophy.  Left ventricular diastolic parameters  were normal.   2. Right ventricular systolic function is normal. The right ventricular  size is normal. Tricuspid regurgitation signal is inadequate for assessing  PA pressure.   3. The mitral valve is grossly normal. Trivial mitral valve  regurgitation.   4. The aortic valve is tricuspid. Aortic valve regurgitation is not  visualized. Aortic valve mean gradient measures 2.0 mmHg.   5. The inferior vena cava is normal in size with greater than 50%  respiratory variability, suggesting right atrial pressure of 3 mmHg.  Past Medical History:  Diagnosis Date   Anxiety    Bradycardia    CAD (coronary artery disease)    Chronic anticoagulation    DJD (degenerative joint disease)    DVT (deep venous thrombosis) (HCC) 06/25/2014   ED (erectile dysfunction)    GERD (gastroesophageal reflux disease)    HTN (hypertension)    Polio    right leg/arm weakness   Pulmonary embolism (HCC) 06/25/2014   Somatic dysfunction    Stroke (HCC)    affected vision and speech mildly   Tubular adenoma of colon 2002    Past Surgical History:  Procedure Laterality Date   COLONOSCOPY  2002   Dr. Rehman--> Single diverticulum at the splenic flexure, tiny polyp at transverse colon, tubular adenomatous polyp.   COLONOSCOPY  08/23/2012   Procedure: COLONOSCOPY;  Surgeon: Duncan Dull  Loreen Freud, MD;  [facility];  Service: Endoscopy;  Laterality: N/A;  10:15   COLONOSCOPY WITH PROPOFOL N/A 10/01/2020   Procedure: COLONOSCOPY WITH PROPOFOL;  Surgeon: Dolores Frame, MD;  [facility];  Service: Gastroenterology;  Laterality: N/A;  7:30 am   LEFT HEART CATH AND CORONARY ANGIOGRAPHY N/A 04/26/2021   Procedure: LEFT HEART CATH AND CORONARY ANGIOGRAPHY;  Surgeon:  [place]il[place], Peter M, MD;  Location: [facility] INVASIVE CV LAB;  Service: Cardiovascular;  Laterality: N/A;   POLYPECTOMY  10/01/2020   Procedure: POLYPECTOMY;  Surgeon: Dolores Frame, MD;  [facility];  Service: Gastroenterology;;    MEDICATIONS: No current facility-administered medications for this encounter.    acetaminophen (TYLENOL) 500 MG tablet   allopurinol (ZYLOPRIM) 300 MG tablet   ascorbic acid (VITAMIN C) 500 MG tablet   aspirin EC 81 MG EC tablet   atorvastatin (LIPITOR) 80 MG tablet   ergocalciferol (VITAMIN D2) 50000 UNITS capsule   famotidine (PEPCID) 20 MG tablet   Garlic 100 MG TABS   meclizine (ANTIVERT) 25 MG tablet   nitroGLYCERIN (NITROSTAT) 0.4 MG SL tablet   Omega-3 Fatty Acids (FISH OIL) 1000 MG CAPS   pantoprazole (PROTONIX) 40 MG tablet   rivaroxaban (XARELTO) 20 MG TABS tablet   telmisartan-hydrochlorothiazide (MICARDIS HCT) 80-12.5 MG tablet   ezetimibe (ZETIA) 10 MG tablet   isosorbide mononitrate (IMDUR) 30 MG 24 hr tablet    [facility], PA-C [facility] Pre-Surgical Testing [phone]

## 2024-01-03 NOTE — Progress Notes (Signed)
 RN left message for patient for call back for any questions or barriers prior to upcoming brachytherapy on 3/27.

## 2024-01-09 ENCOUNTER — Telehealth: Payer: Self-pay | Admitting: *Deleted

## 2024-01-09 NOTE — Telephone Encounter (Signed)
 CALLED PATIENT TO REMIND OF PROCEDURE FOR 01-10-24, LVM FOR A RETURN CALL

## 2024-01-10 ENCOUNTER — Ambulatory Visit (HOSPITAL_BASED_OUTPATIENT_CLINIC_OR_DEPARTMENT_OTHER): Payer: Self-pay | Admitting: Physician Assistant

## 2024-01-10 ENCOUNTER — Encounter (HOSPITAL_COMMUNITY)
Admission: RE | Disposition: A | Discharge status: Home / Self Care | Payer: Self-pay | Source: Ambulatory Visit | Attending: Urology

## 2024-01-10 ENCOUNTER — Encounter (HOSPITAL_COMMUNITY): Payer: Self-pay | Admitting: Urology

## 2024-01-10 ENCOUNTER — Ambulatory Visit (HOSPITAL_COMMUNITY)
Admission: RE | Admit: 2024-01-10 | Discharge: 2024-01-10 | Disposition: A | Discharge status: Home / Self Care | Payer: Medicare HMO | Source: Ambulatory Visit | Attending: Urology | Admitting: Urology

## 2024-01-10 ENCOUNTER — Other Ambulatory Visit: Payer: Self-pay

## 2024-01-10 ENCOUNTER — Ambulatory Visit (HOSPITAL_COMMUNITY): Payer: Self-pay | Admitting: Physician Assistant

## 2024-01-10 ENCOUNTER — Ambulatory Visit (HOSPITAL_COMMUNITY)

## 2024-01-10 DIAGNOSIS — I10 Essential (primary) hypertension: Secondary | ICD-10-CM | POA: Insufficient documentation

## 2024-01-10 DIAGNOSIS — Z87891 Personal history of nicotine dependence: Secondary | ICD-10-CM

## 2024-01-10 DIAGNOSIS — Z8673 Personal history of transient ischemic attack (TIA), and cerebral infarction without residual deficits: Secondary | ICD-10-CM | POA: Insufficient documentation

## 2024-01-10 DIAGNOSIS — I2510 Atherosclerotic heart disease of native coronary artery without angina pectoris: Secondary | ICD-10-CM

## 2024-01-10 DIAGNOSIS — K219 Gastro-esophageal reflux disease without esophagitis: Secondary | ICD-10-CM | POA: Insufficient documentation

## 2024-01-10 DIAGNOSIS — F419 Anxiety disorder, unspecified: Secondary | ICD-10-CM | POA: Insufficient documentation

## 2024-01-10 DIAGNOSIS — Z86718 Personal history of other venous thrombosis and embolism: Secondary | ICD-10-CM | POA: Diagnosis not present

## 2024-01-10 DIAGNOSIS — C61 Malignant neoplasm of prostate: Secondary | ICD-10-CM

## 2024-01-10 DIAGNOSIS — M1990 Unspecified osteoarthritis, unspecified site: Secondary | ICD-10-CM | POA: Insufficient documentation

## 2024-01-10 DIAGNOSIS — Z86711 Personal history of pulmonary embolism: Secondary | ICD-10-CM | POA: Insufficient documentation

## 2024-01-10 HISTORY — PX: RADIOACTIVE SEED IMPLANT: SHX5150

## 2024-01-10 HISTORY — PX: SPACE OAR INSTILLATION: SHX6769

## 2024-01-10 SURGERY — INSERTION, RADIATION SOURCE, PROSTATE
Anesthesia: General

## 2024-01-10 MED ORDER — SODIUM CHLORIDE FLUSH 0.9 % IV SOLN
INTRAVENOUS | Status: DC | PRN
  Administered 2024-01-10: 3 mL

## 2024-01-10 MED ORDER — LIDOCAINE HCL (PF) 2 % IJ SOLN
INTRAMUSCULAR | Status: DC | PRN
  Administered 2024-01-10: 60 mg via INTRADERMAL

## 2024-01-10 MED ORDER — ROCURONIUM BROMIDE 10 MG/ML (PF) SYRINGE
PREFILLED_SYRINGE | INTRAVENOUS | Status: DC | PRN
  Administered 2024-01-10: 30 mg via INTRAVENOUS
  Administered 2024-01-10: 50 mg via INTRAVENOUS

## 2024-01-10 MED ORDER — SODIUM CHLORIDE (PF) 0.9 % IJ SOLN
INTRAMUSCULAR | Status: AC
  Filled 2024-01-10: qty 10

## 2024-01-10 MED ORDER — OXYCODONE HCL 5 MG/5ML PO SOLN: 5.0000 mg | Freq: Once | ORAL | Status: DC | PRN

## 2024-01-10 MED ORDER — CHLORHEXIDINE GLUCONATE 0.12 % MT SOLN
15.0000 mL | Freq: Once | OROMUCOSAL | Status: AC
  Administered 2024-01-10: 15 mL via OROMUCOSAL

## 2024-01-10 MED ORDER — SUGAMMADEX SODIUM 200 MG/2ML IV SOLN
INTRAVENOUS | Status: DC | PRN
  Administered 2024-01-10: 200 mg via INTRAVENOUS

## 2024-01-10 MED ORDER — IOHEXOL 300 MG/ML  SOLN
INTRAMUSCULAR | Status: DC | PRN
  Administered 2024-01-10: 7 mL

## 2024-01-10 MED ORDER — FENTANYL CITRATE PF 50 MCG/ML IJ SOSY: 25.0000 ug | PREFILLED_SYRINGE | INTRAMUSCULAR | Status: DC | PRN

## 2024-01-10 MED ORDER — CEFAZOLIN SODIUM-DEXTROSE 2-4 GM/100ML-% IV SOLN
2.0000 g | INTRAVENOUS | Status: AC
  Administered 2024-01-10: 2 g via INTRAVENOUS
  Filled 2024-01-10: qty 100

## 2024-01-10 MED ORDER — LACTATED RINGERS IV SOLN: INTRAVENOUS | Status: DC

## 2024-01-10 MED ORDER — DEXAMETHASONE SODIUM PHOSPHATE 10 MG/ML IJ SOLN
INTRAMUSCULAR | Status: AC
  Filled 2024-01-10: qty 1

## 2024-01-10 MED ORDER — FENTANYL CITRATE (PF) 100 MCG/2ML IJ SOLN
INTRAMUSCULAR | Status: AC
  Filled 2024-01-10: qty 2

## 2024-01-10 MED ORDER — STERILE WATER FOR IRRIGATION IR SOLN
Status: DC | PRN
  Administered 2024-01-10: 3000 mL

## 2024-01-10 MED ORDER — FENTANYL CITRATE (PF) 100 MCG/2ML IJ SOLN
INTRAMUSCULAR | Status: DC | PRN
  Administered 2024-01-10 (×4): 50 ug via INTRAVENOUS

## 2024-01-10 MED ORDER — OXYCODONE HCL 5 MG PO TABS: 5.0000 mg | ORAL_TABLET | Freq: Once | ORAL | Status: DC | PRN

## 2024-01-10 MED ORDER — ONDANSETRON HCL 4 MG/2ML IJ SOLN: 4.0000 mg | Freq: Four times a day (QID) | INTRAMUSCULAR | Status: DC | PRN

## 2024-01-10 MED ORDER — ROCURONIUM BROMIDE 10 MG/ML (PF) SYRINGE
PREFILLED_SYRINGE | INTRAVENOUS | Status: AC
  Filled 2024-01-10: qty 10

## 2024-01-10 MED ORDER — LIDOCAINE HCL (PF) 2 % IJ SOLN
INTRAMUSCULAR | Status: AC
  Filled 2024-01-10: qty 5

## 2024-01-10 MED ORDER — ONDANSETRON HCL 4 MG/2ML IJ SOLN
INTRAMUSCULAR | Status: AC
  Filled 2024-01-10: qty 2

## 2024-01-10 MED ORDER — ORAL CARE MOUTH RINSE: 15.0000 mL | Freq: Once | OROMUCOSAL | Status: AC

## 2024-01-10 MED ORDER — TRAMADOL HCL 50 MG PO TABS: 50.0000 mg | ORAL_TABLET | Freq: Four times a day (QID) | ORAL | 0 refills | Status: DC | PRN

## 2024-01-10 MED ORDER — DEXAMETHASONE SODIUM PHOSPHATE 4 MG/ML IJ SOLN
INTRAMUSCULAR | Status: DC | PRN
  Administered 2024-01-10: 5 mg via INTRAVENOUS

## 2024-01-10 MED ORDER — ONDANSETRON HCL 4 MG/2ML IJ SOLN
INTRAMUSCULAR | Status: DC | PRN
  Administered 2024-01-10: 4 mg via INTRAVENOUS

## 2024-01-10 MED ORDER — PROPOFOL 10 MG/ML IV BOLUS
INTRAVENOUS | Status: DC | PRN
  Administered 2024-01-10: 150 mg via INTRAVENOUS
  Administered 2024-01-10: 50 mg via INTRAVENOUS

## 2024-01-10 MED ORDER — SUGAMMADEX SODIUM 200 MG/2ML IV SOLN
INTRAVENOUS | Status: AC
  Filled 2024-01-10: qty 2

## 2024-01-10 MED ORDER — PROPOFOL 10 MG/ML IV BOLUS
INTRAVENOUS | Status: AC
  Filled 2024-01-10: qty 20

## 2024-01-10 SURGICAL SUPPLY — 34 items
BAG COUNTER SPONGE SURGICOUNT (BAG) IMPLANT
BAG URINE DRAIN 2000ML AR STRL (UROLOGICAL SUPPLIES) ×1 IMPLANT
Bard QuickLink cartridges with BrachySource I-25 s IMPLANT
CATH FOLEY 2WAY SLVR 5CC 16FR (CATHETERS) ×2 IMPLANT
CATH ROBINSON RED A/P 20FR (CATHETERS) ×1 IMPLANT
COVER BACK TABLE 60X90IN (DRAPES) ×1 IMPLANT
COVER MAYO STAND STRL (DRAPES) ×1 IMPLANT
COVER SURGICAL LIGHT HANDLE (MISCELLANEOUS) ×1 IMPLANT
DRAPE U-SHAPE 47X51 STRL (DRAPES) ×1 IMPLANT
DRSG TEGADERM 4X4.75 (GAUZE/BANDAGES/DRESSINGS) ×2 IMPLANT
DRSG TEGADERM 8X12 (GAUZE/BANDAGES/DRESSINGS) ×2 IMPLANT
GLOVE BIO SURGEON STRL SZ7.5 (GLOVE) ×1 IMPLANT
GLOVE ECLIPSE 8.0 STRL XLNG CF (GLOVE) ×1 IMPLANT
GLOVE SURG LX STRL 7.5 STRW (GLOVE) ×2 IMPLANT
GOWN STRL REUS W/ TWL LRG LVL3 (GOWN DISPOSABLE) ×2 IMPLANT
GRID BRACH TEMP 18GA 2.8X3X.75 (MISCELLANEOUS) IMPLANT
HOLDER FOLEY CATH W/STRAP (MISCELLANEOUS) ×1 IMPLANT
IMPL SPACEOAR SYSTEM 10ML (Spacer) ×1 IMPLANT
IMPLANT SPACEOAR SYSTEM 10ML (Spacer) ×1 IMPLANT
KIT TURNOVER KIT A (KITS) IMPLANT
MARKER SKIN DUAL TIP RULER LAB (MISCELLANEOUS) ×1 IMPLANT
NDL BRACHY 18G 5PK (NEEDLE) ×1 IMPLANT
NDL BRACHY 18G SINGLE (NEEDLE) ×1 IMPLANT
NDL PK MORGANSTERN STABILIZ (NEEDLE) IMPLANT
NEEDLE BRACHY 18G 5PK (NEEDLE) ×4 IMPLANT
NEEDLE BRACHY 18G SINGLE (NEEDLE) IMPLANT
NEEDLE PK MORGANSTERN STABILIZ (NEEDLE) ×1 IMPLANT
PACK CYSTO (CUSTOM PROCEDURE TRAY) ×1 IMPLANT
SEED GOLD PRELOAD 1.2X3 (Urological Implant) IMPLANT
SURGILUBE 2OZ TUBE FLIPTOP (MISCELLANEOUS) ×1 IMPLANT
SYR 10ML LL (SYRINGE) ×1 IMPLANT
TOWEL OR 17X26 10 PK STRL BLUE (TOWEL DISPOSABLE) ×1 IMPLANT
TRAY FOLEY MTR SLVR 16FR STAT (SET/KITS/TRAYS/PACK) IMPLANT
UNDERPAD 30X36 HEAVY ABSORB (UNDERPADS AND DIAPERS) ×2 IMPLANT

## 2024-01-10 NOTE — Op Note (Signed)
 PRE-OPERATIVE DIAGNOSIS:  Adenocarcinoma of the prostate  POST-OPERATIVE DIAGNOSIS:  Same  PROCEDURE:  Procedure(s): 1. I-125 radioactive seed implantation 2. Cystoscopy 3. SpaceOAR placement  SURGEON:  Surgeon(s): [name], MD  Radiation oncologist: Margaretmary Dys, MD  ANESTHESIA:  General  EBL:  Minimal  DRAINS: 16 French Foley catheter  INDICATION: George Matthews is a 73 year old with a history of T1c prostate cancer. After discussing treatment options he has elected to proceed with brachytherapy  Description of procedure: After informed consent the patient was brought to the major OR, placed on the table and administered general anesthesia. He was then moved to the modified lithotomy position with his perineum perpendicular to the floor. His perineum and genitalia were then sterilely prepped. An official timeout was then performed. A 16 French Foley catheter was then placed in the bladder and filled with dilute contrast, a rectal tube was placed in the rectum and the transrectal ultrasound probe was placed in the rectum and affixed to the stand. He was then sterilely draped.  Real time ultrasonography was used along with the seed planning software [employer] vs. 4.2.21. This was used to develop the seed plan including the number of needles as well as number of seeds required for complete and adequate coverage. Real-time ultrasonography was then used along with the previously developed plan and the Nucletron device to implant a total of 69 seeds using 19 needles. This proceeded without difficulty or complication.  We then proceeded to mix the SpaceOAR using the kit supplied from the manufacturer. Once this was complete we placed a sinal needle into the perirectal fat between the rectum and the prostate. Once this was accomplished we injected 2cc of normal saline to hydrodissect the plain. We then instilled the the SpaceOAR through the spinal needle and noted good  distribution in the perirectal fat.    A Foley catheter was then removed as well as the transrectal ultrasound probe and rectal probe. Flexible cystoscopy was then performed using the 17 French flexible scope which revealed a normal urethra throughout its length down to the sphincter which appeared intact. The prostatic urethra revealed bilobar hypertrophy but no evidence of obstruction, seeds, spacers or lesions. The bladder was then entered and fully and systematically inspected. The ureteral orifices were noted to be of normal configuration and position. The mucosa revealed no evidence of tumors. There were also no stones identified within the bladder. I noted no seeds or spacers on the floor of the bladder and retroflexion of the scope revealed no seeds protruding from the base of the prostate.  The cystoscope was then removed and a new 16 French Foley catheter was then inserted and the balloon was filled with 10 cc of sterile water. This was connected to closed system drainage and the patient was awakened and taken to recovery room in stable and satisfactory condition. He tolerated procedure well and there were no intraoperative complications.

## 2024-01-10 NOTE — Anesthesia Procedure Notes (Signed)
 Procedure Name: Intubation Date/Time: 01/10/2024 1:50 PM  Performed by: Vanessa Ashley, CRNAPre-anesthesia Checklist: Patient identified, Emergency Drugs available, Suction available and Patient being monitored Patient Re-evaluated:Patient Re-evaluated prior to induction Oxygen Delivery Method: Circle system utilized Preoxygenation: Pre-oxygenation with 100% oxygen Induction Type: IV induction Ventilation: Mask ventilation without difficulty Laryngoscope Size: 2 and Miller Grade View: Grade I Tube type: Oral Tube size: 7.5 mm Number of attempts: 1 Airway Equipment and Method: Stylet Placement Confirmation: ETT inserted through vocal cords under direct vision, positive ETCO2 and breath sounds checked- equal and bilateral Secured at: 21 cm Tube secured with: Tape Dental Injury: Teeth and Oropharynx as per pre-operative assessment

## 2024-01-10 NOTE — H&P (Signed)
 HPI: [name] is a 73yo here for brachytherapy with SpaceOAR He has known high volume Gleason 3+4=7 prostate cancer. PSA 12.1 in 09/2023. PSMA PET completed 1/23, read pending. He denies any significant LUTS.      PMH:     Past Medical History:  Diagnosis Date   Bradycardia     CAD (coronary artery disease)     Chronic anticoagulation     DJD (degenerative joint disease)     DVT (deep venous thrombosis) (HCC) 06/25/2014   ED (erectile dysfunction)     HTN (hypertension)     Polio      right leg/arm weakness   Pulmonary embolism (HCC) 06/25/2014   Somatic dysfunction     Stroke [facility])      affected vision and speech mildly   Tubular adenoma of colon 2002          Surgical History:      Past Surgical History:  Procedure Laterality Date   COLONOSCOPY   2002    Dr. Rehman--> Single diverticulum at the splenic flexure, tiny polyp at transverse colon, tubular adenomatous polyp.   COLONOSCOPY   08/23/2012    Procedure: COLONOSCOPY;  Surgeon: West Bali, MD;  [facility];  Service: Endoscopy;  Laterality: N/A;  10:15   COLONOSCOPY WITH PROPOFOL N/A 10/01/2020    Procedure: COLONOSCOPY WITH PROPOFOL;  Surgeon: Dolores Frame, MD;  Location: [facility];  Service: Gastroenterology;  Laterality: N/A;  7:30 am   LEFT HEART CATH AND CORONARY ANGIOGRAPHY N/A 04/26/2021    Procedure: LEFT HEART CATH AND CORONARY ANGIOGRAPHY;  Surgeon: Swazil[place], Peter M, MD;  Location: [facility] INVASIVE CV LAB;  Service: Cardiovascular;  Laterality: N/A;   POLYPECTOMY   10/01/2020    Procedure: POLYPECTOMY;  Surgeon: Dolores Frame, MD;  [facility]: [facility];  Service: Gastroenterology;;          Home Medications:  Allergies as of 11/14/2023         Reactions    Codeine Itching            Medication List           Accurate as of November 14, 2023 11:15 AM. If you have any questions, ask your nurse or doctor.              acetaminophen 500 MG  tablet Commonly known as: TYLENOL Take 500 mg by mouth every 6 (six) hours as needed for mild pain or moderate pain.    allopurinol 300 MG tablet Commonly known as: ZYLOPRIM Take 300 mg by mouth daily.    ascorbic acid 500 MG tablet Commonly known as: VITAMIN C Take 500 mg by mouth daily.    aspirin EC 81 MG tablet Take 1 tablet (81 mg total) by mouth daily. Swallow whole.    atorvastatin 80 MG tablet Commonly known as: LIPITOR Take 1 tablet (80 mg total) by mouth daily.    ergocalciferol 1.25 MG (50000 UT) capsule Commonly known as: VITAMIN D2 Take 50,000 Units by mouth 2 (two) times a week.    famotidine 20 MG tablet Commonly known as: PEPCID TAKE 1 TABLET (20 MG) DAILY IF NEEDED FOR BREAK THROUGH INDIGESTION    Fish Oil 1000 MG Caps Take 1,000 mg by mouth daily.    Garlic 100 MG Tabs Take 100 mg by mouth daily.    isosorbide mononitrate 30 MG 24 hr tablet Commonly known as: IMDUR Take 1 tablet (30 mg total) by mouth daily.  PATIENT MUST SCHEDULE APPOINTMENT FOR FUTURE REFILLS FIRST ATTEMPT    meclizine 25 MG tablet Commonly known as: ANTIVERT Take 1 tablet (25 mg total) by mouth 3 (three) times daily as needed for dizziness.    nitroGLYCERIN 0.4 MG SL tablet Commonly known as: NITROSTAT Place 1 tablet (0.4 mg total) under the tongue every 5 (five) minutes x 3 doses as needed for chest pain.    ondansetron 4 MG disintegrating tablet Commonly known as: ZOFRAN-ODT 4mg  ODT q4 hours prn nausea/vomit    pantoprazole 40 MG tablet Commonly known as: PROTONIX Take 1 tablet (40 mg total) by mouth daily.    rivaroxaban 20 MG Tabs tablet Commonly known as: XARELTO Take 20 mg by mouth daily with supper.    telmisartan-hydrochlorothiazide 80-12.5 MG tablet Commonly known as: MICARDIS HCT Take 1 tablet by mouth daily.    traZODone 100 MG tablet Commonly known as: DESYREL 1/2 to 1 tablet at bedtime Orally Once a day for 30 day(s)             Allergies:   Allergies      Allergies  Allergen Reactions   Codeine Itching        Family History:      Family History  Problem Relation Age of Onset   Colon cancer Father          age 64   Heart attack Father          deceased age 5   Liver disease Neg Hx            Social History:  reports that he has quit smoking. His smokeless tobacco use includes chew. He reports that he does not drink alcohol and does not use drugs.   ROS: All other review of systems were reviewed and are negative except what is noted above in HPI   Physical Exam: BP (!) 158/90   Pulse 70   Constitutional:  Alert and oriented, No acute distress. HEENT: [place] AT, moist mucus membranes.  Trachea midline, no masses. Cardiovascular: No clubbing, cyanosis, or edema. Respiratory: Normal respiratory effort, no increased work of breathing. GI: Abdomen is soft, nontender, nondistended, no abdominal masses GU: No CVA tenderness.  Lymph: No cervical or inguinal lymphadenopathy. Skin: No rashes, bruises or suspicious lesions. Neurologic: Grossly intact, no focal deficits, moving all 4 extremities. Psychiatric: Normal mood and affect.   Laboratory Data: Recent Labs       Lab Results  Component Value Date    WBC 4.9 03/02/2023    HGB 14.3 03/02/2023    HCT 44.6 03/02/2023    MCV 87.5 03/02/2023    PLT 269 03/02/2023        Recent Labs       Lab Results  Component Value Date    CREATININE 1.02 03/02/2023        Recent Labs  No results found for: "PSA"     Recent Labs  No results found for: "TESTOSTERONE"     Recent Labs       Lab Results  Component Value Date    HGBA1C 6.3 (H) 04/26/2021        Urinalysis Labs (Brief)          Component Value Date/Time    COLORURINE YELLOW 09/11/2015 1012    APPEARANCEUR CLOUDY (A) 09/11/2015 1012    LABSPEC 1.020 09/11/2015 1012    PHURINE 6.0 09/11/2015 1012    GLUCOSEU NEGATIVE 09/11/2015 1012    HGBUR LARGE (A) 09/11/2015 1012  BILIRUBINUR  NEGATIVE 09/11/2015 1012    KETONESUR NEGATIVE 09/11/2015 1012    PROTEINUR TRACE (A) 09/11/2015 1012    UROBILINOGEN 0.2 04/23/2015 1015    NITRITE POSITIVE (A) 09/11/2015 1012    LEUKOCYTESUR LARGE (A) 09/11/2015 1012        Recent Labs       Lab Results  Component Value Date    BACTERIA MANY (A) 09/11/2015        Pertinent Imaging: PSMA PET: Images reviewed and discussed with the patient  No results found for this or any previous visit.   No results found for this or any previous visit.   No results found for this or any previous visit.   No results found for this or any previous visit.   No results found for this or any previous visit.   No results found for this or any previous visit.   No results found for this or any previous visit.   No results found for this or any previous visit.     Assessment & Plan:     1. Prostate cancer (HCC) (Primary) I discussed the natural history of intermediate risk prostate cancer with the patient and the various treatment options including active surveillance, RALP, IMRT, brachytherapy, cryotherapy, HIFU and ADT.  The risks/benefits/alternatives to brachytherapy with SpaceOAR was explained to the patient and he understands and wishes to proceed with surgery

## 2024-01-10 NOTE — Transfer of Care (Signed)
 Immediate Anesthesia Transfer of Care Note  Patient: George Matthews  Procedure(s) Performed: INSERTION, RADIATION SOURCE, PROSTATE INJECTION, HYDROGEL SPACER  Patient Location: PACU  Anesthesia Type:General  Level of Consciousness: awake and patient cooperative  Airway & Oxygen Therapy: Patient Spontanous Breathing and Patient connected to face mask  Post-op Assessment: Report given to RN and Post -op Vital signs reviewed and stable  Post vital signs: Reviewed and stable  Last Vitals:  Vitals Value Taken Time  BP    Temp    Pulse    Resp 8 01/10/24 1512  SpO2    Vitals shown include unfiled device data.  Last Pain:  Vitals:   01/10/24 1101  TempSrc:   PainSc: 0-No pain      Patients Stated Pain Goal: 5 (01/10/24 1101)  Complications: No notable events documented.

## 2024-01-11 ENCOUNTER — Other Ambulatory Visit: Payer: Self-pay | Admitting: Urology

## 2024-01-11 DIAGNOSIS — C61 Malignant neoplasm of prostate: Secondary | ICD-10-CM

## 2024-01-11 NOTE — Anesthesia Postprocedure Evaluation (Signed)
 Anesthesia Post Note  Patient: George Matthews  Procedure(s) Performed: INSERTION, RADIATION SOURCE, PROSTATE INJECTION, HYDROGEL SPACER     Patient location during evaluation: PACU Anesthesia Type: General Level of consciousness: awake and alert Pain management: pain level controlled Vital Signs Assessment: post-procedure vital signs reviewed and stable Respiratory status: spontaneous breathing, nonlabored ventilation, respiratory function stable and patient connected to nasal cannula oxygen Cardiovascular status: blood pressure returned to baseline and stable Postop Assessment: no apparent nausea or vomiting Anesthetic complications: no   No notable events documented.  Last Vitals:  Vitals:   01/10/24 1600 01/10/24 1608  BP: (!) 167/72 (!) 178/90  Pulse: (!) 48 64  Resp: 13 18  Temp: (!) 36.4 C 36.4 C  SpO2: 100% 96%    Last Pain:  Vitals:   01/10/24 1608  TempSrc:   PainSc: 2                  Chonte Ricke S

## 2024-01-14 ENCOUNTER — Encounter (HOSPITAL_COMMUNITY): Payer: Self-pay | Admitting: Urology

## 2024-01-14 NOTE — Progress Notes (Signed)
  Radiation Oncology         [phone] ________________________________  Name: George Matthews MRN: [id number]  Date: 01/14/2024  DOB: 28-Dec-1950       Prostate Seed Implant  GE:XBMWUX, [place], NP  No ref. provider found  DIAGNOSIS:  73 y.o. gentleman with Stage T1c adenocarcinoma of the prostate with Gleason score of 3+4, and PSA of 12.1.   Oncology History  Prostate cancer (HCC)  06/06/2023 Cancer Staging   Staging form: Prostate, AJCC 8th Edition - Clinical stage from 06/06/2023: Stage IIB (cT1c, cN0, cM0, PSA: 12.1, Grade Group: 2) - Signed by Marcello Fennel, PA-C on 11/29/2023 Histopathologic type: Adenocarcinoma, NOS Stage prefix: Initial diagnosis Prostate specific antigen (PSA) range: 10 to 19 Gleason primary pattern: 3 Gleason secondary pattern: 4 Gleason score: 7 Histologic grading system: 5 grade system Number of biopsy cores examined: 12 Number of biopsy cores positive: 8 Location of positive needle core biopsies: Both sides   11/29/2023 Initial Diagnosis   Malignant neoplasm of prostate (HCC)     No diagnosis found.  PROCEDURE: Insertion of radioactive I-125 seeds into the prostate gland.  RADIATION DOSE: 145 Gy, definitive therapy.  TECHNIQUE: [name] was brought to the operating room with the urologist. He was placed in the dorsolithotomy position. He was catheterized and a rectal tube was inserted. The perineum was shaved, prepped and draped. The ultrasound probe was then introduced by me into the rectum to see the prostate gland.  TREATMENT DEVICE: I attached the needle grid to the ultrasound probe stand and anchor needles were placed.  3D PLANNING: The prostate was imaged in 3D using a sagittal sweep of the prostate probe. These images were transferred to the planning computer. There, the prostate, urethra and rectum were defined on each axial reconstructed image. Then, the software created an optimized 3D plan and a few seed positions were adjusted.  The quality of the plan was reviewed using [facility] information for the target and the following two organs at risk:  Urethra and Rectum.  Then the accepted plan was printed and handed off to the radiation therapist.  Under my supervision, the custom loading of the seeds and spacers was carried out using the quick loader.  These pre-loaded needles were then placed into the needle holder.Marland Kitchen  PROSTATE VOLUME STUDY:  Using transrectal ultrasound the volume of the prostate was verified to be 62.7 cc.  SPECIAL TREATMENT PROCEDURE/SUPERVISION AND HANDLING: The pre-loaded needles were then delivered by the urologist under sagittal guidance. A total of 19 needles were used to deposit 68 seeds in the prostate gland. The individual seed activity was 0.600 mCi.  SpaceOAR:  Yes  COMPLEX SIMULATION: At the end of the procedure, an anterior radiograph of the pelvis was obtained to document seed positioning and count. Cystoscopy was performed by the urologist to check the urethra and bladder.  MICRODOSIMETRY: At the end of the procedure, the patient was emitting 0.054 mR/hr at 1 meter. Accordingly, he was considered safe for hospital discharge.  PLAN: The patient will return to the radiation oncology clinic for post implant CT dosimetry in three weeks.   ________________________________  Artist Pais Kathrynn Running, M.D.

## 2024-01-16 ENCOUNTER — Telehealth: Payer: Self-pay

## 2024-01-16 ENCOUNTER — Ambulatory Visit: Payer: Medicare HMO | Admitting: Urology

## 2024-01-16 VITALS — BP 166/70 | HR 76

## 2024-01-16 DIAGNOSIS — C61 Malignant neoplasm of prostate: Secondary | ICD-10-CM | POA: Diagnosis not present

## 2024-01-16 MED ORDER — CIPROFLOXACIN HCL 500 MG PO TABS
500.0000 mg | ORAL_TABLET | Freq: Once | ORAL | Status: AC
  Administered 2024-01-16: 500 mg via ORAL

## 2024-01-16 NOTE — Progress Notes (Signed)
 01/16/2024 11:37 AM   George Matthews 08/13/1951 [id number]  Referring provider: [name], NP [address] [place],  [place] [address]  No chief complaint on file.   HPI: S/p brachytherapy with SpaceOAR. Voiding trail passed today no other complaints   PMH: Past Medical History:  Diagnosis Date   Anxiety    Bradycardia    CAD (coronary artery disease)    Chronic anticoagulation    DJD (degenerative joint disease)    DVT (deep venous thrombosis) (HCC) 06/25/2014   ED (erectile dysfunction)    GERD (gastroesophageal reflux disease)    HTN (hypertension)    Polio    right leg/arm weakness   Pulmonary embolism (HCC) 06/25/2014   Somatic dysfunction    Stroke (HCC)    affected vision and speech mildly   Tubular adenoma of colon 2002    Surgical History: Past Surgical History:  Procedure Laterality Date   COLONOSCOPY  2002   Dr. Rehman--> Single diverticulum at the splenic flexure, tiny polyp at transverse colon, tubular adenomatous polyp.   COLONOSCOPY  08/23/2012   Procedure: COLONOSCOPY;  Surgeon: West Bali, MD;  [facility];  Service: Endoscopy;  Laterality: N/A;  10:15   COLONOSCOPY WITH PROPOFOL N/A 10/01/2020   Procedure: COLONOSCOPY WITH PROPOFOL;  Surgeon: Dolores Frame, MD;  [facility]: [facility];  Service: Gastroenterology;  Laterality: N/A;  7:30 am   LEFT HEART CATH AND CORONARY ANGIOGRAPHY N/A 04/26/2021   Procedure: LEFT HEART CATH AND CORONARY ANGIOGRAPHY;  Surgeon: Swazil[place], Peter M, MD;  Location: [facility] INVASIVE CV LAB;  Service: Cardiovascular;  Laterality: N/A;   POLYPECTOMY  10/01/2020   Procedure: POLYPECTOMY;  Surgeon: Dolores Frame, MD;  [facility]: [facility];  Service: Gastroenterology;;   RADIOACTIVE SEED IMPLANT N/A 01/10/2024   Procedure: INSERTION, RADIATION SOURCE, PROSTATE;  Surgeon: [name], MD;  Location: [facility];  Service: Urology;  Laterality: N/A;  68 seeds implanted. No seeds  seen in bladder on cystoscopy,   SPACE OAR INSTILLATION N/A 01/10/2024   Procedure: INJECTION, HYDROGEL SPACER;  Surgeon: [name], MD;  Location: [facility];  Service: Urology;  Laterality: N/A;    Home Medications:  Allergies as of 01/16/2024       Reactions   Codeine Itching   Atorvastatin Itching        Medication List        Accurate as of January 16, 2024 11:37 AM. If you have any questions, ask your nurse or doctor.          acetaminophen 500 MG tablet Commonly known as: TYLENOL Take 500 mg by mouth every 6 (six) hours as needed for mild pain or moderate pain.   allopurinol 300 MG tablet Commonly known as: ZYLOPRIM Take 300 mg by mouth daily.   ascorbic acid 500 MG tablet Commonly known as: VITAMIN C Take 500 mg by mouth daily.   aspirin EC 81 MG tablet Take 1 tablet (81 mg total) by mouth daily. Swallow whole.   atorvastatin 80 MG tablet Commonly known as: LIPITOR Take 1 tablet (80 mg total) by mouth daily.   ergocalciferol 1.25 MG (50000 UT) capsule Commonly known as: VITAMIN D2 Take 50,000 Units by mouth 2 (two) times a week.   ezetimibe 10 MG tablet Commonly known as: ZETIA Take 1 tablet (10 mg total) by mouth daily.   famotidine 20 MG tablet Commonly known as: PEPCID TAKE 1 TABLET (20 MG) DAILY IF NEEDED  FOR BREAK THROUGH INDIGESTION   Fish Oil 1000 MG Caps Take 1,000 mg by mouth daily.   Garlic 100 MG Tabs Take 100 mg by mouth daily.   isosorbide mononitrate 30 MG 24 hr tablet Commonly known as: IMDUR Take 1 tablet (30 mg total) by mouth daily. PATIENT MUST SCHEDULE APPOINTMENT FOR FUTURE REFILLS FIRST ATTEMPT   meclizine 25 MG tablet Commonly known as: ANTIVERT Take 1 tablet (25 mg total) by mouth 3 (three) times daily as needed for dizziness.   nitroGLYCERIN 0.4 MG SL tablet Commonly known as: NITROSTAT Place 1 tablet (0.4 mg total) under the tongue every 5 (five) minutes x 3 doses as needed for chest pain.   pantoprazole 40 MG  tablet Commonly known as: PROTONIX Take 1 tablet (40 mg total) by mouth daily.   rivaroxaban 20 MG Tabs tablet Commonly known as: XARELTO Take 20 mg by mouth daily with supper.   telmisartan-hydrochlorothiazide 80-12.5 MG tablet Commonly known as: MICARDIS HCT Take 1 tablet by mouth daily.   traMADol 50 MG tablet Commonly known as: Ultram Take 1 tablet (50 mg total) by mouth every 6 (six) hours as needed.        Allergies:  Allergies  Allergen Reactions   Codeine Itching   Atorvastatin Itching    Family History: Family History  Problem Relation Age of Onset   Colon cancer Father        age 63   Heart attack Father        deceased age 5   Liver disease Neg Hx     Social History:  reports that he has quit smoking. He has never been exposed to tobacco smoke. His smokeless tobacco use includes chew. He reports that he does not drink alcohol and does not use drugs.  ROS: All other review of systems were reviewed and are negative except what is noted above in HPI  Physical Exam: BP (!) 166/70   Pulse 76   Constitutional:  Alert and oriented, No acute distress. HEENT: [place] AT, moist mucus membranes.  Trachea midline, no masses. Cardiovascular: No clubbing, cyanosis, or edema. Respiratory: Normal respiratory effort, no increased work of breathing. GI: Abdomen is soft, nontender, nondistended, no abdominal masses GU: No CVA tenderness.  Lymph: No cervical or inguinal lymphadenopathy. Skin: No rashes, bruises or suspicious lesions. Neurologic: Grossly intact, no focal deficits, moving all 4 extremities. Psychiatric: Normal mood and affect.  Laboratory Data: Lab Results  Component Value Date   WBC 5.4 12/24/2023   HGB 13.4 12/24/2023   HCT 43.0 12/24/2023   MCV 93.5 12/24/2023   PLT 320 12/24/2023    Lab Results  Component Value Date   CREATININE 1.13 12/27/2023    No results found for: "PSA"  No results found for: "TESTOSTERONE"  Lab Results  Component  Value Date   HGBA1C 6.3 (H) 04/26/2021    Urinalysis    Component Value Date/Time   COLORURINE YELLOW 09/11/2015 1012   APPEARANCEUR Clear 11/14/2023 1350   LABSPEC 1.020 09/11/2015 1012   PHURINE 6.0 09/11/2015 1012   GLUCOSEU Negative 11/14/2023 1350   HGBUR LARGE (A) 09/11/2015 1012   BILIRUBINUR Negative 11/14/2023 1350   KETONESUR NEGATIVE 09/11/2015 1012   PROTEINUR Negative 11/14/2023 1350   PROTEINUR TRACE (A) 09/11/2015 1012   UROBILINOGEN 0.2 04/23/2015 1015   NITRITE Negative 11/14/2023 1350   NITRITE POSITIVE (A) 09/11/2015 1012   LEUKOCYTESUR Negative 11/14/2023 1350    Lab Results  Component Value Date   LABMICR Comment  11/14/2023   BACTERIA MANY (A) 09/11/2015    Pertinent Imaging:  No results found for this or any previous visit.  No results found for this or any previous visit.  No results found for this or any previous visit.  No results found for this or any previous visit.  No results found for this or any previous visit.  No results found for this or any previous visit.  No results found for this or any previous visit.  No results found for this or any previous visit.   Assessment & Plan:    1. Prostate cancer (HCC) (Primary) [place] 3 months with PSA - Bladder Voiding Trial - ciprofloxacin (CIPRO) tablet 500 mg   No follow-ups on file.  [name], MD  [facility]rology [place]

## 2024-01-16 NOTE — Telephone Encounter (Signed)
 Called Pt to let him know his Aprill 22 appt and May 2 appt were cancelled due to him having surgery

## 2024-01-16 NOTE — Progress Notes (Signed)
 Fill and Pull Catheter Removal  Patient is present today for a catheter removal.  150 ml of sterile water was instilled into the bladder when the patient felt the urge to urinate. 100 ml of water was then drained from the balloon.  A 16FR foley cath was removed from the bladder no complications were noted .  Foley catheter intact and time of removal. Patient as then given some time to void on their own.  Patient can void  on their own after some time.  Patient tolerated well.  One oral prophylactic antibiotic given per MD orders  Performed by: Guss Bunde, CMA  Follow up/ Additional notes: MD to see after

## 2024-01-17 NOTE — Progress Notes (Signed)
 Patient was a RadOnc Consult on 11/29/23 for his stage T1c adenocarcinoma of the prostate with Gleason score of 3+4, and PSA of 12.1. Patient proceed with treatment recommendations of brachytherapy and had his treatment on 01/10/24.   Patient is scheduled for a post seed CT Simulation on 4/17 and follow up with urology on 4/2 and will have his first post treatment PSA on 04/23/24.

## 2024-01-23 ENCOUNTER — Ambulatory Visit: Admitting: Urology

## 2024-01-23 VITALS — BP 159/72 | HR 71 | Temp 100.0°F

## 2024-01-23 DIAGNOSIS — R300 Dysuria: Secondary | ICD-10-CM

## 2024-01-23 DIAGNOSIS — R3915 Urgency of urination: Secondary | ICD-10-CM

## 2024-01-23 DIAGNOSIS — R350 Frequency of micturition: Secondary | ICD-10-CM | POA: Diagnosis not present

## 2024-01-23 DIAGNOSIS — R82998 Other abnormal findings in urine: Secondary | ICD-10-CM

## 2024-01-23 DIAGNOSIS — N390 Urinary tract infection, site not specified: Secondary | ICD-10-CM

## 2024-01-23 LAB — BLADDER SCAN AMB NON-IMAGING: Scan Result: 70

## 2024-01-23 MED ORDER — NITROFURANTOIN MONOHYD MACRO 100 MG PO CAPS
100.0000 mg | ORAL_CAPSULE | Freq: Two times a day (BID) | ORAL | 0 refills | Status: DC
Start: 2024-01-23 — End: 2024-03-07

## 2024-01-23 NOTE — Progress Notes (Signed)
post void residual=70 

## 2024-01-23 NOTE — Progress Notes (Signed)
 01/23/2024 4:20 PM   George Matthews 03/02/1951 [id number]  Referring provider: Lenn Quint, NP [id number] [place],  [place] [address]  dysuria  HPI: George Matthews is a 73yo here for evaluation of dysuria. Starting Sunday he developed worsening urinary urgency, frequency and mild dysuria. UA is concerning for infection. No hematuria. No other complaints today   PMH: Past Medical History:  Diagnosis Date   Anxiety    Bradycardia    CAD (coronary artery disease)    Chronic anticoagulation    DJD (degenerative joint disease)    DVT (deep venous thrombosis) (HCC) 06/25/2014   ED (erectile dysfunction)    GERD (gastroesophageal reflux disease)    HTN (hypertension)    Polio    right leg/arm weakness   Pulmonary embolism (HCC) 06/25/2014   Somatic dysfunction    Stroke [facility])    affected vision and speech mildly   Tubular adenoma of colon 2002    Surgical History: Past Surgical History:  Procedure Laterality Date   COLONOSCOPY  2002   Dr. Rehman--> Single diverticulum at the splenic flexure, tiny polyp at transverse colon, tubular adenomatous polyp.   COLONOSCOPY  08/23/2012   Procedure: COLONOSCOPY;  Surgeon: Alyce Jubilee, MD;  Location: [facility];  Service: Endoscopy;  Laterality: N/A;  10:15   COLONOSCOPY WITH PROPOFOL  N/A 10/01/2020   Procedure: COLONOSCOPY WITH PROPOFOL ;  Surgeon: Urban Garden, MD;  Location: [facility];  Service: Gastroenterology;  Laterality: N/A;  7:30 am   LEFT HEART CATH AND CORONARY ANGIOGRAPHY N/A 04/26/2021   Procedure: LEFT HEART CATH AND CORONARY ANGIOGRAPHY;  Surgeon: Swaziland, Peter M, MD;  Location: [facility] INVASIVE CV LAB;  Service: Cardiovascular;  Laterality: N/A;   POLYPECTOMY  10/01/2020   Procedure: POLYPECTOMY;  Surgeon: Urban Garden, MD;  Location: [facility];  Service: Gastroenterology;;   RADIOACTIVE SEED IMPLANT N/A 01/10/2024   Procedure: INSERTION, RADIATION SOURCE, PROSTATE;  Surgeon: Marco Severs, MD;  Location: [facility];  Service: Urology;  Laterality: N/A;  68 seeds implanted. No seeds seen in bladder on cystoscopy,   SPACE OAR INSTILLATION N/A 01/10/2024   Procedure: INJECTION, HYDROGEL SPACER;  Surgeon: Marco Severs, MD;  Location: [facility];  Service: Urology;  Laterality: N/A;    Home Medications:  Allergies as of 01/23/2024       Reactions   Codeine Itching   Atorvastatin  Itching        Medication List        Accurate as of January 23, 2024  4:20 PM. If you have any questions, ask your nurse or doctor.          acetaminophen  500 MG tablet Commonly known as: TYLENOL  Take 500 mg by mouth every 6 (six) hours as needed for mild pain or moderate pain.   allopurinol  300 MG tablet Commonly known as: ZYLOPRIM  Take 300 mg by mouth daily.   ascorbic acid 500 MG tablet Commonly known as: VITAMIN C Take 500 mg by mouth daily.   aspirin  EC 81 MG tablet Take 1 tablet (81 mg total) by mouth daily. Swallow whole.   atorvastatin  80 MG tablet Commonly known as: LIPITOR  Take 1 tablet (80 mg total) by mouth daily.   ergocalciferol 1.25 MG (50000 UT) capsule Commonly known as: VITAMIN D2 Take 50,000 Units by mouth 2 (two) times a week.   ezetimibe  10 MG tablet Commonly known as: ZETIA  Take 1 tablet (10 mg total) by mouth daily.  famotidine  20 MG tablet Commonly known as: PEPCID  TAKE 1 TABLET (20 MG) DAILY IF NEEDED FOR BREAK THROUGH INDIGESTION   Fish Oil 1000 MG Caps Take 1,000 mg by mouth daily.   Garlic 100 MG Tabs Take 100 mg by mouth daily.   isosorbide  mononitrate 30 MG 24 hr tablet Commonly known as: IMDUR  Take 1 tablet (30 mg total) by mouth daily. PATIENT MUST SCHEDULE APPOINTMENT FOR FUTURE REFILLS FIRST ATTEMPT   meclizine  25 MG tablet Commonly known as: ANTIVERT  Take 1 tablet (25 mg total) by mouth 3 (three) times daily as needed for dizziness.   nitroGLYCERIN  0.4 MG SL tablet Commonly known as: NITROSTAT  Place 1 tablet (0.4 mg  total) under the tongue every 5 (five) minutes x 3 doses as needed for chest pain.   pantoprazole  40 MG tablet Commonly known as: PROTONIX  Take 1 tablet (40 mg total) by mouth daily.   rivaroxaban  20 MG Tabs tablet Commonly known as: XARELTO  Take 20 mg by mouth daily with supper.   telmisartan-hydrochlorothiazide  80-12.5 MG tablet Commonly known as: MICARDIS HCT Take 1 tablet by mouth daily.   traMADol  50 MG tablet Commonly known as: Ultram  Take 1 tablet (50 mg total) by mouth every 6 (six) hours as needed.        Allergies:  Allergies  Allergen Reactions   Codeine Itching   Atorvastatin  Itching    Family History: Family History  Problem Relation Age of Onset   Colon cancer Father        age 6   Heart attack Father        deceased age 11   Liver disease Neg Hx     Social History:  reports that he has quit smoking. He has never been exposed to tobacco smoke. His smokeless tobacco use includes chew. He reports that he does not drink alcohol and does not use drugs.  ROS: All other review of systems were reviewed and are negative except what is noted above in HPI  Physical Exam: BP (!) 159/72   Pulse 71   Temp 100 F (37.8 C)   Constitutional:  Alert and oriented, No acute distress. HEENT: [place] AT, moist mucus membranes.  Trachea midline, no masses. Cardiovascular: No clubbing, cyanosis, or edema. Respiratory: Normal respiratory effort, no increased work of breathing. GI: Abdomen is soft, nontender, nondistended, no abdominal masses GU: No CVA tenderness.  Lymph: No cervical or inguinal lymphadenopathy. Skin: No rashes, bruises or suspicious lesions. Neurologic: Grossly intact, no focal deficits, moving all 4 extremities. Psychiatric: Normal mood and affect.  Laboratory Data: Lab Results  Component Value Date   WBC 5.4 12/24/2023   HGB 13.4 12/24/2023   HCT 43.0 12/24/2023   MCV 93.5 12/24/2023   PLT 320 12/24/2023    Lab Results  Component Value Date    CREATININE 1.13 12/27/2023    No results found for: "PSA"  No results found for: "TESTOSTERONE"  Lab Results  Component Value Date   HGBA1C 6.3 (H) 04/26/2021    Urinalysis    Component Value Date/Time   COLORURINE YELLOW 09/11/2015 1012   APPEARANCEUR Clear 11/14/2023 1350   LABSPEC 1.020 09/11/2015 1012   PHURINE 6.0 09/11/2015 1012   GLUCOSEU Negative 11/14/2023 1350   HGBUR LARGE (A) 09/11/2015 1012   BILIRUBINUR Negative 11/14/2023 1350   KETONESUR NEGATIVE 09/11/2015 1012   PROTEINUR Negative 11/14/2023 1350   PROTEINUR TRACE (A) 09/11/2015 1012   UROBILINOGEN 0.2 04/23/2015 1015   NITRITE Negative 11/14/2023 1350   NITRITE  POSITIVE (A) 09/11/2015 1012   LEUKOCYTESUR Negative 11/14/2023 1350    Lab Results  Component Value Date   LABMICR Comment 11/14/2023   BACTERIA MANY (A) 09/11/2015    Pertinent Imaging:  No results found for this or any previous visit.  No results found for this or any previous visit.  No results found for this or any previous visit.  No results found for this or any previous visit.  No results found for this or any previous visit.  No results found for this or any previous visit.  No results found for this or any previous visit.  No results found for this or any previous visit.   Assessment & Plan:    1. Urinary tract infection without hematuria, site unspecified (Primary) Urine for culture Maxcrobid 100mg  BID for 7 days - Urinalysis, Routine w reflex microscopic - Urine Culture - BLADDER SCAN AMB NON-IMAGING   No follow-ups on file.  Johnie Nailer, MD  [facility] [place]

## 2024-01-24 LAB — URINALYSIS, ROUTINE W REFLEX MICROSCOPIC
Bilirubin, UA: NEGATIVE
Glucose, UA: NEGATIVE
Ketones, UA: NEGATIVE
Nitrite, UA: POSITIVE — AB
Specific Gravity, UA: 1.015 (ref 1.005–1.030)
Urobilinogen, Ur: 1 mg/dL (ref 0.2–1.0)
pH, UA: 6 (ref 5.0–7.5)

## 2024-01-24 LAB — MICROSCOPIC EXAMINATION
RBC, Urine: >30 /HPF — AB (ref 0–2)
WBC, UA: >30 /HPF — AB (ref 0–5)

## 2024-01-27 LAB — URINE CULTURE

## 2024-01-29 ENCOUNTER — Telehealth: Payer: Self-pay | Admitting: *Deleted

## 2024-01-29 NOTE — Telephone Encounter (Signed)
 CALLED PATIENT TO REMIND OF POST SEED APPTS. AND MRI FOR 01-31-24, INFORMED PATIENT TO BE NPO- FOR SCAN, SPOKE WITH PATIENT AND HE IS AWARE OF THESE APPTS. AND THE INSTRUCTIONS

## 2024-01-29 NOTE — Progress Notes (Signed)
 Post-seed nursing interview for a diagnosis of Stage T1c adenocarcinoma of the prostate with Gleason score of 3+4, and PSA of 12.1.   Patient identity verified x2.   Patient reports polyuria, dysuria 5/10, and nocturia x4.  All related body systems reviewed w/ patient.  Patient denies all other related issues at this time.  Meaningful use complete.  I-PSS (AUA) score- 27 - Severe SHIM (ED) score- 2. No sexual activity within the last 6 months. Urinary Management medication(s) None Urology appointment date- 01/2024 with Dr. Johnie Nailer at [facility] [place]  Vitals- BP (!) 152/78 (BP Location: Left Arm, Patient Position: Sitting, Cuff Size: Normal)   Pulse 63   Temp (!) 97.3 F (36.3 C) (Temporal)   Resp 18   Ht 6' 3.5" (1.918 m)   Wt 240 lb 2 oz (108.9 kg)   SpO2 98%   BMI 29.62 kg/m   This concludes the interaction.  George Bodo, LPN

## 2024-01-30 NOTE — Progress Notes (Signed)
 Radiation Oncology         [phone] ________________________________  Name: George Matthews MRN: [id number]  Date: 01/31/2024  DOB: 1951-09-10  Post-Seed Follow-Up Visit Note  CC: Lenn Quint, NP  Lenn Quint, NP  Diagnosis:    73 y.o. gentleman with Stage T1c adenocarcinoma of the prostate with Gleason score of 3+4, and PSA of 12.1.     ICD-10-CM   1. Prostate cancer (HCC)  C61       Interval Since Last Radiation:  3 weeks 01/10/24:  Insertion of radioactive I-125 seeds into the prostate gland; 145 Gy, definitive therapy with placement of SpaceOAR gel.  Narrative:  The patient returns today for routine follow-up.  He is complaining of increased urinary frequency and urinary hesitation symptoms. He filled out a questionnaire regarding urinary function today providing and overall IPSS score of 27 characterizing his symptoms as severe with increased frequency, urgency, hesitancy, straining to void, incomplete bladder emptying, nocturia x4 and dysuria at the start of the stream.  He does feel like his LUTS are slowly improving and specifically denies gross hematuria, fever, chills or night sweats.  His pre-implant score was 1. He denies any abdominal pain or bowel symptoms.  He has noticed some minimal decrease in his energy level but overall, is pleased with his progress to date.  ALLERGIES:  is allergic to codeine and atorvastatin.  Meds: Current Outpatient Medications  Medication Sig Dispense Refill   acetaminophen (TYLENOL) 500 MG tablet Take 500 mg by mouth every 6 (six) hours as needed for mild pain or moderate pain.     allopurinol (ZYLOPRIM) 300 MG tablet Take 300 mg by mouth daily.      ascorbic acid (VITAMIN C) 500 MG tablet Take 500 mg by mouth daily.     aspirin EC 81 MG EC tablet Take 1 tablet (81 mg total) by mouth daily. Swallow whole. 30 tablet 11   atorvastatin (LIPITOR) 80 MG tablet Take 1 tablet (80 mg total) by mouth daily. 100 tablet 2   ergocalciferol  (VITAMIN D2) 50000 UNITS capsule Take 50,000 Units by mouth 2 (two) times a week.     ezetimibe (ZETIA) 10 MG tablet Take 1 tablet (10 mg total) by mouth daily. 90 tablet 3   famotidine (PEPCID) 20 MG tablet TAKE 1 TABLET (20 MG) DAILY IF NEEDED FOR BREAK THROUGH INDIGESTION 30 tablet 6   Garlic 100 MG TABS Take 100 mg by mouth daily.     isosorbide mononitrate (IMDUR) 30 MG 24 hr tablet Take 1 tablet (30 mg total) by mouth daily. PATIENT MUST SCHEDULE APPOINTMENT FOR FUTURE REFILLS FIRST ATTEMPT 30 tablet 0   meclizine (ANTIVERT) 25 MG tablet Take 1 tablet (25 mg total) by mouth 3 (three) times daily as needed for dizziness. 30 tablet 0   nitrofurantoin, macrocrystal-monohydrate, (MACROBID) 100 MG capsule Take 1 capsule (100 mg total) by mouth every 12 (twelve) hours. 14 capsule 0   nitroGLYCERIN (NITROSTAT) 0.4 MG SL tablet Place 1 tablet (0.4 mg total) under the tongue every 5 (five) minutes x 3 doses as needed for chest pain. 25 tablet 2   Omega-3 Fatty Acids (FISH OIL) 1000 MG CAPS Take 1,000 mg by mouth daily.     pantoprazole (PROTONIX) 40 MG tablet Take 1 tablet (40 mg total) by mouth daily. 30 tablet 6   rivaroxaban (XARELTO) 20 MG TABS tablet Take 20 mg by mouth daily with supper.     telmisartan-hydrochlorothiazide (MICARDIS HCT) 80-12.5 MG tablet  Take 1 tablet by mouth daily.     traMADol (ULTRAM) 50 MG tablet Take 1 tablet (50 mg total) by mouth every 6 (six) hours as needed. 15 tablet 0   No current facility-administered medications for this encounter.    Physical Findings: In general this is a well appearing African American male in no acute distress. He's alert and oriented x4 and appropriate throughout the examination. Cardiopulmonary assessment is negative for acute distress and he exhibits normal effort.   Lab Findings: Lab Results  Component Value Date   WBC 5.4 12/24/2023   HGB 13.4 12/24/2023   HCT 43.0 12/24/2023   MCV 93.5 12/24/2023   PLT 320 12/24/2023     Radiographic Findings:  Patient underwent CT imaging in our clinic for post implant dosimetry. The CT will be fused with his prostate MRI that will be performed today and will be reviewed by Dr. Lorri Rota to confirm there is an adequate distribution of radioactive seeds throughout the prostate gland and ensure that there are no seeds in or near the rectum. We suspect the final radiation plan and dosimetry will show appropriate coverage of the prostate gland. He understands that we will call and inform him of any unexpected findings on further review of his imaging and dosimetry.  Impression/Plan:  73 y.o. gentleman with Stage T1c adenocarcinoma of the prostate with Gleason score of 3+4, and PSA of 12.1.  The patient is recovering from the effects of radiation. His urinary symptoms should gradually improve over the next 4-6 months. We talked about this today and I have sent a prescription for Flomax daily to his pharmacy to see if this will help make his LUTS more manageable. He is encouraged by his improvement already and is otherwise pleased with his outcome. We also talked about long-term follow-up for prostate cancer following seed implant. He understands that ongoing PSA determinations and digital rectal exams will help perform surveillance to rule out disease recurrence. He has a follow up appointment scheduled with Dr. Claretta Croft on 02/11/24. He understands what to expect with his PSA measures. Patient was also educated today about some of the long-term effects from radiation including a small risk for rectal bleeding and possibly erectile dysfunction. We talked about some of the general management approaches to these potential complications. However, I did encourage the patient to contact our office or return at any point if he has questions or concerns related to his previous radiation and prostate cancer.    Arta Bihari, PA-C

## 2024-01-30 NOTE — Progress Notes (Signed)
  Radiation Oncology         [phone] ________________________________  Name: George Matthews MRN: [id number]  Date: 01/31/2024  DOB: [birth year]  COMPLEX SIMULATION NOTE  NARRATIVE:  The patient was brought to the CT Simulation planning suite today following prostate seed implantation approximately one month ago.  Identity was confirmed.  All relevant records and images related to the planned course of therapy were reviewed.  Then, the patient was set-up supine.  CT images were obtained.  The CT images were loaded into the planning software.  Then the prostate and rectum were contoured.  Treatment planning then occurred.  The implanted iodine 125 seeds were identified by the physics staff for projection of radiation distribution  I have requested : 3D Simulation  I have requested a DVH of the following structures: Prostate and rectum.    ________________________________  Trilby Fujisawa Lorri Rota, M.D.

## 2024-01-31 ENCOUNTER — Telehealth: Payer: Self-pay | Admitting: Radiation Oncology

## 2024-01-31 ENCOUNTER — Encounter: Payer: Self-pay | Admitting: Urology

## 2024-01-31 ENCOUNTER — Ambulatory Visit (HOSPITAL_COMMUNITY): Admission: RE | Admit: 2024-01-31 | Source: Ambulatory Visit

## 2024-01-31 ENCOUNTER — Ambulatory Visit
Admission: RE | Admit: 2024-01-31 | Discharge: 2024-01-31 | Disposition: A | Discharge status: Home / Self Care | Source: Ambulatory Visit | Attending: Radiation Oncology | Admitting: Radiation Oncology

## 2024-01-31 ENCOUNTER — Ambulatory Visit
Admission: RE | Admit: 2024-01-31 | Discharge: 2024-01-31 | Disposition: A | Discharge status: Home / Self Care | Payer: Self-pay | Source: Ambulatory Visit | Attending: Urology | Admitting: Urology

## 2024-01-31 VITALS — BP 152/78 | HR 63 | Temp 97.3°F | Resp 18 | Ht 75.5 in | Wt 240.1 lb

## 2024-01-31 DIAGNOSIS — C61 Malignant neoplasm of prostate: Secondary | ICD-10-CM | POA: Diagnosis present

## 2024-01-31 MED ORDER — TAMSULOSIN HCL 0.4 MG PO CAPS: 0.4000 mg | ORAL_CAPSULE | Freq: Every day | ORAL | 2 refills | Status: DC

## 2024-01-31 NOTE — Telephone Encounter (Signed)
 4/17 @ 10:55 am Faxed outgoing referral to [facility].

## 2024-02-01 ENCOUNTER — Ambulatory Visit (HOSPITAL_COMMUNITY)
Admission: RE | Admit: 2024-02-01 | Discharge: 2024-02-01 | Disposition: A | Discharge status: Home / Self Care | Source: Ambulatory Visit | Attending: Urology | Admitting: Urology

## 2024-02-01 DIAGNOSIS — C61 Malignant neoplasm of prostate: Secondary | ICD-10-CM | POA: Insufficient documentation

## 2024-02-03 ENCOUNTER — Encounter: Payer: Self-pay | Admitting: Urology

## 2024-02-03 NOTE — Patient Instructions (Signed)
 Asymptomatic Bacteriuria Asymptomatic bacteriuria is when you have a lot of germs called bacteria in your pee (urine), but they don't cause symptoms.  You may not need treatment for this condition. But you may be more likely to get an infection in the future. What are the causes? You may get more germs in your pee because of: Germs going into your urinary tract. Your urinary tract includes your kidneys, ureters, bladder, and urethra. Germs can get into your urinary tract during sex. A blockage in your urinary tract. This may be from a kidney stone or from an abnormal growth of cells called a tumor. Bladder problems that stop the bladder from emptying. What increases the risk? You're more likely to get this condition if: You have diabetes. You're an older adult. You're even more likely to get it if you live in a long-term care facility. You're male. You're pregnant. You have kidney stones. You've had a kidney transplant. You have a soft tube called a catheter that drains your pee. What are the signs or symptoms? There are no symptoms. How is this diagnosed? This condition is diagnosed with a pee test. It may be found when a sample of pee is taken to treat another condition, such as a problem with your kidney. If you're in your first trimester of pregnancy, you may be screened for this condition. How is this treated? In most cases, treatment isn't needed. Treating the condition can lead to other problems, such as: A yeast infection. The growth of antibiotic-resistant bacteria. These are germs that don't respond to treatment. But some people need to take antibiotics to prevent a kidney infection. You may need treatment if: You're having a procedure that affects your urinary tract. You've had a kidney transplant. You're pregnant. A kidney infection during pregnancy can lead to: Early labor. Very low birth weight. Newborn death. Follow these instructions at home: Medicines Take your  medicines only as told. Take your antibiotics as told. Do not stop taking them even if you start to feel better. General instructions Closely watch your condition for any changes. Drink enough fluid to keep your pee pale yellow. Pee more often to keep your bladder empty. If you're male, keep the area around your vagina and butt clean. Wipe from front to back after you pee or poop. Use each tissue only once when you wipe. Contact a health care provider if: You have symptoms of an infection. These symptoms may include: A burning feeling, or pain when you pee. A strong need to pee. Peeing more often. Pee that turns discolored or cloudy. Blood in your pee. Pee that smells bad or odd. A fever or chills. You have very bad pain in your back or lower belly. You faint. This information is not intended to replace advice given to you by your health care provider. Make sure you discuss any questions you have with your health care provider. Document Revised: 02/06/2023 Document Reviewed: 02/06/2023 Elsevier Patient Education  2024 [employer].

## 2024-02-04 ENCOUNTER — Telehealth: Payer: Self-pay | Admitting: Urology

## 2024-02-04 NOTE — Telephone Encounter (Signed)
 Had seeds implanted and wants to know if he can mow his yard.

## 2024-02-04 NOTE — Telephone Encounter (Signed)
 Verbal from Dr. Derrick Fling, patient should be ok to mow 10 days post procedure.  Patient surgery date was on 03/27, I called patient and left a vm to call back for MD response.

## 2024-02-05 ENCOUNTER — Other Ambulatory Visit: Payer: Medicare HMO

## 2024-02-07 ENCOUNTER — Encounter: Payer: Self-pay | Admitting: *Deleted

## 2024-02-07 ENCOUNTER — Encounter: Payer: Self-pay | Admitting: Radiation Oncology

## 2024-02-07 DIAGNOSIS — C61 Malignant neoplasm of prostate: Secondary | ICD-10-CM | POA: Diagnosis not present

## 2024-02-07 NOTE — Radiation Completion Notes (Signed)
 Patient Name: CAYDYN, SPRUNG MRN: [id number] Date of Birth: Jul 15, 1951 Referring Physician: Johnie Nailer, M.D. Date of Service: 2024-02-07 Radiation Oncologist: Bartholome Ligas, M.D. [facility]                             RADIATION ONCOLOGY END OF TREATMENT NOTE     Diagnosis: C61 Malignant neoplasm of prostate Staging on 2023-06-06: Prostate cancer (HCC) T=cT1c, N=cN0, M=cM0 Intent: Curative     ==========DELIVERED PLANS==========  Prostate Seed Implant Date: 2024-01-10   Plan Name: Prostate Seed Implant Site: Prostate Technique: Radioactive Seed Implant I-125 Mode: Brachytherapy Dose Per Fraction: 145 Gy Prescribed Dose (Delivered / Prescribed): 145 Gy / 145 Gy Prescribed Fxs (Delivered / Prescribed): 1 / 1     ==========ON TREATMENT VISIT DATES========== 2024-01-10     ==========UPCOMING VISITS==========

## 2024-02-11 ENCOUNTER — Ambulatory Visit: Admitting: Urology

## 2024-02-11 ENCOUNTER — Encounter: Payer: Self-pay | Admitting: *Deleted

## 2024-02-11 VITALS — BP 145/75 | HR 82

## 2024-02-11 DIAGNOSIS — N390 Urinary tract infection, site not specified: Secondary | ICD-10-CM | POA: Diagnosis not present

## 2024-02-11 DIAGNOSIS — C61 Malignant neoplasm of prostate: Secondary | ICD-10-CM

## 2024-02-11 LAB — URINALYSIS, ROUTINE W REFLEX MICROSCOPIC
Bilirubin, UA: NEGATIVE
Glucose, UA: NEGATIVE
Ketones, UA: NEGATIVE
Nitrite, UA: POSITIVE — AB
Specific Gravity, UA: 1.025 (ref 1.005–1.030)
Urobilinogen, Ur: 0.2 mg/dL (ref 0.2–1.0)
pH, UA: 6 (ref 5.0–7.5)

## 2024-02-11 LAB — MICROSCOPIC EXAMINATION
RBC, Urine: >30 /HPF — AB (ref 0–2)
WBC, UA: >30 /HPF — AB (ref 0–5)

## 2024-02-11 LAB — BLADDER SCAN AMB NON-IMAGING: Scan Result: 38

## 2024-02-11 MED ORDER — SULFAMETHOXAZOLE-TRIMETHOPRIM 800-160 MG PO TABS: 1.0000 | ORAL_TABLET | Freq: Two times a day (BID) | ORAL | 0 refills | Status: DC

## 2024-02-11 MED ORDER — TAMSULOSIN HCL 0.4 MG PO CAPS
0.4000 mg | ORAL_CAPSULE | Freq: Every day | ORAL | 11 refills | Status: DC
Start: 2024-02-11 — End: 2024-08-15

## 2024-02-11 NOTE — Progress Notes (Signed)
post void residual= 38

## 2024-02-11 NOTE — Progress Notes (Signed)
 02/11/2024 10:42 AM   George Matthews [birth year] [id number]  Referring provider: Lenn Quint, NP [id number] [place]  [address] [place],  [place] [address]  Difficulty urinating   HPI: George Matthews is a 73yo here for followup for UTI and difficulty urinating. UA today is concerning for infection. He was treated for a UTI 2 weeks ago. He notes his LUTS have improved after macrobid  but his urine stream has become weaker. He was started on flomax  by Dr. Livia Riffle office. No dysuria. IPSS 19 QOL 4. LUTS have improved since starting flomax .    PMH: Past Medical History:  Diagnosis Date   Anxiety    Bradycardia    CAD (coronary artery disease)    Chronic anticoagulation    DJD (degenerative joint disease)    DVT (deep venous thrombosis) (HCC) 06/25/2014   ED (erectile dysfunction)    GERD (gastroesophageal reflux disease)    HTN (hypertension)    Polio    right leg/arm weakness   Pulmonary embolism (HCC) 06/25/2014   Somatic dysfunction    Stroke (HCC)    affected vision and speech mildly   Tubular adenoma of colon 2002    Surgical History: Past Surgical History:  Procedure Laterality Date   COLONOSCOPY  2002   Dr. Rehman--> Single diverticulum at the splenic flexure, tiny polyp at transverse colon, tubular adenomatous polyp.   COLONOSCOPY  08/23/2012   Procedure: COLONOSCOPY;  Surgeon: Alyce Jubilee, MD;  Location: [facility];  Service: Endoscopy;  Laterality: N/A;  10:15   COLONOSCOPY WITH PROPOFOL  N/A 10/01/2020   Procedure: COLONOSCOPY WITH PROPOFOL ;  Surgeon: Urban Garden, MD;  Location: [facility];  Service: Gastroenterology;  Laterality: N/A;  7:30 am   LEFT HEART CATH AND CORONARY ANGIOGRAPHY N/A 04/26/2021   Procedure: LEFT HEART CATH AND CORONARY ANGIOGRAPHY;  Surgeon: [place]az[place], Peter M, MD;  Location: [facility] INVASIVE CV LAB;  Service: Cardiovascular;  Laterality: N/A;   POLYPECTOMY  10/01/2020   Procedure: POLYPECTOMY;  Surgeon: Urban Garden, MD;   Location: [facility];  Service: Gastroenterology;;   RADIOACTIVE SEED IMPLANT N/A 01/10/2024   Procedure: INSERTION, RADIATION SOURCE, PROSTATE;  Surgeon: Marco Severs, MD;  Location: [facility];  Service: Urology;  Laterality: N/A;  68 seeds implanted. No seeds seen in bladder on cystoscopy,   SPACE OAR INSTILLATION N/A 01/10/2024   Procedure: INJECTION, HYDROGEL SPACER;  Surgeon: Marco Severs, MD;  Location: [facility];  Service: Urology;  Laterality: N/A;    Home Medications:  Allergies as of 02/11/2024       Reactions   Codeine Itching   Atorvastatin  Itching        Medication List        Accurate as of February 11, 2024 10:42 AM. If you have any questions, ask your nurse or doctor.          acetaminophen  500 MG tablet Commonly known as: TYLENOL  Take 500 mg by mouth every 6 (six) hours as needed for mild pain or moderate pain.   allopurinol  300 MG tablet Commonly known as: ZYLOPRIM  Take 300 mg by mouth daily.   ascorbic acid 500 MG tablet Commonly known as: VITAMIN C Take 500 mg by mouth daily.   aspirin  EC 81 MG tablet Take 1 tablet (81 mg total) by mouth daily. Swallow whole.   atorvastatin  80 MG tablet Commonly known as: LIPITOR  Take 1 tablet (80 mg total) by mouth daily.   ergocalciferol 1.25 MG (50000 UT) capsule Commonly  known as: VITAMIN D2 Take 50,000 Units by mouth 2 (two) times a week.   ezetimibe  10 MG tablet Commonly known as: ZETIA  Take 1 tablet (10 mg total) by mouth daily.   famotidine  20 MG tablet Commonly known as: PEPCID  TAKE 1 TABLET (20 MG) DAILY IF NEEDED FOR BREAK THROUGH INDIGESTION   Fish Oil 1000 MG Caps Take 1,000 mg by mouth daily.   Garlic 100 MG Tabs Take 100 mg by mouth daily.   isosorbide  mononitrate 30 MG 24 hr tablet Commonly known as: IMDUR  Take 1 tablet (30 mg total) by mouth daily. PATIENT MUST SCHEDULE APPOINTMENT FOR FUTURE REFILLS FIRST ATTEMPT   meclizine  25 MG tablet Commonly known as: ANTIVERT  Take 1  tablet (25 mg total) by mouth 3 (three) times daily as needed for dizziness.   nitrofurantoin  (macrocrystal-monohydrate) 100 MG capsule Commonly known as: MACROBID  Take 1 capsule (100 mg total) by mouth every 12 (twelve) hours.   nitroGLYCERIN  0.4 MG SL tablet Commonly known as: NITROSTAT  Place 1 tablet (0.4 mg total) under the tongue every 5 (five) minutes x 3 doses as needed for chest pain.   pantoprazole  40 MG tablet Commonly known as: PROTONIX  Take 1 tablet (40 mg total) by mouth daily.   rivaroxaban  20 MG Tabs tablet Commonly known as: XARELTO  Take 20 mg by mouth daily with supper.   tamsulosin  0.4 MG Caps capsule Commonly known as: FLOMAX  Take 1 capsule (0.4 mg total) by mouth daily after supper.   telmisartan-hydrochlorothiazide  80-12.5 MG tablet Commonly known as: MICARDIS HCT Take 1 tablet by mouth daily.   traMADol  50 MG tablet Commonly known as: Ultram  Take 1 tablet (50 mg total) by mouth every 6 (six) hours as needed.        Allergies:  Allergies  Allergen Reactions   Codeine Itching   Atorvastatin  Itching    Family History: Family History  Problem Relation Age of Onset   Colon cancer Father        age 34   Heart attack Father        deceased age 54   Liver disease Neg Hx     Social History:  reports that he has quit smoking. He has never been exposed to tobacco smoke. His smokeless tobacco use includes chew. He reports that he does not drink alcohol and does not use drugs.  ROS: All other review of systems were reviewed and are negative except what is noted above in HPI  Physical Exam: BP (!) 145/75   Pulse 82   Constitutional:  Alert and oriented, No acute distress. HEENT: [place] AT, moist mucus membranes.  Trachea midline, no masses. Cardiovascular: No clubbing, cyanosis, or edema. Respiratory: Normal respiratory effort, no increased work of breathing. GI: Abdomen is soft, nontender, nondistended, no abdominal masses GU: No CVA tenderness.   Lymph: No cervical or inguinal lymphadenopathy. Skin: No rashes, bruises or suspicious lesions. Neurologic: Grossly intact, no focal deficits, moving all 4 extremities. Psychiatric: Normal mood and affect.  Laboratory Data: Lab Results  Component Value Date   WBC 5.4 12/24/2023   HGB 13.4 12/24/2023   HCT 43.0 12/24/2023   MCV 93.5 12/24/2023   PLT 320 12/24/2023    Lab Results  Component Value Date   CREATININE 1.13 12/27/2023    No results found for: "PSA"  No results found for: "TESTOSTERONE"  Lab Results  Component Value Date   HGBA1C 6.3 (H) 04/26/2021    Urinalysis    Component Value Date/Time   COLORURINE YELLOW  09/11/2015 1012   APPEARANCEUR Cloudy (A) 01/23/2024 1620   LABSPEC 1.020 09/11/2015 1012   PHURINE 6.0 09/11/2015 1012   GLUCOSEU Negative 01/23/2024 1620   HGBUR LARGE (A) 09/11/2015 1012   BILIRUBINUR Negative 01/23/2024 1620   KETONESUR NEGATIVE 09/11/2015 1012   PROTEINUR 1+ (A) 01/23/2024 1620   PROTEINUR TRACE (A) 09/11/2015 1012   UROBILINOGEN 0.2 04/23/2015 1015   NITRITE Positive (A) 01/23/2024 1620   NITRITE POSITIVE (A) 09/11/2015 1012   LEUKOCYTESUR 2+ (A) 01/23/2024 1620    Lab Results  Component Value Date   LABMICR See below: 01/23/2024   WBCUA >30 (A) 01/23/2024   LABEPIT 0-10 01/23/2024   BACTERIA Many (A) 01/23/2024    Pertinent Imaging:  No results found for this or any previous visit.  No results found for this or any previous visit.  No results found for this or any previous visit.  No results found for this or any previous visit.  No results found for this or any previous visit.  No results found for this or any previous visit.  No results found for this or any previous visit.  No results found for this or any previous visit.   Assessment & Plan:    1. Prostate cancer (HCC) (Primary) Followup 3 months with a PSA - Urinalysis, Routine w reflex microscopic - BLADDER SCAN AMB NON-IMAGING  2. Urinary  tract infection without hematuria, site unspecified Bactrim  DS BID for 7 days     No follow-ups on file.  Johnie Nailer, MD  [facility] [place]

## 2024-02-12 ENCOUNTER — Encounter: Payer: Self-pay | Admitting: *Deleted

## 2024-02-12 NOTE — Progress Notes (Signed)
  Radiation Oncology         [phone]) [phone] ________________________________  Name: CALLETANO KURZWEIL MRN: [id number]  Date: 02/07/2024  DOB: 04-19-1951  3D Planning Note   Prostate Brachytherapy Post-Implant Dosimetry  Diagnosis: 73 y.o. gentleman with Stage T1c adenocarcinoma of the prostate with Gleason score of 3+4, and PSA of 12.1.   Narrative: On a previous date, CLEDIS SMITHWICK returned following prostate seed implantation for post implant planning. He underwent CT scan complex simulation to delineate the three-dimensional structures of the pelvis and demonstrate the radiation distribution.  Since that time, the seed localization, and complex isodose planning with dose volume histograms have now been completed.  Results:   Prostate Coverage - The dose of radiation delivered to the 90% or more of the prostate gland (D90) was 123.76% of the prescription dose. This exceeds our goal of greater than 90%. Rectal Sparing - The volume of rectal tissue receiving the prescription dose or higher was 0.0 cc. This falls under our thresholds tolerance of 1.0 cc.  Impression: The prostate seed implant appears to show adequate target coverage and appropriate rectal sparing.  Plan:  The patient will continue to follow with urology for ongoing PSA determinations. I would anticipate a high likelihood for local tumor control with minimal risk for rectal morbidity.  ________________________________  Trilby Fujisawa Lorri Rota, M.D.

## 2024-02-14 ENCOUNTER — Encounter: Payer: Self-pay | Admitting: Urology

## 2024-02-14 ENCOUNTER — Encounter: Payer: Self-pay | Admitting: *Deleted

## 2024-02-14 NOTE — Patient Instructions (Signed)
 Urinary Tract Infection, Male A urinary tract infection (UTI) is an infection in your urinary tract. The urinary tract is made up of the organs that make, store, and get rid of pee (urine) in your body. These organs include: The kidneys. The ureters. The bladder. The urethra. What are the causes? Most UTIs are caused by germs called bacteria. They may be in or near your genitals. These germs grow and cause swelling in your urinary tract. What increases the risk? You're more likely to get a UTI if: You have a soft tube called a catheter that drains your pee. You can't control when you pee or poop. You have trouble peeing because of: A prostate that's bigger than normal. A kidney stone. A urinary blockage. A nerve condition that affects your bladder. Not getting enough to drink. You're sexually active. You're an older adult. You're also more likely to get a UTI if you have other health problems, such as: Diabetes. A weak immune system. Your immune system is your body's defense system. Sickle cell disease. Injury of the spine. What are the signs or symptoms? Symptoms may include: Needing to pee right away. Peeing small amounts often. Trouble getting the stream started. Pain or burning when you pee. Blood in your pee. Pee that smells bad or odd. Pain in your belly or lower back. You may also: Feel confused. This may be the first symptom in older adults. Vomit. Not feel hungry. Feel tired or easily annoyed. Have a fever or chills. How is this diagnosed? A UTI is diagnosed based on your medical history and an exam. You may also have other tests. These may include: Pee tests. Blood tests. Tests for sexually transmitted infections (STIs). If you've had more than one UTI, you may need to have imaging studies done to find out why you keep getting them. How is this treated? A UTI can be treated by: Taking antibiotics or other medicines. Drinking enough fluid to keep your pee  pale yellow. In rare cases, a UTI can cause a very bad condition called sepsis. Sepsis may be treated in the hospital. Follow these instructions at home: Medicines Take your medicines only as told by your health care provider. If you were given antibiotics, take them as told by your provider. Do not stop taking them even if you start to feel better. General instructions Make sure you: Pee often and fully. Do not hold your pee for a long time. Pee after you have sex. Contact a health care provider if: Your symptoms don't get better after 1-2 days of taking antibiotics. Your symptoms go away and then come back. You have a fever or chills. You vomit or feel like you may vomit. Get help right away if: You have very bad pain in your back or lower belly. You faint. This information is not intended to replace advice given to you by your health care provider. Make sure you discuss any questions you have with your health care provider. Document Revised: 01/05/2023 Document Reviewed: 01/05/2023 Elsevier Patient Education  2024 [employer].

## 2024-02-15 ENCOUNTER — Ambulatory Visit: Payer: Medicare HMO | Admitting: Urology

## 2024-02-15 LAB — URINE CULTURE

## 2024-03-07 ENCOUNTER — Other Ambulatory Visit

## 2024-03-07 ENCOUNTER — Telehealth: Payer: Self-pay | Admitting: Urology

## 2024-03-07 ENCOUNTER — Telehealth: Payer: Self-pay

## 2024-03-07 ENCOUNTER — Other Ambulatory Visit: Payer: Self-pay | Admitting: Urology

## 2024-03-07 DIAGNOSIS — N390 Urinary tract infection, site not specified: Secondary | ICD-10-CM

## 2024-03-07 DIAGNOSIS — R8290 Unspecified abnormal findings in urine: Secondary | ICD-10-CM

## 2024-03-07 DIAGNOSIS — R399 Unspecified symptoms and signs involving the genitourinary system: Secondary | ICD-10-CM

## 2024-03-07 LAB — URINALYSIS, ROUTINE W REFLEX MICROSCOPIC
Bilirubin, UA: NEGATIVE
Glucose, UA: NEGATIVE
Ketones, UA: NEGATIVE
Nitrite, UA: POSITIVE — AB
Protein,UA: NEGATIVE
Specific Gravity, UA: 1.015 (ref 1.005–1.030)
Urobilinogen, Ur: 1 mg/dL (ref 0.2–1.0)
pH, UA: 6 (ref 5.0–7.5)

## 2024-03-07 LAB — MICROSCOPIC EXAMINATION
RBC, Urine: >30 /HPF — AB (ref 0–2)
WBC, UA: >30 /HPF — AB (ref 0–5)

## 2024-03-07 MED ORDER — CEPHALEXIN 500 MG PO CAPS
500.0000 mg | ORAL_CAPSULE | Freq: Two times a day (BID) | ORAL | 0 refills | Status: DC
Start: 2024-03-07 — End: 2024-03-14

## 2024-03-07 NOTE — Telephone Encounter (Signed)
 Patient was made aware and voiced understanding.

## 2024-03-07 NOTE — Telephone Encounter (Signed)
 Return call to patient. Consulted with Dr, Claretta Croft and he advised that patient come to do a urine drop off to ensure patient do not have an infection. Patient voiced understanding.

## 2024-03-07 NOTE — Telephone Encounter (Signed)
-----   Message from Lauretta Ponto sent at 03/07/2024 11:17 AM EDT ----- Lab drop off for UTI symptoms. Abnormal UA. Will check urine culture and treat empirically with Keflex while awaiting culture results and sensitivities; prescription sent.

## 2024-03-07 NOTE — Telephone Encounter (Signed)
 Had seed implants in April and he is having pain in his left groin area. He would like to talk to a nurse. It has gotten worse since yesterday.

## 2024-03-08 ENCOUNTER — Other Ambulatory Visit (HOSPITAL_BASED_OUTPATIENT_CLINIC_OR_DEPARTMENT_OTHER): Payer: Self-pay | Admitting: Cardiology

## 2024-03-09 ENCOUNTER — Emergency Department (HOSPITAL_COMMUNITY)
Admission: EM | Admit: 2024-03-09 | Discharge: 2024-03-09 | Disposition: A | Discharge status: Home / Self Care | Attending: Emergency Medicine | Admitting: Emergency Medicine

## 2024-03-09 ENCOUNTER — Other Ambulatory Visit: Payer: Self-pay

## 2024-03-09 ENCOUNTER — Emergency Department (HOSPITAL_COMMUNITY)

## 2024-03-09 ENCOUNTER — Encounter (HOSPITAL_COMMUNITY): Payer: Self-pay | Admitting: Emergency Medicine

## 2024-03-09 DIAGNOSIS — N5089 Other specified disorders of the male genital organs: Secondary | ICD-10-CM | POA: Diagnosis present

## 2024-03-09 DIAGNOSIS — N451 Epididymitis: Secondary | ICD-10-CM | POA: Diagnosis not present

## 2024-03-09 DIAGNOSIS — Z8546 Personal history of malignant neoplasm of prostate: Secondary | ICD-10-CM | POA: Diagnosis not present

## 2024-03-09 LAB — CBC WITH DIFFERENTIAL/PLATELET
Abs Immature Granulocytes: 0.03 10*3/uL (ref 0.00–0.07)
Basophils Absolute: 0 10*3/uL (ref 0.0–0.1)
Basophils Relative: 0 %
Eosinophils Absolute: 0.5 10*3/uL (ref 0.0–0.5)
Eosinophils Relative: 5 %
HCT: 38.1 % — ABNORMAL LOW (ref 39.0–52.0)
Hemoglobin: 12.5 g/dL — ABNORMAL LOW (ref 13.0–17.0)
Immature Granulocytes: 0 %
Lymphocytes Relative: 14 %
Lymphs Abs: 1.4 10*3/uL (ref 0.7–4.0)
MCH: 29.6 pg (ref 26.0–34.0)
MCHC: 32.8 g/dL (ref 30.0–36.0)
MCV: 90.1 fL (ref 80.0–100.0)
Monocytes Absolute: 0.7 10*3/uL (ref 0.1–1.0)
Monocytes Relative: 7 %
Neutro Abs: 7.6 10*3/uL (ref 1.7–7.7)
Neutrophils Relative %: 74 %
Platelets: 292 10*3/uL (ref 150–400)
RBC: 4.23 MIL/uL (ref 4.22–5.81)
RDW: 15.9 % — ABNORMAL HIGH (ref 11.5–15.5)
WBC: 10.3 10*3/uL (ref 4.0–10.5)
nRBC: 0 % (ref 0.0–0.2)

## 2024-03-09 LAB — URINALYSIS, ROUTINE W REFLEX MICROSCOPIC
Bilirubin Urine: NEGATIVE
Glucose, UA: NEGATIVE mg/dL
Ketones, ur: NEGATIVE mg/dL
Nitrite: NEGATIVE
Protein, ur: 100 mg/dL — AB
Specific Gravity, Urine: 1.033 — ABNORMAL HIGH (ref 1.005–1.030)
WBC, UA: >50 WBC/hpf (ref 0–5)
pH: 5 (ref 5.0–8.0)

## 2024-03-09 LAB — BASIC METABOLIC PANEL WITH GFR
Anion gap: 9 (ref 5–15)
BUN: 14 mg/dL (ref 8–23)
CO2: 24 mmol/L (ref 22–32)
Calcium: 8.5 mg/dL — ABNORMAL LOW (ref 8.9–10.3)
Chloride: 103 mmol/L (ref 98–111)
Creatinine, Ser: 0.99 mg/dL (ref 0.61–1.24)
GFR, Estimated: >60 mL/min (ref >60)
Glucose, Bld: 103 mg/dL — ABNORMAL HIGH (ref 70–99)
Potassium: 3.5 mmol/L (ref 3.5–5.1)
Sodium: 136 mmol/L (ref 135–145)

## 2024-03-09 MED ORDER — SODIUM CHLORIDE 0.9 % IV SOLN
1.0000 g | Freq: Once | INTRAVENOUS | Status: AC
  Administered 2024-03-09: 1 g via INTRAVENOUS
  Filled 2024-03-09: qty 10

## 2024-03-09 MED ORDER — CEPHALEXIN 500 MG PO CAPS: 500.0000 mg | ORAL_CAPSULE | Freq: Four times a day (QID) | ORAL | 0 refills | Status: AC

## 2024-03-09 MED ORDER — LEVOFLOXACIN 500 MG PO TABS
500.0000 mg | ORAL_TABLET | Freq: Once | ORAL | Status: AC
Start: 2024-03-09 — End: 2024-03-09
  Administered 2024-03-09: 500 mg via ORAL
  Filled 2024-03-09: qty 1

## 2024-03-09 MED ORDER — LEVOFLOXACIN 500 MG PO TABS: 500.0000 mg | ORAL_TABLET | Freq: Every day | ORAL | 0 refills | Status: DC

## 2024-03-09 NOTE — Discharge Instructions (Addendum)
 You are seen in the emergency room for testicular pain.  Our workup in the ER shows that you have epididymitis.  Please start taking the antibiotics that are prescribed.   Call the urologist to set up an appointment in 7 to 10 days. Return to the emergency room with starting worsening pain, swelling, fevers, chills, severe nausea and vomiting.

## 2024-03-09 NOTE — ED Triage Notes (Signed)
 Patient present due to infection in his left testicle. He says it is where he has a seed implant. He is currently on a course of antibiotics, but has not seen improvements. His first days taking the medication was this past Friday. Patient reports swelling and pain.

## 2024-03-09 NOTE — ED Provider Notes (Signed)
 [facility] EMERGENCY DEPARTMENT AT [facility] Provider Note   CSN: [id number] Arrival date & time: 03/09/24  1312     History  Chief Complaint  Patient presents with   Testicle Pain    George Matthews is a 73 y.o. male.  HPI    72 year old male comes in with chief complaint of testicular swelling.  Patient has history of PE, prostate disease for which he had radiation seeding done on April 25.  Patient states that about 2 weeks later, he started having swelling to his testicle.  He subsequently had urine test which showed infection.  He was given antibiotics, symptoms not resolved completely.  He was seen again by the urologist few days ago and was started on Keflex .  However he thinks that nobody looked at his testicle.  He continues to have significant discomfort over the testicular region.  He is pain-free when laying flat, but has pain anytime he moves.  He denies any associated nausea, vomiting, fevers, chills.  No blood in the urine.  Home Medications Prior to Admission medications   Medication Sig Start Date End Date Taking? Authorizing Provider  cephALEXin  (KEFLEX ) 500 MG capsule Take 1 capsule (500 mg total) by mouth 4 (four) times daily. 03/09/24  Yes Deatra Face, MD  levofloxacin  (LEVAQUIN ) 500 MG tablet Take 1 tablet (500 mg total) by mouth daily. 03/09/24  Yes Deatra Face, MD  acetaminophen  (TYLENOL ) 500 MG tablet Take 500 mg by mouth every 6 (six) hours as needed for mild pain or moderate pain.    [provider]  allopurinol  (ZYLOPRIM ) 300 MG tablet Take 300 mg by mouth daily.     [provider]  ascorbic acid (VITAMIN C) 500 MG tablet Take 500 mg by mouth daily.    [provider]  aspirin  EC 81 MG EC tablet Take 1 tablet (81 mg total) by mouth daily. Swallow whole. 04/27/21   Goodrich, Callie E, PA-C  atorvastatin  (LIPITOR ) 80 MG tablet Take 1 tablet (80 mg total) by mouth daily. 06/24/21   Goodrich, Callie E, PA-C   ergocalciferol (VITAMIN D2) 50000 UNITS capsule Take 50,000 Units by mouth 2 (two) times a week.    [provider]  ezetimibe  (ZETIA ) 10 MG tablet Take 1 tablet (10 mg total) by mouth daily. 12/28/23 03/27/24  Jude Norton, NP  famotidine  (PEPCID ) 20 MG tablet TAKE 1 TABLET (20 MG) DAILY IF NEEDED FOR BREAK THROUGH INDIGESTION 06/22/22   Eilleen Grates, MD  Garlic 100 MG TABS Take 100 mg by mouth daily.    [provider]  isosorbide  mononitrate (IMDUR ) 30 MG 24 hr tablet Take 1 tablet (30 mg total) by mouth daily. PATIENT MUST SCHEDULE APPOINTMENT FOR FUTURE REFILLS FIRST ATTEMPT 01/16/23   Eilleen Grates, MD  meclizine  (ANTIVERT ) 25 MG tablet Take 1 tablet (25 mg total) by mouth 3 (three) times daily as needed for dizziness. 03/02/23   Cheyenne Cotta, MD  nitroGLYCERIN  (NITROSTAT ) 0.4 MG SL tablet Place 1 tablet (0.4 mg total) under the tongue every 5 (five) minutes x 3 doses as needed for chest pain. 04/27/21   Goodrich, Callie E, PA-C  Omega-3 Fatty Acids (FISH OIL) 1000 MG CAPS Take 1,000 mg by mouth daily.    [provider]  pantoprazole  (PROTONIX ) 40 MG tablet Take 1 tablet (40 mg total) by mouth daily. 05/31/21   Clearnce Curia, NP  rivaroxaban  (XARELTO ) 20 MG TABS tablet Take 20 mg by mouth daily with supper.  [provider]  tamsulosin  (FLOMAX ) 0.4 MG CAPS capsule Take 1 capsule (0.4 mg total) by mouth daily after supper. 02/11/24   McKenzie, Arden Beck, MD  telmisartan-hydrochlorothiazide  (MICARDIS HCT) 80-12.5 MG tablet Take 1 tablet by mouth daily.    [provider]  traMADol  (ULTRAM ) 50 MG tablet Take 1 tablet (50 mg total) by mouth every 6 (six) hours as needed. 01/10/24 01/09/25  Marco Severs, MD      Allergies    Codeine and Atorvastatin     Review of Systems   Review of Systems  All other systems reviewed and are negative.   Physical Exam Updated Vital Signs BP 132/66 (BP Location: Right Arm)   Pulse 76   Temp 98.4 F  (36.9 C) (Oral)   Resp 16   SpO2 98%  Physical Exam Vitals and nursing note reviewed.  Constitutional:      Appearance: He is well-developed.  HENT:     Head: Atraumatic.  Eyes:     Extraocular Movements: Extraocular movements intact.     Pupils: Pupils are equal, round, and reactive to light.  Cardiovascular:     Rate and Rhythm: Normal rate.  Pulmonary:     Effort: Pulmonary effort is normal.  Genitourinary:    Comments: Left testicle is indurated, edematous and tender to palpation. Musculoskeletal:     Cervical back: Neck supple.  Skin:    General: Skin is warm.  Neurological:     Mental Status: He is alert and oriented to person, place, and time.     ED Results / Procedures / Treatments   Labs (all labs ordered are listed, but only abnormal results are displayed) Labs Reviewed  CBC WITH DIFFERENTIAL/PLATELET - Abnormal; Notable for the following components:      Result Value   Hemoglobin 12.5 (*)    HCT 38.1 (*)    RDW 15.9 (*)    All other components within normal limits  BASIC METABOLIC PANEL WITH GFR - Abnormal; Notable for the following components:   Glucose, Bld 103 (*)    Calcium  8.5 (*)    All other components within normal limits  URINALYSIS, ROUTINE W REFLEX MICROSCOPIC - Abnormal; Notable for the following components:   APPearance HAZY (*)    Specific Gravity, Urine 1.033 (*)    Hgb urine dipstick SMALL (*)    Protein, ur 100 (*)    Leukocytes,Ua LARGE (*)    Bacteria, UA RARE (*)    All other components within normal limits  URINE CULTURE    EKG None  Radiology US  SCROTUM W/DOPPLER Result Date: 03/09/2024 CLINICAL DATA:  L scrotal edema and pain, recent radiation seeding EXAM: SCROTAL ULTRASOUND DOPPLER ULTRASOUND OF THE TESTICLES TECHNIQUE: Complete ultrasound examination of the testicles, epididymis, and other scrotal structures was performed. Color and spectral Doppler ultrasound were also utilized to evaluate blood flow to the testicles.  COMPARISON:  09/11/2015 FINDINGS: Right testicle Measurements: 3.8 x 1.6 x 1.7 cm. There is a 0.5 x 0.3 x 0.4 cm hypoechoic subcapsular focus. No microlithiasis. Left testicle Measurements: 2.9 x 2.1 x 2.5 cm. No mass or microlithiasis visualized. Right epididymis:  0.9 cm epididymal cyst.  No hyperemia. Left epididymis: 1.6 cm epididymal cyst. The epididymal body is mildly enlarged and hyperemic. Hydrocele:  Small moderate, complex left. Varicocele:  Small, right. Pulsed Doppler interrogation of both testes demonstrates normal low resistance arterial and venous waveforms bilaterally. IMPRESSION: 1. Mildly enlarged and hyperemic left epididymis, suggesting epididymitis. 2. Small to moderate, complex  left hydrocele. 3. No testicular mass or torsion. 4. 0.5 cm hypoechoic subcapsular focus in the right testicle. 5. Small right varicocele. Electronically Signed   By: Nicoletta Barrier M.D.   On: 03/09/2024 15:13    Procedures Procedures    Medications Ordered in ED Medications  levofloxacin  (LEVAQUIN ) tablet 500 mg (has no administration in time range)  cefTRIAXone (ROCEPHIN) 1 g in sodium chloride  0.9 % 100 mL IVPB (has no administration in time range)    ED Course/ Medical Decision Making/ A&P                                 Medical Decision Making Amount and/or Complexity of Data Reviewed Labs: ordered. Radiology: ordered.  Risk Prescription drug management.   This patient presents to the ED with chief complaint(s) of testicular pain and swelling with pertinent past medical history of recent urologic procedure /radiation seeding, currently on Keflex for UTI.The complaint involves an extensive differential diagnosis and also carries with it a high risk of complications and morbidity.    The differential diagnosis includes : Epididymitis, scrotal abscess, testicular mass, hematoma.  Clinically, low suspicion for torsion.  The initial plan is to get basic labs, ultrasound testicle. Will also get  UA.   Additional history obtained: Records reviewed recent urology notes, discharge summary, urologic procedure note  Independent labs interpretation:  The following labs were independently interpreted: Urine analysis is leukocyte positive with pyuria and hematuria.  Urine culture pending.  CBC shows normal white count.   Treatment and Reassessment: Ultrasound shows that patient has epididymitis. Results of the ED workup discussed with him.  Consultation: - Consulted or discussed management/test interpretation with external professional: Spoke with Dr. Willye Harvey. I discussed with him the recent urologic procedure he had in April and the current epididymitis despite patient being on Keflex and some other antibiotic prior to that.  Dr. Willye Harvey thinks that most likely the infection is not procedure related.  He thinks patient can be followed up closely in the outpatient setting.  We will switch patient to Levaquin  500 milligrams daily for 10 days.  Have advised that he stop taking Keflex for now.  Results of the ED workup, and the urology recommendation of close follow-up discussed with the patient.  ER return precautions also discussed.  Final Clinical Impression(s) / ED Diagnoses Final diagnoses:  Epididymitis    Rx / DC Orders ED Discharge Orders          Ordered    levofloxacin  (LEVAQUIN ) 500 MG tablet  Daily        03/09/24 1651    cephALEXin (KEFLEX) 500 MG capsule  4 times daily        03/09/24 1652              Deatra Face, MD 03/09/24 1700

## 2024-03-10 LAB — URINE CULTURE: Culture: NO GROWTH

## 2024-03-11 ENCOUNTER — Telehealth: Payer: Self-pay | Admitting: Urology

## 2024-03-11 NOTE — Telephone Encounter (Signed)
 Left message that he was seen in the ER and they told him to see McKenzie asap

## 2024-03-11 NOTE — Telephone Encounter (Signed)
 Please schedule follow up with George Matthews.

## 2024-03-11 NOTE — Telephone Encounter (Signed)
 Patient made aware a message has been sent to provider.

## 2024-03-11 NOTE — Telephone Encounter (Signed)
 If George Matthews does not have anything sooner then yes.

## 2024-03-12 ENCOUNTER — Ambulatory Visit: Payer: Self-pay | Admitting: Urology

## 2024-03-12 ENCOUNTER — Telehealth: Payer: Self-pay

## 2024-03-12 NOTE — Telephone Encounter (Signed)
 Pt called to see if we had a sooner appt w/ MD McKenzie due to his testicular pain advised pt that he's on  wait list but unfortunately the only next available was 06/27 pt declined because he wants to be seen sooner I reiterated that he is on the wait list and if something becomes available he will get a call

## 2024-03-14 ENCOUNTER — Encounter: Payer: Self-pay | Admitting: *Deleted

## 2024-03-14 NOTE — Progress Notes (Signed)
 SCP summary completed and mailed to pt.

## 2024-03-17 ENCOUNTER — Encounter: Payer: Self-pay | Admitting: *Deleted

## 2024-03-17 ENCOUNTER — Inpatient Hospital Stay: Attending: Adult Health | Admitting: *Deleted

## 2024-03-17 DIAGNOSIS — C61 Malignant neoplasm of prostate: Secondary | ICD-10-CM

## 2024-03-17 NOTE — Progress Notes (Signed)
 SCP reviewed and completed. Allergies and medications reviewed and updated.Pt  states he has had some testicular swelling and UTI which he was given antibiotics for and currently taking. He will visit Dr. Claretta Croft on June 5 to re-evaluate. Pt has not returned to exercising b/c of the swelling but looking forward to when he can. In the meantime I shared with him about doing some chair exercises. Nutrition was discussed and I recommended him to attend our virtual nutrition class this month. Last colonoscopy was 10/01/2020. Next due 09/2027. Pt will see PCP in July. I will fax PCP a copy of prostate treatment summary.

## 2024-03-19 NOTE — Progress Notes (Unsigned)
 Name: George Matthews DOB: 1951-03-26 MRN: [id number]  History of Present Illness: George Matthews is a 73 y.o. male who presents today for follow up visit at [facility] [place].  Relevant History includes: 1. Prostate cancer (high volume Gleason 3+4=7). - 01/10/2024: Brachytherapy seeds implanted with SpaceOar by Dr. Claretta Matthews. 2. LUTS. - Improved with Flomax .  Urine culture results in past 12 months: - 01/23/2024: Positive for Klebsiella oxytoca - 02/11/2024: Positive for Klebsiella oxytoca - 03/07/2024: Positive for Klebsiella oxytoca - 03/09/2024: Negative  At last visit with Dr. Claretta Matthews on 02/11/2024: - Seen for UTI.  - Urine culture positive for Klebsiella oxytoca - Plan was:  1. Bactrim  DS twice daily x7 days.  2. F/u in 3 months with PSA.  Since last visit: > 03/07/2024:  - Urine culture positive for Klebsiella oxytoca - Keflex  prescribed.  > 03/09/2024:  - Seen in ER for testicular swelling.  - Urine microscopy: >50 WBC/hpf, 21-50 RBC/hpf, rare bacteria - Urine culture negative - Diagnosed with left epididymitis.  - Antibiotics changed to Levaquin .   Today: He reports mild "stinging" when urinating and weak urinary stream. He denies urinary urgency, frequency, gross hematuria, hesitancy, straining to void, or sensations of incomplete emptying.  He reports that his scrotal swelling and redness has improved. Denies scrotal pain. Reports occasional LLQ abdominal pain. He denies constipation, diarrhea, nausea, vomiting. Denies fevers.   He reports bother related to erectile dysfunction.    Medications: Current Outpatient Medications  Medication Sig Dispense Refill   acetaminophen  (TYLENOL ) 500 MG tablet Take 500 mg by mouth every 6 (six) hours as needed for mild pain or moderate pain.     allopurinol  (ZYLOPRIM ) 300 MG tablet Take 300 mg by mouth daily.      ascorbic acid (VITAMIN C) 500 MG tablet Take 500 mg by mouth daily.     aspirin  EC 81 MG EC tablet Take  1 tablet (81 mg total) by mouth daily. Swallow whole. 30 tablet 11   atorvastatin  (LIPITOR ) 80 MG tablet Take 1 tablet (80 mg total) by mouth daily. 100 tablet 2   cephALEXin  (KEFLEX ) 500 MG capsule Take 1 capsule (500 mg total) by mouth 4 (four) times daily. 20 capsule 0   ergocalciferol (VITAMIN D2) 50000 UNITS capsule Take 50,000 Units by mouth 2 (two) times a week.     ezetimibe  (ZETIA ) 10 MG tablet Take 1 tablet (10 mg total) by mouth daily. 90 tablet 3   famotidine  (PEPCID ) 20 MG tablet TAKE 1 TABLET (20 MG) DAILY IF NEEDED FOR BREAK THROUGH INDIGESTION 30 tablet 6   Garlic 100 MG TABS Take 100 mg by mouth daily.     isosorbide  mononitrate (IMDUR ) 30 MG 24 hr tablet Take 1 tablet (30 mg total) by mouth daily. 90 tablet 3   meclizine  (ANTIVERT ) 25 MG tablet Take 1 tablet (25 mg total) by mouth 3 (three) times daily as needed for dizziness. 30 tablet 0   nitroGLYCERIN  (NITROSTAT ) 0.4 MG SL tablet Place 1 tablet (0.4 mg total) under the tongue every 5 (five) minutes x 3 doses as needed for chest pain. 25 tablet 2   Omega-3 Fatty Acids (FISH OIL) 1000 MG CAPS Take 1,000 mg by mouth daily.     pantoprazole  (PROTONIX ) 40 MG tablet Take 1 tablet (40 mg total) by mouth daily. 30 tablet 6   rivaroxaban  (XARELTO ) 20 MG TABS tablet Take 20 mg by mouth daily with supper.     tamsulosin  (FLOMAX ) 0.4 MG CAPS capsule  Take 1 capsule (0.4 mg total) by mouth daily after supper. 30 capsule 11   telmisartan-hydrochlorothiazide  (MICARDIS HCT) 80-12.5 MG tablet Take 1 tablet by mouth daily.     No current facility-administered medications for this visit.    Allergies: Allergies  Allergen Reactions   Codeine Itching   Atorvastatin  Itching    Past Medical History:  Diagnosis Date   Anxiety    Bradycardia    CAD (coronary artery disease)    Chronic anticoagulation    DJD (degenerative joint disease)    DVT (deep venous thrombosis) (HCC) 06/25/2014   ED (erectile dysfunction)    GERD (gastroesophageal  reflux disease)    HTN (hypertension)    Polio    right leg/arm weakness   Pulmonary embolism (HCC) 06/25/2014   Somatic dysfunction    Stroke (HCC)    affected vision and speech mildly   Tubular adenoma of colon 2002   Past Surgical History:  Procedure Laterality Date   COLONOSCOPY  2002   Dr. Rehman--> Single diverticulum at the splenic flexure, tiny polyp at transverse colon, tubular adenomatous polyp.   COLONOSCOPY  08/23/2012   Procedure: COLONOSCOPY;  Surgeon: George Jubilee, MD;  [facility];  Service: Endoscopy;  Laterality: N/A;  10:15   COLONOSCOPY WITH PROPOFOL  N/A 10/01/2020   Procedure: COLONOSCOPY WITH PROPOFOL ;  Surgeon: George Garden, MD;  [facility]: [facility];  Service: Gastroenterology;  Laterality: N/A;  7:30 am   LEFT HEART CATH AND CORONARY ANGIOGRAPHY N/A 04/26/2021   Procedure: LEFT HEART CATH AND CORONARY ANGIOGRAPHY;  Surgeon: Swaziland, Peter M, MD;  Location: [facility] INVASIVE CV LAB;  Service: Cardiovascular;  Laterality: N/A;   POLYPECTOMY  10/01/2020   Procedure: POLYPECTOMY;  Surgeon: George Garden, MD;  [facility]: [facility];  Service: Gastroenterology;;   RADIOACTIVE SEED IMPLANT N/A 01/10/2024   Procedure: INSERTION, RADIATION SOURCE, PROSTATE;  Surgeon: George Severs, MD;  Location: [facility];  Service: Urology;  Laterality: N/A;  68 seeds implanted. No seeds seen in bladder on cystoscopy,   SPACE OAR INSTILLATION N/A 01/10/2024   Procedure: INJECTION, HYDROGEL SPACER;  Surgeon: George Severs, MD;  Location: [facility];  Service: Urology;  Laterality: N/A;   Family History  Problem Relation Age of Onset   Colon cancer Father        age 55   Heart attack Father        deceased age 20   Liver disease Neg Hx    Social History   Socioeconomic History   Marital status: Married    Spouse name: Not on file   Number of children: 2   Years of education: Not on file   Highest education level: Not on file   Occupational History   Occupation: retired, Producer, television/film/video and devp  Tobacco Use   Smoking status: Former    Passive exposure: Never   Smokeless tobacco: Current    Types: Designer, multimedia Use   Vaping status: Never Used  Substance and Sexual Activity   Alcohol use: No   Drug use: No   Sexual activity: Not on file  Other Topics Concern   Not on file  Social History Narrative   Not on file   Social Drivers of Health   Financial Resource Strain: Not on file  Food Insecurity: No Food Insecurity (01/31/2024)   Hunger Vital Sign    Worried About Running Out of Food in the Last Year: Never true    Ran Out of  Food in the Last Year: Never true  Transportation Needs: No Transportation Needs (01/31/2024)   PRAPARE - Administrator, Civil Service (Medical): No    Lack of Transportation (Non-Medical): No  Physical Activity: Not on file  Stress: Not on file  Social Connections: Not on file  Intimate Partner Violence: Not At Risk (01/31/2024)   Humiliation, Afraid, Rape, and Kick questionnaire    Fear of Current or Ex-Partner: No    Emotionally Abused: No    Physically Abused: No    Sexually Abused: No    Review of Systems Constitutional: Patient denies any unintentional weight loss or change in strength lntegumentary: Patient denies any rashes or pruritus Cardiovascular: Patient denies chest pain or syncope Respiratory: Patient denies shortness of breath Gastrointestinal: As per HPI Musculoskeletal: Patient denies muscle cramps or weakness Neurologic: Patient denies convulsions or seizures Allergic/Immunologic: Patient denies recent allergic reaction(s) Hematologic/Lymphatic: Patient denies bleeding tendencies Endocrine: Patient denies heat/cold intolerance  GU: As per HPI.  OBJECTIVE Vitals:   03/20/24 1013  BP: (!) 146/76  Pulse: 77   There is no height or weight on file to calculate BMI.  Physical Examination Constitutional: No obvious distress; patient is  non-toxic appearing  Cardiovascular: No visible lower extremity edema.  Respiratory: The patient does not have audible wheezing/stridor; respirations do not appear labored  Gastrointestinal: Abdomen non-distended Musculoskeletal: Normal ROM of UEs  Skin: No obvious rashes/open sores  Neurologic: CN 2-12 grossly intact Psychiatric: Answered questions appropriately with normal affect  Hematologic/Lymphatic/Immunologic: No obvious bruises or sites of spontaneous bleeding  Genitourinary: Penis is normal in appearance. Uncircumcised. Left hemiscrotum is edematous and indurated; no fluctuance, erythema, warmth, rash, lesions, or tenderness to palpation. Right hemiscrotum is normal in appearance.  Urine microscopy: 6-10 WBC/hpf, 3-10 RBC/hpf, 0 bacteria PVR: 14 ml  ASSESSMENT Prostate cancer (HCC) - Plan: Urinalysis, Routine w reflex microscopic, BLADDER SCAN AMB NON-IMAGING  Complicated UTI (urinary tract infection) - Plan: Urinalysis, Routine w reflex microscopic, BLADDER SCAN AMB NON-IMAGING, Urine Culture  History of epididymitis - Plan: Urinalysis, Routine w reflex microscopic, BLADDER SCAN AMB NON-IMAGING  Left epididymitis appears to be resolving. Advised rest, application of ice, analgesics PRN, scrotal support throughout the day with supportive underwear and/or jock strap, and scrotal elevation when at rest for comfort and to promote drainage. Instructed to complete Levaquin  as prescribed.   Also continue Flomax  (Tamsulosin ) 0.4 mg daily.  For relapsing UTI: Will check urine culture again today and treat based on findings. If positive for ongoing relapsing UTI, may start daily low dose antibiotic for bacterial suppression.  Will plan for follow up as previously scheduled with Dr. Claretta Matthews on 04/23/2024 with lab visit for PSA prior. Pt verbalized understanding and agreement. All questions were answered.  PLAN Advised the following: 1. Urine culture. 2. Complete Levaquin  as  prescribed.  3. Continue Flomax  (Tamsulosin ) 0.4 mg daily. 4. Return in 5 weeks (on 04/23/2024) for as previously scheduled with Dr. Claretta Matthews.  Orders Placed This Encounter  Procedures   Urine Culture   Urinalysis, Routine w reflex microscopic   BLADDER SCAN AMB NON-IMAGING   Total time spent caring for the patient today was over 30 minutes. This includes time spent on the date of the visit reviewing the patient's chart before the visit, time spent during the visit, and time spent after the visit on documentation. Over 50% of that time was spent in face-to-face time with this patient for direct counseling. E&Matthews based on time and complexity of medical decision  making.  It has been explained that the patient is to follow regularly with their PCP in addition to all other providers involved in their care and to follow instructions provided by these respective offices. Patient advised to contact urology clinic if any urologic-pertaining questions, concerns, new symptoms or problems arise in the interim period.  There are no Patient Instructions on file for this visit.  Electronically signed by:  Lauretta Ponto, FNP   03/20/24    10:55 AM

## 2024-03-20 ENCOUNTER — Ambulatory Visit: Admitting: Urology

## 2024-03-20 ENCOUNTER — Encounter: Payer: Self-pay | Admitting: Urology

## 2024-03-20 VITALS — BP 146/76 | HR 77

## 2024-03-20 DIAGNOSIS — R3912 Poor urinary stream: Secondary | ICD-10-CM | POA: Diagnosis not present

## 2024-03-20 DIAGNOSIS — Z87438 Personal history of other diseases of male genital organs: Secondary | ICD-10-CM | POA: Diagnosis not present

## 2024-03-20 DIAGNOSIS — C61 Malignant neoplasm of prostate: Secondary | ICD-10-CM

## 2024-03-20 DIAGNOSIS — Z87440 Personal history of urinary (tract) infections: Secondary | ICD-10-CM

## 2024-03-20 DIAGNOSIS — R3989 Other symptoms and signs involving the genitourinary system: Secondary | ICD-10-CM | POA: Diagnosis not present

## 2024-03-20 DIAGNOSIS — N390 Urinary tract infection, site not specified: Secondary | ICD-10-CM

## 2024-03-20 LAB — URINALYSIS, ROUTINE W REFLEX MICROSCOPIC
Bilirubin, UA: NEGATIVE
Glucose, UA: NEGATIVE
Ketones, UA: NEGATIVE
Nitrite, UA: NEGATIVE
Protein,UA: NEGATIVE
Specific Gravity, UA: 1.02 (ref 1.005–1.030)
Urobilinogen, Ur: 1 mg/dL (ref 0.2–1.0)
pH, UA: 6 (ref 5.0–7.5)

## 2024-03-20 LAB — MICROSCOPIC EXAMINATION: Bacteria, UA: NONE SEEN

## 2024-03-20 LAB — BLADDER SCAN AMB NON-IMAGING: Scan Result: 14

## 2024-03-22 LAB — URINE CULTURE: Organism ID, Bacteria: NO GROWTH

## 2024-04-07 ENCOUNTER — Ambulatory Visit: Admitting: Urology

## 2024-04-11 ENCOUNTER — Ambulatory Visit: Admitting: Urology

## 2024-04-14 ENCOUNTER — Other Ambulatory Visit

## 2024-04-14 DIAGNOSIS — C61 Malignant neoplasm of prostate: Secondary | ICD-10-CM

## 2024-04-15 ENCOUNTER — Ambulatory Visit: Payer: Self-pay | Admitting: Urology

## 2024-04-15 LAB — PSA: Prostate Specific Ag, Serum: 0.6 ng/mL (ref 0.0–4.0)

## 2024-04-15 NOTE — Telephone Encounter (Signed)
 Letter sent.

## 2024-04-23 ENCOUNTER — Ambulatory Visit: Admitting: Urology

## 2024-04-28 ENCOUNTER — Encounter: Payer: Self-pay | Admitting: *Deleted

## 2024-04-28 NOTE — Progress Notes (Signed)
 Called pt to share with him the information about joining our [facility] and he is very interested. I will be sending a letter out to patient.

## 2024-05-12 ENCOUNTER — Other Ambulatory Visit

## 2024-05-21 ENCOUNTER — Ambulatory Visit: Admitting: Urology

## 2024-08-15 ENCOUNTER — Encounter: Payer: Self-pay | Admitting: Urology

## 2024-08-15 ENCOUNTER — Ambulatory Visit: Admitting: Urology

## 2024-08-15 VITALS — BP 138/67 | HR 80

## 2024-08-15 DIAGNOSIS — C61 Malignant neoplasm of prostate: Secondary | ICD-10-CM | POA: Diagnosis not present

## 2024-08-15 DIAGNOSIS — N390 Urinary tract infection, site not specified: Secondary | ICD-10-CM

## 2024-08-15 DIAGNOSIS — N401 Enlarged prostate with lower urinary tract symptoms: Secondary | ICD-10-CM | POA: Diagnosis not present

## 2024-08-15 DIAGNOSIS — R300 Dysuria: Secondary | ICD-10-CM | POA: Diagnosis not present

## 2024-08-15 DIAGNOSIS — N138 Other obstructive and reflux uropathy: Secondary | ICD-10-CM | POA: Diagnosis not present

## 2024-08-15 MED ORDER — DOXYCYCLINE HYCLATE 100 MG PO CAPS: 100.0000 mg | ORAL_CAPSULE | Freq: Two times a day (BID) | ORAL | 0 refills | Status: AC

## 2024-08-15 MED ORDER — TAMSULOSIN HCL 0.4 MG PO CAPS: 0.4000 mg | ORAL_CAPSULE | Freq: Two times a day (BID) | ORAL | 11 refills | Status: AC

## 2024-08-15 MED ORDER — TADALAFIL 20 MG PO TABS: 20.0000 mg | ORAL_TABLET | ORAL | 5 refills | Status: AC | PRN

## 2024-08-15 NOTE — Addendum Note (Signed)
 Addended by: Taylan Mayhan L on: 08/15/2024 09:46 AM   Modules accepted: Orders

## 2024-08-15 NOTE — Progress Notes (Addendum)
 08/15/2024 9:39 AM   Lynwood GORMAN Ada [birth year] [id number]  Referring provider: Renato Dorothey HERO, NP [phone] US  [address] [place],  [place] [address]  No chief complaint on file.   HPI: Mr Emond is a 73yo here for followup for prostate cancer and BPH. PSA decreased to 0.6. IPSS 18 QOL 4 on flomax  0.4mg  daily. He used to have starting/stopping of his urinary stream but now he has urinary hesitancy and straining to urinate. Nocturia 0-2x. Uirne stream is strong once it starts. He has intermittent dysuria.  He was treated for epididymitis in 5/23. He has issues getting and maintaining an erection. He is off Imdur  and nitroglycerin    PMH: Past Medical History:  Diagnosis Date   Anxiety    Bradycardia    CAD (coronary artery disease)    Chronic anticoagulation    DJD (degenerative joint disease)    DVT (deep venous thrombosis) (HCC) 06/25/2014   ED (erectile dysfunction)    GERD (gastroesophageal reflux disease)    HTN (hypertension)    Polio    right leg/arm weakness   Pulmonary embolism (HCC) 06/25/2014   Somatic dysfunction    Stroke (HCC)    affected vision and speech mildly   Tubular adenoma of colon 2002    Surgical History: Past Surgical History:  Procedure Laterality Date   COLONOSCOPY  2002   Dr. Rehman--> Single diverticulum at the splenic flexure, tiny polyp at transverse colon, tubular adenomatous polyp.   COLONOSCOPY  08/23/2012   Procedure: COLONOSCOPY;  Surgeon: Margo LITTIE Haddock, MD;  [facility];  Service: Endoscopy;  Laterality: N/A;  10:15   COLONOSCOPY WITH PROPOFOL  N/A 10/01/2020   Procedure: COLONOSCOPY WITH PROPOFOL ;  Surgeon: Eartha Angelia Sieving, MD;  [facility];  Service: Gastroenterology;  Laterality: N/A;  7:30 am   LEFT HEART CATH AND CORONARY ANGIOGRAPHY N/A 04/26/2021   Procedure: LEFT HEART CATH AND CORONARY ANGIOGRAPHY;  Surgeon: Jordan, Peter M, MD;  Location: [facility] INVASIVE CV LAB;  Service: Cardiovascular;  Laterality: N/A;    POLYPECTOMY  10/01/2020   Procedure: POLYPECTOMY;  Surgeon: Eartha Angelia Sieving, MD;  [facility];  Service: Gastroenterology;;   RADIOACTIVE SEED IMPLANT N/A 01/10/2024   Procedure: INSERTION, RADIATION SOURCE, PROSTATE;  Surgeon: Sherrilee Belvie LITTIE, MD;  Location: [facility];  Service: Urology;  Laterality: N/A;  68 seeds implanted. No seeds seen in bladder on cystoscopy,   SPACE OAR INSTILLATION N/A 01/10/2024   Procedure: INJECTION, HYDROGEL SPACER;  Surgeon: Sherrilee Belvie LITTIE, MD;  Location: [facility];  Service: Urology;  Laterality: N/A;    Home Medications:  Allergies as of 08/15/2024       Reactions   Codeine Itching   Atorvastatin  Itching        Medication List        Accurate as of August 15, 2024  9:39 AM. If you have any questions, ask your nurse or doctor.          acetaminophen  500 MG tablet Commonly known as: TYLENOL  Take 500 mg by mouth every 6 (six) hours as needed for mild pain or moderate pain.   allopurinol  300 MG tablet Commonly known as: ZYLOPRIM  Take 300 mg by mouth daily.   ascorbic acid 500 MG tablet Commonly known as: VITAMIN C Take 500 mg by mouth daily.   aspirin  EC 81 MG tablet Take 1 tablet (81 mg total) by mouth daily. Swallow whole.   atorvastatin  80 MG tablet Commonly known as: LIPITOR   Take 1 tablet (80 mg total) by mouth daily.   cephALEXin  500 MG capsule Commonly known as: KEFLEX  Take 1 capsule (500 mg total) by mouth 4 (four) times daily.   ergocalciferol 1.25 MG (50000 UT) capsule Commonly known as: VITAMIN D2 Take 50,000 Units by mouth 2 (two) times a week.   ezetimibe  10 MG tablet Commonly known as: ZETIA  Take 1 tablet (10 mg total) by mouth daily.   famotidine  20 MG tablet Commonly known as: PEPCID  TAKE 1 TABLET (20 MG) DAILY IF NEEDED FOR BREAK THROUGH INDIGESTION   Fish Oil 1000 MG Caps Take 1,000 mg by mouth daily.   Garlic 100 MG Tabs Take 100 mg by mouth daily.   isosorbide  mononitrate 30  MG 24 hr tablet Commonly known as: IMDUR  Take 1 tablet (30 mg total) by mouth daily.   meclizine  25 MG tablet Commonly known as: ANTIVERT  Take 1 tablet (25 mg total) by mouth 3 (three) times daily as needed for dizziness.   nitroGLYCERIN  0.4 MG SL tablet Commonly known as: NITROSTAT  Place 1 tablet (0.4 mg total) under the tongue every 5 (five) minutes x 3 doses as needed for chest pain.   pantoprazole  40 MG tablet Commonly known as: PROTONIX  Take 1 tablet (40 mg total) by mouth daily.   rivaroxaban  20 MG Tabs tablet Commonly known as: XARELTO  Take 20 mg by mouth daily with supper.   tamsulosin  0.4 MG Caps capsule Commonly known as: FLOMAX  Take 1 capsule (0.4 mg total) by mouth daily after supper.   telmisartan-hydrochlorothiazide  80-12.5 MG tablet Commonly known as: MICARDIS HCT Take 1 tablet by mouth daily.        Allergies:  Allergies  Allergen Reactions   Codeine Itching   Atorvastatin  Itching    Family History: Family History  Problem Relation Age of Onset   Colon cancer Father        age 5   Heart attack Father        deceased age 44   Liver disease Neg Hx     Social History:  reports that he has quit smoking. He has never been exposed to tobacco smoke. His smokeless tobacco use includes chew. He reports that he does not drink alcohol and does not use drugs.  ROS: All other review of systems were reviewed and are negative except what is noted above in HPI  Physical Exam: BP 138/67   Pulse 80   Constitutional:  Alert and oriented, No acute distress. HEENT: [place] AT, moist mucus membranes.  Trachea midline, no masses. Cardiovascular: No clubbing, cyanosis, or edema. Respiratory: Normal respiratory effort, no increased work of breathing. GI: Abdomen is soft, nontender, nondistended, no abdominal masses GU: No CVA tenderness.  Lymph: No cervical or inguinal lymphadenopathy. Skin: No rashes, bruises or suspicious lesions. Neurologic: Grossly intact, no  focal deficits, moving all 4 extremities. Psychiatric: Normal mood and affect.  Laboratory Data: Lab Results  Component Value Date   WBC 10.3 03/09/2024   HGB 12.5 (L) 03/09/2024   HCT 38.1 (L) 03/09/2024   MCV 90.1 03/09/2024   PLT 292 03/09/2024    Lab Results  Component Value Date   CREATININE 0.99 03/09/2024    No results found for: PSA  No results found for: TESTOSTERONE  Lab Results  Component Value Date   HGBA1C 6.3 (H) 04/26/2021    Urinalysis    Component Value Date/Time   COLORURINE YELLOW 03/09/2024 1517   APPEARANCEUR Clear 03/20/2024 1011   LABSPEC 1.033 (H) 03/09/2024 1517  PHURINE 5.0 03/09/2024 1517   GLUCOSEU Negative 03/20/2024 1011   HGBUR SMALL (A) 03/09/2024 1517   BILIRUBINUR Negative 03/20/2024 1011   KETONESUR NEGATIVE 03/09/2024 1517   PROTEINUR Negative 03/20/2024 1011   PROTEINUR 100 (A) 03/09/2024 1517   UROBILINOGEN 0.2 04/23/2015 1015   NITRITE Negative 03/20/2024 1011   NITRITE NEGATIVE 03/09/2024 1517   LEUKOCYTESUR 1+ (A) 03/20/2024 1011   LEUKOCYTESUR LARGE (A) 03/09/2024 1517    Lab Results  Component Value Date   LABMICR See below: 03/20/2024   WBCUA 6-10 (A) 03/20/2024   LABEPIT 0-10 03/20/2024   MUCUS Present (A) 03/20/2024   BACTERIA None seen 03/20/2024    Pertinent Imaging:  No results found for this or any previous visit.  No results found for this or any previous visit.  No results found for this or any previous visit.  No results found for this or any previous visit.  No results found for this or any previous visit.  No results found for this or any previous visit.  No results found for this or any previous visit.  No results found for this or any previous visit.   Assessment & Plan:    1. Prostate cancer (HCC) (Primary) Followup 6 months with PSA - Urinalysis, Routine w reflex microscopic  2. Benign prostatic hyperplasia with urinary obstruction -increase flomax  to BID  3.  Dysuria Doxycycline 100mg  BID dfor 28 days   No follow-ups on file.  Belvie Clara, MD  [facility] [place]

## 2024-08-15 NOTE — Patient Instructions (Signed)

## 2024-12-31 ENCOUNTER — Ambulatory Visit: Admitting: Cardiology

## 2025-01-12 ENCOUNTER — Ambulatory Visit: Admitting: Cardiology

## 2025-01-22 ENCOUNTER — Other Ambulatory Visit

## 2025-01-28 ENCOUNTER — Ambulatory Visit: Admitting: Urology
